# Patient Record
Sex: Female | Born: 1965
Health system: Southern US, Community
[De-identification: ages and names within clinical notes are randomized; demographics above are authoritative.]

## PROBLEM LIST (undated history)

## (undated) DIAGNOSIS — K2970 Gastritis, unspecified, without bleeding: Secondary | ICD-10-CM

## (undated) DIAGNOSIS — J302 Other seasonal allergic rhinitis: Secondary | ICD-10-CM

## (undated) DIAGNOSIS — K589 Irritable bowel syndrome without diarrhea: Secondary | ICD-10-CM

## (undated) DIAGNOSIS — F419 Anxiety disorder, unspecified: Secondary | ICD-10-CM

## (undated) DIAGNOSIS — Z9889 Other specified postprocedural states: Secondary | ICD-10-CM

## (undated) DIAGNOSIS — B9681 Helicobacter pylori [H. pylori] as the cause of diseases classified elsewhere: Secondary | ICD-10-CM

## (undated) DIAGNOSIS — F329 Major depressive disorder, single episode, unspecified: Secondary | ICD-10-CM

## (undated) DIAGNOSIS — K219 Gastro-esophageal reflux disease without esophagitis: Secondary | ICD-10-CM

## (undated) DIAGNOSIS — F32A Depression, unspecified: Secondary | ICD-10-CM

## (undated) DIAGNOSIS — G43909 Migraine, unspecified, not intractable, without status migrainosus: Secondary | ICD-10-CM

## (undated) DIAGNOSIS — I10 Essential (primary) hypertension: Secondary | ICD-10-CM

## (undated) DIAGNOSIS — R011 Cardiac murmur, unspecified: Secondary | ICD-10-CM

## (undated) HISTORY — PX: CHOLECYSTECTOMY: SHX55

## (undated) HISTORY — DX: Migraine, unspecified, not intractable, without status migrainosus: G43.909

## (undated) HISTORY — DX: Cardiac murmur, unspecified: R01.1

## (undated) HISTORY — DX: Other specified postprocedural states: Z98.890

## (undated) HISTORY — DX: Gastritis, unspecified, without bleeding: K29.70

## (undated) HISTORY — PX: ABDOMINAL HYSTERECTOMY: SHX81

## (undated) HISTORY — DX: Helicobacter pylori (H. pylori) as the cause of diseases classified elsewhere: B96.81

## (undated) HISTORY — DX: Other seasonal allergic rhinitis: J30.2

## (undated) HISTORY — DX: Irritable bowel syndrome, unspecified: K58.9

## (undated) HISTORY — DX: Gastro-esophageal reflux disease without esophagitis: K21.9

---

## 1993-12-04 HISTORY — PX: TUBAL LIGATION: SHX77

## 2000-03-30 ENCOUNTER — Other Ambulatory Visit: Admission: RE | Admit: 2000-03-30 | Discharge: 2000-03-30 | Payer: Self-pay | Admitting: Obstetrics and Gynecology

## 2000-04-02 ENCOUNTER — Other Ambulatory Visit: Admission: RE | Admit: 2000-04-02 | Discharge: 2000-04-02 | Payer: Self-pay | Admitting: Obstetrics and Gynecology

## 2000-04-02 ENCOUNTER — Encounter (INDEPENDENT_AMBULATORY_CARE_PROVIDER_SITE_OTHER): Payer: Self-pay | Admitting: Specialist

## 2000-04-09 ENCOUNTER — Ambulatory Visit (HOSPITAL_COMMUNITY): Admission: RE | Admit: 2000-04-09 | Discharge: 2000-04-09 | Payer: Self-pay | Admitting: Obstetrics and Gynecology

## 2000-04-09 ENCOUNTER — Encounter: Payer: Self-pay | Admitting: Obstetrics and Gynecology

## 2000-05-15 ENCOUNTER — Inpatient Hospital Stay (HOSPITAL_COMMUNITY): Admission: RE | Admit: 2000-05-15 | Discharge: 2000-05-17 | Payer: Self-pay | Admitting: Obstetrics and Gynecology

## 2000-05-15 ENCOUNTER — Encounter (INDEPENDENT_AMBULATORY_CARE_PROVIDER_SITE_OTHER): Payer: Self-pay | Admitting: Specialist

## 2000-12-04 HISTORY — PX: PARTIAL HYSTERECTOMY: SHX80

## 2002-12-23 ENCOUNTER — Ambulatory Visit (HOSPITAL_COMMUNITY): Admission: RE | Admit: 2002-12-23 | Discharge: 2002-12-23 | Payer: Self-pay | Admitting: Pulmonary Disease

## 2005-02-27 ENCOUNTER — Ambulatory Visit: Payer: Self-pay | Admitting: Family Medicine

## 2006-02-05 ENCOUNTER — Ambulatory Visit: Payer: Self-pay | Admitting: Family Medicine

## 2006-11-08 ENCOUNTER — Ambulatory Visit: Payer: Self-pay | Admitting: Family Medicine

## 2006-11-12 ENCOUNTER — Ambulatory Visit (HOSPITAL_COMMUNITY): Admission: RE | Admit: 2006-11-12 | Discharge: 2006-11-12 | Payer: Self-pay | Admitting: Family Medicine

## 2006-12-04 DIAGNOSIS — Z9889 Other specified postprocedural states: Secondary | ICD-10-CM

## 2006-12-04 HISTORY — DX: Other specified postprocedural states: Z98.890

## 2007-01-21 ENCOUNTER — Ambulatory Visit: Payer: Self-pay | Admitting: Family Medicine

## 2007-05-09 ENCOUNTER — Ambulatory Visit: Payer: Self-pay | Admitting: Family Medicine

## 2007-06-21 ENCOUNTER — Ambulatory Visit: Payer: Self-pay | Admitting: Family Medicine

## 2007-06-21 ENCOUNTER — Ambulatory Visit (HOSPITAL_COMMUNITY): Admission: RE | Admit: 2007-06-21 | Discharge: 2007-06-21 | Payer: Self-pay | Admitting: Family Medicine

## 2007-06-26 ENCOUNTER — Ambulatory Visit (HOSPITAL_COMMUNITY): Admission: RE | Admit: 2007-06-26 | Discharge: 2007-06-26 | Payer: Self-pay | Admitting: Family Medicine

## 2007-06-27 ENCOUNTER — Ambulatory Visit (HOSPITAL_COMMUNITY): Admission: RE | Admit: 2007-06-27 | Discharge: 2007-06-27 | Payer: Self-pay | Admitting: Oncology

## 2007-07-09 ENCOUNTER — Ambulatory Visit: Payer: Self-pay | Admitting: Family Medicine

## 2007-10-22 ENCOUNTER — Ambulatory Visit: Payer: Self-pay | Admitting: Family Medicine

## 2007-10-22 LAB — CONVERTED CEMR LAB
BUN: 7 mg/dL (ref 6–23)
Basophils Absolute: 0 10*3/uL (ref 0.0–0.1)
CO2: 29 meq/L (ref 19–32)
Cholesterol: 185 mg/dL (ref 0–200)
Glucose, Bld: 82 mg/dL (ref 70–99)
Lymphocytes Relative: 39 % (ref 12–46)
Lymphs Abs: 1.7 10*3/uL (ref 0.7–4.0)
MCHC: 33.7 g/dL (ref 30.0–36.0)
MCV: 91.4 fL (ref 78.0–100.0)
Monocytes Absolute: 0.3 10*3/uL (ref 0.1–1.0)
Monocytes Relative: 7 % (ref 3–12)
Neutro Abs: 2.2 10*3/uL (ref 1.7–7.7)
Neutrophils Relative %: 49 % (ref 43–77)
Total CHOL/HDL Ratio: 3.1
VLDL: 10 mg/dL (ref 0–40)

## 2007-10-29 ENCOUNTER — Encounter: Payer: Self-pay | Admitting: Family Medicine

## 2007-11-08 ENCOUNTER — Ambulatory Visit: Payer: Self-pay | Admitting: Gastroenterology

## 2007-11-13 ENCOUNTER — Ambulatory Visit: Payer: Self-pay | Admitting: Gastroenterology

## 2007-11-13 ENCOUNTER — Ambulatory Visit (HOSPITAL_COMMUNITY): Admission: RE | Admit: 2007-11-13 | Discharge: 2007-11-13 | Payer: Self-pay | Admitting: Gastroenterology

## 2007-11-13 ENCOUNTER — Encounter: Payer: Self-pay | Admitting: Gastroenterology

## 2007-11-13 HISTORY — PX: COLONOSCOPY: SHX174

## 2007-11-15 DIAGNOSIS — J45909 Unspecified asthma, uncomplicated: Secondary | ICD-10-CM | POA: Insufficient documentation

## 2007-12-05 DIAGNOSIS — B9681 Helicobacter pylori [H. pylori] as the cause of diseases classified elsewhere: Secondary | ICD-10-CM

## 2007-12-05 HISTORY — DX: Helicobacter pylori (H. pylori) as the cause of diseases classified elsewhere: B96.81

## 2008-01-16 ENCOUNTER — Ambulatory Visit: Payer: Self-pay | Admitting: Gastroenterology

## 2008-02-05 ENCOUNTER — Ambulatory Visit: Payer: Self-pay | Admitting: Family Medicine

## 2008-02-05 ENCOUNTER — Ambulatory Visit (HOSPITAL_COMMUNITY): Admission: RE | Admit: 2008-02-05 | Discharge: 2008-02-05 | Payer: Self-pay | Admitting: Family Medicine

## 2008-02-05 LAB — CONVERTED CEMR LAB
Basophils Absolute: 0 10*3/uL (ref 0.0–0.1)
CO2: 29 meq/L (ref 19–32)
Chloride: 98 meq/L (ref 96–112)
Creatinine, Ser: 0.8 mg/dL (ref 0.40–1.20)
Eosinophils Absolute: 0 10*3/uL (ref 0.0–0.7)
Eosinophils Relative: 0 % (ref 0–5)
Glucose, Bld: 126 mg/dL — ABNORMAL HIGH (ref 70–99)
HCT: 38 % (ref 36.0–46.0)
Hemoglobin: 13.1 g/dL (ref 12.0–15.0)
MCV: 90 fL (ref 78.0–100.0)
Potassium: 3.8 meq/L (ref 3.5–5.3)
RBC: 4.22 M/uL (ref 3.87–5.11)
RDW: 13.6 % (ref 11.5–15.5)
Sodium: 137 meq/L (ref 135–145)
WBC: 5.5 10*3/uL (ref 4.0–10.5)

## 2008-03-17 ENCOUNTER — Ambulatory Visit: Payer: Self-pay | Admitting: Gastroenterology

## 2008-03-20 ENCOUNTER — Ambulatory Visit: Payer: Self-pay | Admitting: Gastroenterology

## 2008-03-20 ENCOUNTER — Encounter: Payer: Self-pay | Admitting: Gastroenterology

## 2008-03-20 ENCOUNTER — Ambulatory Visit (HOSPITAL_COMMUNITY): Admission: RE | Admit: 2008-03-20 | Discharge: 2008-03-20 | Payer: Self-pay | Admitting: Gastroenterology

## 2008-03-20 HISTORY — PX: ESOPHAGOGASTRODUODENOSCOPY: SHX1529

## 2008-05-13 ENCOUNTER — Ambulatory Visit: Payer: Self-pay | Admitting: Gastroenterology

## 2008-06-11 ENCOUNTER — Ambulatory Visit: Payer: Self-pay | Admitting: Family Medicine

## 2008-07-09 DIAGNOSIS — J301 Allergic rhinitis due to pollen: Secondary | ICD-10-CM | POA: Insufficient documentation

## 2008-10-06 ENCOUNTER — Encounter: Payer: Self-pay | Admitting: Family Medicine

## 2008-10-13 ENCOUNTER — Ambulatory Visit: Payer: Self-pay | Admitting: Family Medicine

## 2008-10-13 DIAGNOSIS — E785 Hyperlipidemia, unspecified: Secondary | ICD-10-CM | POA: Insufficient documentation

## 2008-10-13 DIAGNOSIS — R1013 Epigastric pain: Secondary | ICD-10-CM | POA: Insufficient documentation

## 2008-10-16 ENCOUNTER — Encounter (HOSPITAL_COMMUNITY): Admission: RE | Admit: 2008-10-16 | Discharge: 2008-11-15 | Payer: Self-pay | Admitting: Family Medicine

## 2008-10-21 ENCOUNTER — Encounter: Payer: Self-pay | Admitting: Family Medicine

## 2008-11-19 ENCOUNTER — Ambulatory Visit (HOSPITAL_COMMUNITY): Admission: RE | Admit: 2008-11-19 | Discharge: 2008-11-19 | Payer: Self-pay | Admitting: Family Medicine

## 2009-03-23 ENCOUNTER — Ambulatory Visit: Payer: Self-pay | Admitting: Family Medicine

## 2009-03-24 ENCOUNTER — Encounter: Payer: Self-pay | Admitting: Family Medicine

## 2009-03-24 LAB — CONVERTED CEMR LAB
Calcium: 9 mg/dL (ref 8.4–10.5)
Chloride: 106 meq/L (ref 96–112)
Creatinine, Ser: 0.79 mg/dL (ref 0.40–1.20)
Sodium: 142 meq/L (ref 135–145)
Triglycerides: 77 mg/dL (ref <150)

## 2009-07-26 ENCOUNTER — Ambulatory Visit: Payer: Self-pay | Admitting: Family Medicine

## 2009-07-26 DIAGNOSIS — R5383 Other fatigue: Secondary | ICD-10-CM

## 2009-07-26 DIAGNOSIS — R634 Abnormal weight loss: Secondary | ICD-10-CM | POA: Insufficient documentation

## 2009-07-26 DIAGNOSIS — R5381 Other malaise: Secondary | ICD-10-CM | POA: Insufficient documentation

## 2009-07-27 LAB — CONVERTED CEMR LAB
Basophils Absolute: 0 10*3/uL (ref 0.0–0.1)
Basophils Relative: 1 % (ref 0–1)
Eosinophils Absolute: 0.3 10*3/uL (ref 0.0–0.7)
Lymphocytes Relative: 37 % (ref 12–46)
MCV: 90.7 fL (ref 78.0–100.0)
Monocytes Relative: 9 % (ref 3–12)
Neutrophils Relative %: 46 % (ref 43–77)
Platelets: 177 10*3/uL (ref 150–400)
RBC: 4.51 M/uL (ref 3.87–5.11)
RDW: 13.2 % (ref 11.5–15.5)
TSH: 1.302 microintl units/mL (ref 0.350–4.500)
WBC: 4.4 10*3/uL (ref 4.0–10.5)

## 2009-08-27 ENCOUNTER — Encounter: Payer: Self-pay | Admitting: Family Medicine

## 2009-10-13 ENCOUNTER — Encounter: Payer: Self-pay | Admitting: Family Medicine

## 2009-10-25 ENCOUNTER — Ambulatory Visit (HOSPITAL_COMMUNITY): Admission: RE | Admit: 2009-10-25 | Discharge: 2009-10-25 | Payer: Self-pay | Admitting: General Surgery

## 2009-11-09 ENCOUNTER — Encounter: Payer: Self-pay | Admitting: Family Medicine

## 2009-11-30 ENCOUNTER — Telehealth: Payer: Self-pay | Admitting: Family Medicine

## 2009-12-20 ENCOUNTER — Ambulatory Visit: Payer: Self-pay | Admitting: Family Medicine

## 2009-12-23 ENCOUNTER — Ambulatory Visit (HOSPITAL_COMMUNITY): Admission: RE | Admit: 2009-12-23 | Discharge: 2009-12-23 | Payer: Self-pay | Admitting: Family Medicine

## 2009-12-26 DIAGNOSIS — R7302 Impaired glucose tolerance (oral): Secondary | ICD-10-CM | POA: Insufficient documentation

## 2009-12-29 ENCOUNTER — Ambulatory Visit (HOSPITAL_COMMUNITY): Admission: RE | Admit: 2009-12-29 | Discharge: 2009-12-29 | Payer: Self-pay | Admitting: Family Medicine

## 2009-12-29 ENCOUNTER — Encounter: Payer: Self-pay | Admitting: Family Medicine

## 2009-12-30 ENCOUNTER — Encounter: Payer: Self-pay | Admitting: Family Medicine

## 2010-01-10 ENCOUNTER — Telehealth: Payer: Self-pay | Admitting: Family Medicine

## 2010-01-11 ENCOUNTER — Emergency Department (HOSPITAL_COMMUNITY): Admission: EM | Admit: 2010-01-11 | Discharge: 2010-01-11 | Payer: Self-pay | Admitting: Emergency Medicine

## 2010-01-19 ENCOUNTER — Ambulatory Visit: Payer: Self-pay | Admitting: Family Medicine

## 2010-01-19 DIAGNOSIS — J019 Acute sinusitis, unspecified: Secondary | ICD-10-CM

## 2010-01-19 DIAGNOSIS — G43909 Migraine, unspecified, not intractable, without status migrainosus: Secondary | ICD-10-CM | POA: Insufficient documentation

## 2010-01-19 DIAGNOSIS — B9689 Other specified bacterial agents as the cause of diseases classified elsewhere: Secondary | ICD-10-CM | POA: Insufficient documentation

## 2010-01-20 ENCOUNTER — Encounter: Payer: Self-pay | Admitting: Family Medicine

## 2010-03-01 ENCOUNTER — Ambulatory Visit: Payer: Self-pay | Admitting: Family Medicine

## 2010-03-01 ENCOUNTER — Encounter: Payer: Self-pay | Admitting: Physician Assistant

## 2010-04-19 ENCOUNTER — Ambulatory Visit: Payer: Self-pay | Admitting: Family Medicine

## 2010-04-19 ENCOUNTER — Encounter (INDEPENDENT_AMBULATORY_CARE_PROVIDER_SITE_OTHER): Payer: Self-pay

## 2010-05-02 DIAGNOSIS — J209 Acute bronchitis, unspecified: Secondary | ICD-10-CM | POA: Insufficient documentation

## 2010-06-17 ENCOUNTER — Emergency Department (HOSPITAL_COMMUNITY): Admission: EM | Admit: 2010-06-17 | Discharge: 2010-06-17 | Payer: Self-pay | Admitting: Emergency Medicine

## 2010-06-24 ENCOUNTER — Encounter: Payer: Self-pay | Admitting: Physician Assistant

## 2010-06-24 ENCOUNTER — Ambulatory Visit: Payer: Self-pay | Admitting: Family Medicine

## 2010-06-27 ENCOUNTER — Encounter: Payer: Self-pay | Admitting: Physician Assistant

## 2010-06-27 ENCOUNTER — Ambulatory Visit: Payer: Self-pay | Admitting: Family Medicine

## 2010-09-14 ENCOUNTER — Ambulatory Visit: Payer: Self-pay | Admitting: Family Medicine

## 2010-09-14 DIAGNOSIS — M949 Disorder of cartilage, unspecified: Secondary | ICD-10-CM

## 2010-09-14 DIAGNOSIS — M899 Disorder of bone, unspecified: Secondary | ICD-10-CM | POA: Insufficient documentation

## 2010-10-03 ENCOUNTER — Encounter: Payer: Self-pay | Admitting: Family Medicine

## 2010-10-10 ENCOUNTER — Encounter: Payer: Self-pay | Admitting: Family Medicine

## 2010-10-24 ENCOUNTER — Emergency Department (HOSPITAL_COMMUNITY)
Admission: EM | Admit: 2010-10-24 | Discharge: 2010-10-25 | Payer: Self-pay | Source: Home / Self Care | Admitting: Emergency Medicine

## 2010-11-03 ENCOUNTER — Telehealth: Payer: Self-pay | Admitting: Family Medicine

## 2010-12-19 ENCOUNTER — Ambulatory Visit
Admission: RE | Admit: 2010-12-19 | Discharge: 2010-12-19 | Payer: Self-pay | Source: Home / Self Care | Attending: Urgent Care | Admitting: Urgent Care

## 2010-12-19 DIAGNOSIS — K589 Irritable bowel syndrome without diarrhea: Secondary | ICD-10-CM | POA: Insufficient documentation

## 2010-12-25 ENCOUNTER — Encounter: Payer: Self-pay | Admitting: Family Medicine

## 2011-01-02 ENCOUNTER — Ambulatory Visit (HOSPITAL_COMMUNITY)
Admission: RE | Admit: 2011-01-02 | Discharge: 2011-01-02 | Payer: Self-pay | Source: Home / Self Care | Attending: Family Medicine | Admitting: Family Medicine

## 2011-01-03 NOTE — Letter (Signed)
Summary: med review sheet  med review sheet   Imported By: Rudene Anda 09/14/2010 08:50:09  _____________________________________________________________________  External Attachment:    Type:   Image     Comment:   External Document

## 2011-01-03 NOTE — Progress Notes (Signed)
Summary: ALLERGY & ASTHMA  ALLERGY & ASTHMA   Imported By: Lind Guest 01/25/2010 09:34:07  _____________________________________________________________________  External Attachment:    Type:   Image     Comment:   External Document

## 2011-01-03 NOTE — Assessment & Plan Note (Signed)
Summary: follow up asthma- room 1   Vital Signs:  Patient profile:   45 year old female Menstrual status:  regular Height:      65.5 inches Weight:      125.75 pounds BMI:     20.68 O2 Sat:      99 % on Room air Pulse rate:   66 / minute Resp:     16 per minute BP sitting:   144 / 90  (left arm)  Vitals Entered By: Adella Hare LPN (June 27, 2010 8:43 AM) CC: follow up asthma Is Patient Diabetic? No Pain Assessment Patient in pain? no      Comments did not bring meds to ov   Primary Provider:  Syliva Overman MD  CC:  follow up asthma.  History of Present Illness: Pt is here for f/u on her asthma exacerbation. She states she is doing better.  Is not awakening at night now due to breathing.  She is still using her Albuterol every 4 hrs though she no longer feels like she needs to.  She is using her Symbicort two times a day as prescribed.  She feels that she is able to rtw tonight as planned.     Allergies (verified): No Known Drug Allergies  Review of Systems ENT:  Denies nasal congestion, postnasal drainage, and sinus pressure. CV:  Denies chest pain or discomfort. Resp:  Denies cough, shortness of breath, and wheezing.  Physical Exam  General:  Well-developed,well-nourished,in no acute distress; alert,appropriate and cooperative throughout examination Head:  Normocephalic and atraumatic without obvious abnormalities. No apparent alopecia or balding. Ears:  External ear exam shows no significant lesions or deformities.  Otoscopic examination reveals clear canals, tympanic membranes are intact bilaterally without bulging, retraction, inflammation or discharge. Hearing is grossly normal bilaterally. Nose:  External nasal examination shows no deformity or inflammation. Nasal mucosa are pink and moist without lesions or exudates. Mouth:  Oral mucosa and oropharynx without lesions or exudates.  Teeth in good repair. Neck:  No deformities, masses, or tenderness  noted. Lungs:  Normal respiratory effort, chest expands symmetrically. Lungs are clear to auscultation, no crackles or wheezes. Heart:  Normal rate and regular rhythm. S1 and S2 normal without gallop, murmur, click, rub or other extra sounds. Cervical Nodes:  No lymphadenopathy noted Psych:  Cognition and judgment appear intact. Alert and cooperative with normal attention span and concentration. No apparent delusions, illusions, hallucinations   Impression & Recommendations:  Problem # 1:  ASTHMA, WITH ACUTE EXACERBATION (ICD-493.92) Assessment Improved Resume using Proventil as needed.  Her updated medication list for this problem includes:    Proventil Hfa 108 (90 Base) Mcg/act Aers (Albuterol sulfate) .Marland Kitchen..Marland Kitchen Two puffs every 6-8 hours as needed    Symbicort 160-4.5 Mcg/act Aero (Budesonide-formoterol fumarate) .Marland Kitchen... 2 puffs twice daily    Singulair 10 Mg Tabs (Montelukast sodium) .Marland Kitchen... Take 1 tablet by mouth once a day    Prednisone 20 Mg Tabs (Prednisone) .Marland Kitchen... Take 2 daily x 4 days  Complete Medication List: 1)  Proventil Hfa 108 (90 Base) Mcg/act Aers (Albuterol sulfate) .... Two puffs every 6-8 hours as needed 2)  Veramyst 27.5 Mcg/spray Susp (Fluticasone furoate) .... Two sprays each nostril bid 3)  Symbicort 160-4.5 Mcg/act Aero (Budesonide-formoterol fumarate) .... 2 puffs twice daily 4)  Singulair 10 Mg Tabs (Montelukast sodium) .... Take 1 tablet by mouth once a day 5)  Zyprexa 2.5 Mg Tabs (Olanzapine) .... Take 1 tab by mouth at bedtime 6)  Ibuprofen 600  Mg Tabs (Ibuprofen) .... One twicwe daily as needed for severe headache 7)  Tessalon Perles 100 Mg Caps (Benzonatate) .... Take 1 capsule by mouth three times a day 8)  Prednisone 20 Mg Tabs (Prednisone) .... Take 2 daily x 4 days  Patient Instructions: 1)  Keep your next appt with Dr Lodema Hong.  We will see you sooner if your asthma syptoms begin to worsen again. 2)  Return to using your Albuterol inhaler as needed. 3)   Continue using Symbicort two times a day.

## 2011-01-03 NOTE — Assessment & Plan Note (Signed)
Summary: office visit   Vital Signs:  Patient profile:   45 year old female Menstrual status:  regular Height:      65.5 inches Weight:      116 pounds BMI:     19.08 O2 Sat:      98 % Pulse rate:   68 / minute Pulse rhythm:   regular Resp:     16 per minute BP sitting:   140 / 90  (left arm) Cuff size:   regular  Vitals Entered By: Everitt Amber LPN (Apr 19, 2010 10:15 AM) CC: Follow up chornic problems, keeps losing weight even though she tries to gain   Primary Care Provider:  Syliva Overman MD  CC:  Follow up chornic problems and keeps losing weight even though she tries to gain.  History of Present Illness: P tcomes in concerned about persistent weight loss with symptoms of depression. She is neither suicidal or homicidal and denies hallucinations. She has no change in bowel movements. She has increased wheeze , and cough productive of clearsputum over the past several days.She has had t no fever or chills   Current Medications (verified): 1)  Proventil Hfa 108 (90 Base) Mcg/act Aers (Albuterol Sulfate) .... Two Puffs Every 6-8 Hours As Needed 2)  Veramyst 27.5 Mcg/spray Susp (Fluticasone Furoate) .... Two Sprays Each Nostril Bid 3)  Symbicort 160-4.5 Mcg/act Aero (Budesonide-Formoterol Fumarate) .... 2 Puffs Twice Daily 4)  Singulair 10 Mg Tabs (Montelukast Sodium) .... Take 1 Tablet By Mouth Once A Day  Allergies (verified): No Known Drug Allergies  Review of Systems      See HPI General:  Complains of sleep disorder; poor sleep  x 2 weeks on avg less than 4 hrs/night. Eyes:  Denies blurring and discharge. ENT:  Denies hoarseness, nasal congestion, postnasal drainage, and sinus pressure. CV:  Denies chest pain or discomfort, palpitations, and swelling of feet. Resp:  Complains of cough, shortness of breath, sputum productive, and wheezing. GI:  Denies abdominal pain, constipation, diarrhea, nausea, and vomiting. GU:  Denies dysuria and urinary frequency. MS:   Denies joint pain and stiffness. Neuro:  Complains of headaches; right sided throbbing headache with nausea which wa disabling on this past Sunday, May 15 , had to lv wk and has not been back yet, has used advil with some relieft , needs wk excuse to return tomorrow, has been wking 7 days straight for several mths. Psych:  poor sleep, feeling depressed eating all day has bm constantly since cholecystectomy, , wants med to help her regain weight. Endo:  Denies cold intolerance, excessive hunger, and excessive thirst. Heme:  Denies abnormal bruising and bleeding. Allergy:  Denies hives or rash and itching eyes.  Physical Exam  General:  Well-developed,well-nourished,in no acute distress; alert,appropriate and cooperative throughout examination HEENT: No facial asymmetry,  EOMI, No sinus tenderness, TM's Clear, oropharynx  pink and moist.   Chest:decreased air entry, bilateral wheezes and crackles CVS: S1, S2, No murmurs, No S3.   Abd: Soft, Nontender.  MS: Adequate ROM spine, hips, shoulders and knees.  Ext: No edema.   CNS: CN 2-12 intact, power tone and sensation normal throughout.   Skin: Intact, no visible lesions or rashes.  Psych: Good eye contact, normal affect.  Memory intact, depressed appearing.    Impression & Recommendations:  Problem # 1:  ASTHMA (ICD-493.90) Assessment Deteriorated  Her updated medication list for this problem includes:    Proventil Hfa 108 (90 Base) Mcg/act Aers (Albuterol sulfate) .Marland Kitchen..Marland Kitchen Two  puffs every 6-8 hours as needed    Symbicort 160-4.5 Mcg/act Aero (Budesonide-formoterol fumarate) .Marland Kitchen... 2 puffs twice daily    Singulair 10 Mg Tabs (Montelukast sodium) .Marland Kitchen... Take 1 tablet by mouth once a day  Problem # 2:  ACUTE BRONCHITIS (ICD-466.0) Assessment: Deteriorated  Her updated medication list for this problem includes:    Proventil Hfa 108 (90 Base) Mcg/act Aers (Albuterol sulfate) .Marland Kitchen..Marland Kitchen Two puffs every 6-8 hours as needed    Symbicort 160-4.5  Mcg/act Aero (Budesonide-formoterol fumarate) .Marland Kitchen... 2 puffs twice daily    Singulair 10 Mg Tabs (Montelukast sodium) .Marland Kitchen... Take 1 tablet by mouth once a day    Tessalon Perles 100 Mg Caps (Benzonatate) .Marland Kitchen... Take 1 capsule by mouth three times a day  Problem # 3:  WEIGHT LOSS (ICD-783.21) Assessment: Deteriorated has responded in the past to zyprexa and does have some depression symptoms  Complete Medication List: 1)  Proventil Hfa 108 (90 Base) Mcg/act Aers (Albuterol sulfate) .... Two puffs every 6-8 hours as needed 2)  Veramyst 27.5 Mcg/spray Susp (Fluticasone furoate) .... Two sprays each nostril bid 3)  Symbicort 160-4.5 Mcg/act Aero (Budesonide-formoterol fumarate) .... 2 puffs twice daily 4)  Singulair 10 Mg Tabs (Montelukast sodium) .... Take 1 tablet by mouth once a day 5)  Zyprexa 2.5 Mg Tabs (Olanzapine) .... Take 1 tab by mouth at bedtime 6)  Ibuprofen 600 Mg Tabs (Ibuprofen) .... One twicwe daily as needed for severe headache 7)  Tessalon Perles 100 Mg Caps (Benzonatate) .... Take 1 capsule by mouth three times a day  Patient Instructions: 1)  Please schedule a follow-up appointment in 3 months. 2)  You are being treated for acute broncitis, uncontrolled allergies, and asthma flare. 3)  Pls call ifyousdevelop cills , fever or yellow sputum in the next week. 4)  Meds are sent in and you will be providied with a work excuse to return tomorrow. 5)  Get plenty of rest, drink lots of clear liquids, and use Tylenol or Ibuprofen for fever and comfort. Return in 7-10 days if you're not better:sooner if you're feeling worse. Prescriptions: TESSALON PERLES 100 MG CAPS (BENZONATATE) Take 1 capsule by mouth three times a day  #21 x 0   Entered and Authorized by:   Syliva Overman MD   Signed by:   Syliva Overman MD on 04/19/2010   Method used:   Electronically to        [facility] [phone]* (retail)       [address]       [place], [place]  [address]        Ph: [phone] or [phone]       Fax: [phone]   RxID:   [id number] IBUPROFEN 600 MG TABS (IBUPROFEN) one twicwe daily as needed for severe headache  #40 x 0   Entered and Authorized by:   Syliva Overman MD   Signed by:   Syliva Overman MD on 04/19/2010   Method used:   Electronically to        [facility] [phone]* (retail)       [address]       [place], [place]  [address]       Ph: [phone] or [phone]       Fax: [phone]   RxID:   [id number] PREDNISONE (PAK) 5 MG TABS (PREDNISONE) Use as directed  #21 x 0   Entered  and Authorized by:   Syliva Overman MD   Signed by:   Syliva Overman MD on 04/19/2010   Method used:   Electronically to        [facility] [phone]* (retail)       [address]       [place], [place]  [address]       Ph: [phone] or [phone]       Fax: [phone]   RxID:   [id number] ZYPREXA 2.5 MG TABS (OLANZAPINE) Take 1 tab by mouth at bedtime  #30 x 2   Entered and Authorized by:   Syliva Overman MD   Signed by:   Syliva Overman MD on 04/19/2010   Method used:   Electronically to        [facility] [phone]* (retail)       [address]       [place], [place]  [address]       Ph: [phone] or [phone]       Fax: [phone]   RxID:   [id number]

## 2011-01-03 NOTE — Letter (Signed)
Summary: Out of Work  [facility]  [address]   [place], [place] [address]   Phone: [phone]  Fax: [phone]    June 24, 2010   Employee:  TANZIE ROTHSCHILD    To Whom It May Concern:   For Medical reasons, please excuse the above named employee from work for the following dates:  Start:   06-23-10  End:   06-27-10 may return to work  If you need additional information, please feel free to contact our office.         Sincerely,    Esperanza Sheets PA

## 2011-01-03 NOTE — Assessment & Plan Note (Signed)
Summary: asthma flare up- room 1   Vital Signs:  Patient profile:   45 year old female Menstrual status:  regular Height:      65.5 inches Weight:      122.75 pounds BMI:     20.19 O2 Sat:      97 % on Room air Pulse rate:   72 / minute Resp:     16 per minute BP sitting:   146 / 90  (left arm)  Vitals Entered By: Adella Hare LPN (June 24, 2010 9:06 AM) CC: asthma flare up Is Patient Diabetic? No Pain Assessment Patient in pain? no        Primary Provider:  Syliva Overman MD  CC:  asthma flare up.  History of Present Illness: Pt presents today with c/o asthma flare up.  She states she was at work last Fri 7/15 and had an asthma attack. She used her inhaler 3 times without improvement so went to the ER.  They prescribed Prednisone 20mg  once daily x 5 days and Zpak. Pt states she still is having an increase in wheezing and difficulty breathing.  She is using her Proventil inhaler 2-3 times a day as needed.  No fever or chills.  No allergy syptoms. No sinus congestion, pressure, or chest congestion. Hx of asthma flare with certain smoke exposures at work, but hasnt been exposed recently.  Current Medications (verified): 1)  Proventil Hfa 108 (90 Base) Mcg/act Aers (Albuterol Sulfate) .... Two Puffs Every 6-8 Hours As Needed 2)  Veramyst 27.5 Mcg/spray Susp (Fluticasone Furoate) .... Two Sprays Each Nostril Bid 3)  Symbicort 160-4.5 Mcg/act Aero (Budesonide-Formoterol Fumarate) .... 2 Puffs Twice Daily 4)  Singulair 10 Mg Tabs (Montelukast Sodium) .... Take 1 Tablet By Mouth Once A Day 5)  Zyprexa 2.5 Mg Tabs (Olanzapine) .... Take 1 Tab By Mouth At Bedtime 6)  Ibuprofen 600 Mg Tabs (Ibuprofen) .... One Twicwe Daily As Needed For Severe Headache 7)  Tessalon Perles 100 Mg Caps (Benzonatate) .... Take 1 Capsule By Mouth Three Times A Day  Allergies (verified): No Known Drug Allergies  Past History:  Past medical history reviewed for relevance to current acute and chronic  problems.  Past Medical History: Reviewed history from 01/19/2010 and no changes required. Current Problems:  ASTHMA (ICD-493.90)  dx 2008 ALLERGIC RHINITIS, SEASONAL (ICD-477.0) Migraine headaches, initially at age 56, recurrence for the first time in 10 yrs in 2011  Review of Systems General:  Denies chills and fever. ENT:  Denies earache, nasal congestion, and sore throat. CV:  Denies chest pain or discomfort. Resp:  Complains of shortness of breath and wheezing; denies cough and sputum productive. Allergy:  Complains of sneezing; denies itching eyes and seasonal allergies; SNEEZES EVERY MORNING WHEN AWAKENS, NO CHANGE.  Physical Exam  General:  Well-developed,well-nourished,in no acute distress; alert,appropriate and cooperative throughout examination Head:  Normocephalic and atraumatic without obvious abnormalities. No apparent alopecia or balding. Ears:  External ear exam shows no significant lesions or deformities.  Otoscopic examination reveals clear canals, tympanic membranes are intact bilaterally without bulging, retraction, inflammation or discharge. Hearing is grossly normal bilaterally. Nose:  Turbs bilat severly swollen.no external deformity, no nasal discharge, and no sinus percussion tenderness.   Mouth:  Oral mucosa and oropharynx without lesions or exudates.  Teeth in good repair. Neck:  No deformities, masses, or tenderness noted. Lungs:  normal respiratory effort and no intercostal retractions.  Tight bilat without wheeze, rhonci or rales.  After NMT CTA with good  a/e. Heart:  Normal rate and regular rhythm. S1 and S2 normal without gallop, murmur, click, rub or other extra sounds. Cervical Nodes:  No lymphadenopathy noted Psych:  Cognition and judgment appear intact. Alert and cooperative with normal attention span and concentration. No apparent delusions, illusions, hallucinations   Impression & Recommendations:  Problem # 1:  ASTHMA, WITH ACUTE EXACERBATION  (ICD-493.92) Assessment Deteriorated  Her updated medication list for this problem includes:    Proventil Hfa 108 (90 Base) Mcg/act Aers (Albuterol sulfate) .Marland Kitchen..Marland Kitchen Two puffs every 6-8 hours as needed    Symbicort 160-4.5 Mcg/act Aero (Budesonide-formoterol fumarate) .Marland Kitchen... 2 puffs twice daily    Singulair 10 Mg Tabs (Montelukast sodium) .Marland Kitchen... Take 1 tablet by mouth once a day    Prednisone 20 Mg Tabs (Prednisone) .Marland Kitchen... Take 2 daily x 4 days  Orders: Albuterol Sulfate Sol 1mg  unit dose (E4540) Nebulizer Tx (98119)  Problem # 2:  ALLERGIC RHINITIS, SEASONAL (ICD-477.0) Assessment: Unchanged Encouraged pt to use Veramyst regularly.  Complete Medication List: 1)  Proventil Hfa 108 (90 Base) Mcg/act Aers (Albuterol sulfate) .... Two puffs every 6-8 hours as needed 2)  Veramyst 27.5 Mcg/spray Susp (Fluticasone furoate) .... Two sprays each nostril bid 3)  Symbicort 160-4.5 Mcg/act Aero (Budesonide-formoterol fumarate) .... 2 puffs twice daily 4)  Singulair 10 Mg Tabs (Montelukast sodium) .... Take 1 tablet by mouth once a day 5)  Zyprexa 2.5 Mg Tabs (Olanzapine) .... Take 1 tab by mouth at bedtime 6)  Ibuprofen 600 Mg Tabs (Ibuprofen) .... One twicwe daily as needed for severe headache 7)  Tessalon Perles 100 Mg Caps (Benzonatate) .... Take 1 capsule by mouth three times a day 8)  Prednisone 20 Mg Tabs (Prednisone) .... Take 2 daily x 4 days  Patient Instructions: 1)  Follow up appt Monday. 2)  I have prescribed a stronger dose of Prednisone x 4 days. 3)  Continue Symbicort 2 puffs two times a day. 4)  Use your Proventil Inhaler 2 puffs every 4 hrs for the next 2-3 days until you syptoms improve.  Then resume as needed use. Prescriptions: PREDNISONE 20 MG TABS (PREDNISONE) take 2 daily x 4 days  #8 x 0   Entered and Authorized by:   Esperanza Sheets PA   Signed by:   Esperanza Sheets PA on 06/24/2010   Method used:   Electronically to        [facility] [phone]* (retail)       [address]       [place], [place]  [address]       Ph: [phone] or [phone]       Fax: [phone]   RxID:   [id number]    Medication Administration  Medication # 1:    Medication: Albuterol Sulfate Sol 1mg  unit dose    Diagnosis: ASTHMA, WITH ACUTE EXACERBATION (ICD-493.92)    Dose: 2.5mg     Route: inhaled    Exp Date: 8/12    Lot #: [id number]    Mfr: nephron pharm    Patient tolerated medication without complications    Given by: Adella Hare LPN (June 24, 2010 11:10 AM)  Orders Added: 1)  Albuterol Sulfate Sol 1mg  unit dose [J7613] 2)  Nebulizer Tx [94640] 3)  Est. Patient Level IV [32951]

## 2011-01-03 NOTE — Progress Notes (Signed)
Summary: bp is up  Phone Note Call from Patient   Summary of Call: pt states that her bp is up 169/70 and had headache for 2days [phone] Initial call taken by: Rudene Anda,  January 10, 2010 3:18 PM  Follow-up for Phone Call        Pt does not have hx of HTN when I reviewed her chart.  Please make sure she is not having any visual changes, N/V, numbess, or tingling.  If she is then she needs to go to the ER.  Has she tried anything for the HA?  How severe is the HA (1-10)? Follow-up by: Esperanza Sheets PA,  January 10, 2010 4:54 PM  Additional Follow-up for Phone Call Additional follow up Details #1::        pls give appt in am for md visit Additional Follow-up by: Syliva Overman MD,  January 11, 2010 12:24 PM    Additional Follow-up for Phone Call Additional follow up Details #2::    LEFT MESSAGE TO COME IN TOMORROW AT 9:00 Follow-up by: Lind Guest,  January 11, 2010 1:57 PM

## 2011-01-03 NOTE — Letter (Signed)
Summary: FMLA PAPER  FMLA PAPER   Imported By: Lind Guest 10/03/2010 10:06:53  _____________________________________________________________________  External Attachment:    Type:   Image     Comment:   External Document

## 2011-01-03 NOTE — Letter (Signed)
Summary: Out of Work  [facility]  [address]   [place], [place] [address]   Phone: [phone]  Fax: [phone]    June 27, 2010   Employee:  Hannah Hays    To Whom It May Concern:   For Medical reasons, please excuse the above named employee from work for the following dates:  Start:   06-23-10  End:   06-27-10 may return to work without restrictions  If you need additional information, please feel free to contact our office.         Sincerely,    Esperanza Sheets PA

## 2011-01-03 NOTE — Letter (Signed)
Summary: TAKE HOME INSTRUCTIONS  TAKE HOME INSTRUCTIONS   Imported By: Lind Guest 01/20/2010 13:07:50  _____________________________________________________________________  External Attachment:    Type:   Image     Comment:   External Document

## 2011-01-03 NOTE — Letter (Signed)
Summary: imaging appt at breast center  imaging appt at breast center   Imported By: Rudene Anda 12/29/2009 15:24:35  _____________________________________________________________________  External Attachment:    Type:   Image     Comment:   External Document

## 2011-01-03 NOTE — Letter (Signed)
Summary: Work Excuse  [facility]  [address]   [place], [place] [address]   Phone: [phone]  Fax: [phone]    Today's Date: March 01, 2010  Name of Patient: Hannah Hays  The above named patient had a medical visit today at:  9 am   Please take this into consideration when reviewing the time away from work/school.    Special Instructions:  [  ] None  [  ] To be off the remainder of today, returning to the normal work / school schedule tomorrow.  [  ] To be off until the next scheduled appointment on ______________________.  [ X ] Other _____Excuse from work on 02/28/10 due to Asthma attack._May return to work on 03/01/10 without restriction  Sincerely yours,   Esperanza Sheets PA

## 2011-01-03 NOTE — Progress Notes (Signed)
  Phone Note Other Incoming   Caller: dr simpson Summary of Call: pls let pt know I reviewed her labs.  nOTE if she was not fasting, then that explains the cholesterol  217 total, but the ldl is146, so still needs to follow low fat info  Please advise pt the cholesterol is elevated. They need to increase fresh or frozen fruit and vegetable intake, reduce red meats max twice weekly, increase white meat, baked, grilled or broiled eg fish, [place] breast, chicken breast.  Egg white boiled is an excellent source of protein and beans cooked without butter or oil. Work towards exercising at least 5 days/week for 30 minutes each session, to improve the good cholesterol. Cut back on butter, cheese, oils and the yellow of eggs, preferably drink 1% or skimmed milk. The healthy oils are olive and canola, use in moderation; healthiest "butter" substitute is smart balance.  Initial call taken by: Syliva Overman MD,  November 03, 2010 1:06 PM  Follow-up for Phone Call        called patient, left message Follow-up by: Adella Hare LPN,  November 03, 2010 4:23 PM  Additional Follow-up for Phone Call Additional follow up Details #1::        patient aware Additional Follow-up by: Adella Hare LPN,  November 03, 2010 4:43 PM

## 2011-01-03 NOTE — Letter (Signed)
Summary: Out of Work  [facility]  [address]   [place], [place] [address]   Phone: [phone]  Fax: [phone]    Apr 19, 2010   Employee:  Hannah Hays    To Whom It May Concern:   For Medical reasons, please excuse the above named employee from work for the following dates:  Start:   04/17/2010  End:   04/20/2010 To return without restrictions  If you need additional information, please feel free to contact our office.         Sincerely,    Milus Mallick. Lodema Hong, M.D.

## 2011-01-03 NOTE — Assessment & Plan Note (Signed)
Summary: asthma flare up- room 1   Vital Signs:  Patient profile:   45 year old female Menstrual status:  regular Height:      65.5 inches Weight:      121.75 pounds BMI:     20.02 O2 Sat:      98 % on Room air Pulse rate:   68 / minute Resp:     16 per minute BP sitting:   146 / 90  (left arm)  Vitals Entered By: Adella Hare LPN (March 01, 2010 9:19 AM) CC: asthma flare up Is Patient Diabetic? No Pain Assessment Patient in pain? no        Primary Provider:  Syliva Overman MD  CC:  asthma flare up.  History of Present Illness: Pt is here today with c/o asthma flare up yesterday. States the "hot & spicy" at work set her off.  Has done this is the past too.  Also "smoke". Has been using Symbicort two times a day.  Has albuterol for rescue. Has appt with asthma dr in 2 wks.  States when she pulled in the parking lot yest she could smell the "hot & spicy".  She took 2 add'l puffs of her Symbicort.  Went into work.  Then started having probs breathing so used her Proventil inhaler.  Tried to continue working but her syptoms worsened so she had to leave.  Has FMLA for this.  Is feeling fine today.  Current Medications (verified): 1)  Proventil Hfa 108 (90 Base) Mcg/act Aers (Albuterol Sulfate) .... Two Puffs Every 6-8 Hours As Needed 2)  Veramyst 27.5 Mcg/spray Susp (Fluticasone Furoate) .... Two Sprays Each Nostril Bid 3)  Symbicort 160-4.5 Mcg/act Aero (Budesonide-Formoterol Fumarate) .... 2 Puffs Twice Daily 4)  Singulair 10 Mg Tabs (Montelukast Sodium) .... Take 1 Tablet By Mouth Once A Day 5)  Megace Oral 40 Mg/ml Susp (Megestrol Acetate) .... One Teaspoon  Every Day 6)  Isometheptene-Apap-Dichloral 65-325-100 Mg Caps (Apap-Isometheptene-Dichloral) .... One Every 3-4 Hours As Needed  Allergies (verified): No Known Drug Allergies  Review of Systems General:  Denies chills and fever. ENT:  Complains of nasal congestion; denies earache, postnasal drainage, sinus  pressure, and sore throat. CV:  Denies chest pain or discomfort. Resp:  Denies cough, shortness of breath, sputum productive, and wheezing. Allergy:  Complains of seasonal allergies.  Physical Exam  General:  Well-developed,well-nourished,in no acute distress; alert,appropriate and cooperative throughout examination Head:  Normocephalic and atraumatic without obvious abnormalities. No apparent alopecia or balding. Ears:  External ear exam shows no significant lesions or deformities.  Otoscopic examination reveals clear canals, tympanic membranes are intact bilaterally without bulging, retraction, inflammation or discharge. Hearing is grossly normal bilaterally. Nose:  no external deformity.  Bilat nasal turbs severly swollen & pale. Mouth:  Oral mucosa and oropharynx without lesions or exudates.  Teeth in good repair. Neck:  No deformities, masses, or tenderness noted. Lungs:  Normal respiratory effort, chest expands symmetrically. Lungs are clear to auscultation, no crackles or wheezes. Heart:  Normal rate and regular rhythm. S1 and S2 normal without gallop, murmur, click, rub or other extra sounds. Cervical Nodes:  No lymphadenopathy noted Psych:  Cognition and judgment appear intact. Alert and cooperative with normal attention span and concentration. No apparent delusions, illusions, hallucinations   Impression & Recommendations:  Problem # 1:  ASTHMA (ICD-493.90) Assessment Deteriorated Discussed with pt Symbicort is two times a day only.  That this is for prevention & not for rescue.  When she  is at work is anticipates a breathing issue or starts to have syptoms needs to use the Proventil inhaler.  Her updated medication list for this problem includes:    Proventil Hfa 108 (90 Base) Mcg/act Aers (Albuterol sulfate) .Marland Kitchen..Marland Kitchen Two puffs every 6-8 hours as needed    Symbicort 160-4.5 Mcg/act Aero (Budesonide-formoterol fumarate) .Marland Kitchen... 2 puffs twice daily    Singulair 10 Mg Tabs (Montelukast  sodium) .Marland Kitchen... Take 1 tablet by mouth once a day  Orders: Depo- Medrol 80mg  (J1040) Admin of Therapeutic Inj  intramuscular or subcutaneous (04540)  Problem # 2:  ALLERGIC RHINITIS, SEASONAL (ICD-477.0) Assessment: Deteriorated Pt to restart her nasal spray.  To use daily. Discussed may need to take antihistamine too.  Orders: Depo- Medrol 80mg  (J1040)  Complete Medication List: 1)  Proventil Hfa 108 (90 Base) Mcg/act Aers (Albuterol sulfate) .... Two puffs every 6-8 hours as needed 2)  Veramyst 27.5 Mcg/spray Susp (Fluticasone furoate) .... Two sprays each nostril bid 3)  Symbicort 160-4.5 Mcg/act Aero (Budesonide-formoterol fumarate) .... 2 puffs twice daily 4)  Singulair 10 Mg Tabs (Montelukast sodium) .... Take 1 tablet by mouth once a day 5)  Megace Oral 40 Mg/ml Susp (Megestrol acetate) .... One teaspoon  every day 6)  Isometheptene-apap-dichloral 65-325-100 Mg Caps (Apap-isometheptene-dichloral) .... One every 3-4 hours as needed  Patient Instructions: 1)  Please schedule a follow-up appointment as needed. 2)  You have received a shot of Depo Medrol today to help with asthma & allergies. 3)  Restart your veramyst nasal spray daily. 4)  Use the Symbicort two times a day.  This inhaler is not for rescue. 5)  Use your Proventil as needed.  This inhaler IS for rescue. Prescriptions: PROVENTIL HFA 108 (90 BASE) MCG/ACT AERS (ALBUTEROL SULFATE) two puffs every 6-8 hours as needed  #1 x 2   Entered and Authorized by:   Esperanza Sheets PA   Signed by:   Esperanza Sheets PA on 03/01/2010   Method used:   Electronically to        [facility] [phone]* (retail)       [address]       [place], [place]  [address]       Ph: [phone] or [phone]       Fax: [phone]   RxID:   [id number]    Medication Administration  Injection # 1:    Medication: Depo- Medrol 80mg     Diagnosis: ASTHMA (ICD-493.90)    Route: IM    Site: RUOQ gluteus    Exp Date:  11/11    Lot #: [id number]    Mfr: Pharmacia    Patient tolerated injection without complications    Given by: Adella Hare LPN (March 01, 2010 10:09 AM)  Orders Added: 1)  Depo- Medrol 80mg  [J1040] 2)  Admin of Therapeutic Inj  intramuscular or subcutaneous [96372] 3)  Est. Patient Level IV [87564]

## 2011-01-03 NOTE — Assessment & Plan Note (Signed)
Summary: OV   Vital Signs:  Patient profile:   45 year old female Menstrual status:  regular Height:      65.5 inches Weight:      123 pounds BMI:     20.23 O2 Sat:      98 % Pulse rate:   100 / minute Pulse rhythm:   regular Resp:     16 per minute BP sitting:   110 / 80  (left arm) Cuff size:   regular  Vitals Entered By: Everitt Amber LPN (January 19, 2010 1:11 PM) CC: Follow up chronic problems Is Patient Diabetic? No Pain Assessment Patient in pain? no        Primary Care Provider:  Syliva Overman MD  CC:  Follow up chronic problems.  History of Present Illness: Pt started having a right sided throbbing headache with nausea, onnFerb 2, she left work ion the 7th with a headache and was also told her BP was high. Went tot the Ed on 02/08, she was dx with migraine which she states sghe last had 9 yrs ago.. She got 2 pills and 2 shots in the Ed with 90% relief, and has a script for midrin , she took 2 that night since then no rel;ief In the past 3 days she has developed increased sinus pressure with yellow nasal drainage  Current Medications (verified): 1)  Proventil Hfa 108 (90 Base) Mcg/act Aers (Albuterol Sulfate) .... Two Puffs Every 6-8 Hours As Needed 2)  Veramyst 27.5 Mcg/spray Susp (Fluticasone Furoate) .... Two Sprays Each Nostril Bid 3)  Symbicort 160-4.5 Mcg/act Aero (Budesonide-Formoterol Fumarate) .... 2 Puffs Twice Daily 4)  Singulair 10 Mg Tabs (Montelukast Sodium) .... Take 1 Tablet By Mouth Once A Day 5)  Megace Oral 40 Mg/ml Susp (Megestrol Acetate) .... One Teaspoon  Every Day 6)  Isometheptene-Apap-Dichloral 65-325-100 Mg Caps (Apap-Isometheptene-Dichloral) .... One Every 3-4 Hours As Needed  Allergies (verified): No Known Drug Allergies  Past History:  Past Medical History: Current Problems:  ASTHMA (ICD-493.90)  dx 2008 ALLERGIC RHINITIS, SEASONAL (ICD-477.0) Migraine headaches, initially at age 66, recurrence for the first time in 10 yrs  in 2011  Review of Systems      See HPI  Physical Exam  General:  Well-developed,well-nourished,in no acute distress; alert,appropriate and cooperative throughout examination HEENT: No facial asymmetry,  EOMI, frontal and maxillary sinus tenderness, TM's Clear, oropharynx  pink and moist.   Chest: Clear to auscultation bilaterally.  CVS: S1, S2, No murmurs, No S3.   Abd: Soft, Nontender.  MS: Adequate ROM spine, hips, shoulders and knees.  Ext: No edema.   CNS: CN 2-12 intact, power tone and sensation normal throughout.   Skin: Intact, no visible lesions or rashes.  Psych: Good eye contact, normal affect.  Memory intact, not anxious or depressed appearing.    Impression & Recommendations:  Problem # 1:  MIGRAINE HEADACHE (ICD-346.90) Assessment Comment Only  Her updated medication list for this problem includes:    Isometheptene-apap-dichloral 65-325-100 Mg Caps (Apap-isometheptene-dichloral) ..... One every 3-4 hours as needed  Problem # 2:  ACUTE SINUSITIS, UNSPECIFIED (ICD-461.9) Assessment: Comment Only  Her updated medication list for this problem includes:    Veramyst 27.5 Mcg/spray Susp (Fluticasone furoate) .Marland Kitchen..Marland Kitchen Two sprays each nostril bid    Zithromax Z-pak 250 Mg Tabs (Azithromycin) ..... Use as directed  Problem # 3:  HYPERLIPIDEMIA (ICD-272.4) Assessment: Comment Only    HDL:53 (03/23/2009), 59 (10/22/2007)  LDL:122 (03/23/2009), 116 (10/22/2007)  Chol:190 (03/23/2009), 185 (10/22/2007)  Trig:77 (03/23/2009), 51 (10/22/2007)dietary management only at this time  Complete Medication List: 1)  Proventil Hfa 108 (90 Base) Mcg/act Aers (Albuterol sulfate) .... Two puffs every 6-8 hours as needed 2)  Veramyst 27.5 Mcg/spray Susp (Fluticasone furoate) .... Two sprays each nostril bid 3)  Symbicort 160-4.5 Mcg/act Aero (Budesonide-formoterol fumarate) .... 2 puffs twice daily 4)  Singulair 10 Mg Tabs (Montelukast sodium) .... Take 1 tablet by mouth once a day 5)   Megace Oral 40 Mg/ml Susp (Megestrol acetate) .... One teaspoon  every day 6)  Isometheptene-apap-dichloral 65-325-100 Mg Caps (Apap-isometheptene-dichloral) .... One every 3-4 hours as needed 7)  Zithromax Z-pak 250 Mg Tabs (Azithromycin) .... Use as directed  Patient Instructions: 1)  F/U as before. 2)  You are being treated for infected sinuses 3)  Get plenty of rest, drink lots of clear liquids, and use Tylenol or Ibuprofen for fever and comfort. Call in 3 to 5 days if you're not better:sooner if you're feeling worse. Prescriptions: ZITHROMAX Z-PAK 250 MG TABS (AZITHROMYCIN) Use as directed  #6 x 0   Entered and Authorized by:   Syliva Overman MD   Signed by:   Syliva Overman MD on 01/19/2010   Method used:   Electronically to        [facility] [phone]* (retail)       [address]       [place], [place]  [address]       Ph: [phone] or [phone]       Fax: [phone]   RxID:   [id number]

## 2011-01-03 NOTE — Assessment & Plan Note (Signed)
Summary: OV   Vital Signs:  Patient profile:   45 year old female Menstrual status:  regular Height:      65.5 inches Weight:      115.75 pounds BMI:     19.04 O2 Sat:      98 % Pulse rate:   76 / minute Pulse rhythm:   regular Resp:     16 per minute BP sitting:   108 / 78  Vitals Entered By: Everitt Amber (December 20, 2009 10:35 AM) CC: Follow up chronic problems   Primary Care Provider:  Syliva Overman MD  CC:  Follow up chronic problems.  History of Present Illness: Reports  that she has been  doing well. Denies recent fever or chills. Denies sinus pressure, nasal congestion , ear pain or sore throat. Denies chest congestion, or cough productive of sputum. Denies chest pain, palpitations, PND, orthopnea or leg swelling. Denies abdominal pain, nausea, vomitting, diarrhea or constipation. Denies change in bowel movements or bloody stool. Denies dysuria , frequency, incontinence or hesitancy. Denies  joint pain, swelling, or reduced mobility. Denies headaches, vertigo, seizures. Denies depression, anxiety or insomnia. Denies  rash, lesions, or itch. She is concerned about her weight , and she wants to rake medication to assist with weight gain, she reports a good apetitie, ashe has benefited in the past from zyprexa, however with no psych symptoms, I am opting to prescribe megace for a short time.      Current Medications (verified): 1)  Proventil Hfa 108 (90 Base) Mcg/act Aers (Albuterol Sulfate) .... Two Puffs Every 6-8 Hours As Needed 2)  Veramyst 27.5 Mcg/spray Susp (Fluticasone Furoate) .... Two Sprays Each Nostril Bid 3)  Symbicort 160-4.5 Mcg/act Aero (Budesonide-Formoterol Fumarate) .... 2 Puffs Twice Daily 4)  Singulair 10 Mg Tabs (Montelukast Sodium) .... Take 1 Tablet By Mouth Once A Day  Allergies (verified): No Known Drug Allergies  Review of Systems      See HPI Eyes:  Denies blurring and discharge. Neuro:  Denies headaches, seizures, and sensation  of room spinning. Psych:  Denies anxiety and depression. Endo:  Denies cold intolerance, excessive hunger, excessive thirst, excessive urination, heat intolerance, polyuria, and weight change. Heme:  Denies abnormal bruising and bleeding. Allergy:  Complains of seasonal allergies; pt has year round allergies which she takes meds for regularly with slightly improved control.  Physical Exam  General:  Well-developed,well-nourished,in no acute distress; alert,appropriate and cooperative throughout examination HEENT: No facial asymmetry,  EOMI, No sinus tenderness, TM's Clear, oropharynx  pink and moist.   Chest: Clear to auscultation bilaterally.  CVS: S1, S2, No murmurs, No S3.   Abd: Soft, Nontender.  MS: Adequate ROM spine, hips, shoulders and knees.  Ext: No edema.   CNS: CN 2-12 intact, power tone and sensation normal throughout.   Skin: Intact, no visible lesions or rashes.  Psych: Good eye contact, normal affect.  Memory intact, not anxious or depressed appearing.    Impression & Recommendations:  Problem # 1:  WEIGHT LOSS (ICD-783.21) Assessment Comment Only bMI is 19.04. Pt to satrt megace low dose.  Problem # 2:  HYPERLIPIDEMIA (ICD-272.4) Assessment: Comment Only    HDL:53 (03/23/2009), 59 (10/22/2007)  LDL:122 (03/23/2009), 116 (10/22/2007)  Chol:190 (03/23/2009), 185 (10/22/2007)  Trig:77 (03/23/2009), 51 (10/22/2007)rept labs past due  Problem # 3:  ALLERGIC RHINITIS, SEASONAL (ICD-477.0) Assessment: Unchanged  Problem # 4:  ASTHMA (ICD-493.90) Assessment: Improved  Her updated medication list for this problem includes:    Proventil Hfa  108 (90 Base) Mcg/act Aers (Albuterol sulfate) .Marland Kitchen..Marland Kitchen Two puffs every 6-8 hours as needed    Symbicort 160-4.5 Mcg/act Aero (Budesonide-formoterol fumarate) .Marland Kitchen... 2 puffs twice daily    Singulair 10 Mg Tabs (Montelukast sodium) .Marland Kitchen... Take 1 tablet by mouth once a day  Problem # 5:  Preventive Health Care (ICD-V70.0) Assessment:  Comment Only mamo mordered a sis past due. Pt has upcomoing gynae appt  Problem # 6:  IMPAIRED FASTING GLUCOSE (ICD-790.21) Assessment: Comment Only pt needs HBA1C asap  Complete Medication List: 1)  Proventil Hfa 108 (90 Base) Mcg/act Aers (Albuterol sulfate) .... Two puffs every 6-8 hours as needed 2)  Veramyst 27.5 Mcg/spray Susp (Fluticasone furoate) .... Two sprays each nostril bid 3)  Symbicort 160-4.5 Mcg/act Aero (Budesonide-formoterol fumarate) .... 2 puffs twice daily 4)  Singulair 10 Mg Tabs (Montelukast sodium) .... Take 1 tablet by mouth once a day 5)  Megace Oral 40 Mg/ml Susp (Megestrol acetate) .... One teaspoon  every day  Patient Instructions: 1)  Please schedule a follow-up appointment in 4 months. 2)  new med as discussed for weight gain. 3)  pls continue chronic meds for allergies and asthma, I am glad you are doin well. 4)  pls resched your mamo. Prescriptions: MEGACE ORAL 40 MG/ML SUSP (MEGESTROL ACETATE) one teaspoon  every day  #150cc x 1   Entered and Authorized by:   Syliva Overman MD   Signed by:   Syliva Overman MD on 12/20/2009   Method used:   Electronically to        [facility] [phone]* (retail)       [address]       [place], [place]  [address]       Ph: [phone] or [phone]       Fax: [phone]   RxID:   [id number]   Appended Document: OV pls contact pt, advise she needs HBA1C asao0, since her fasting sugars are above nl, and she has been losing weight to ensure she is not diabewtic.  pLS order fasting blood glucose and HBA1C to be done at the lab asap, ask her to fast for 12 hrs, water is fine during that time,   Appended Document: OV test ordered, patient aware

## 2011-01-03 NOTE — Letter (Signed)
Summary: FMLA PAPERS  FMLA PAPERS   Imported By: Lind Guest 01/19/2010 14:27:21  _____________________________________________________________________  External Attachment:    Type:   Image     Comment:   External Document

## 2011-01-03 NOTE — Assessment & Plan Note (Signed)
Summary: office visit   Vital Signs:  Patient profile:   45 year old female Menstrual status:  regular Height:      65.5 inches Weight:      128 pounds BMI:     21.05 O2 Sat:      98 % Pulse rate:   75 / minute Pulse rhythm:   regular Resp:     16 per minute BP sitting:   122 / 86  (left arm) Cuff size:   regular  Vitals Entered By: Everitt Amber LPN (September 14, 2010 8:09 AM) CC: Follow up chronic problems, wants to discuss Zyprexa   Primary Care Provider:  Syliva Overman MD  CC:  Follow up chronic problems and wants to discuss Zyprexa.  History of Present Illness: Reports  that she has been doing fairly well. Tweo weeks ago she had sinus pressure, cough and yellow sputum which have since resolved She has stopped zyprexa since itmakes her sleepy, but is worried that she may lose weight. She has needed to use her proventil 3 times daily for the past 3 weeks Denies recent fever or chills. Denies sinus pressure, nasal congestion , ear pain or sore throat. Denies chest congestion, or cough productive of sputum. Denies chest pain, palpitations, PND, orthopnea or leg swelling. Denies abdominal pain, nausea, vomitting, diarrhea or constipation. Denies change in bowel movements or bloody stool. Denies dysuria , frequency, incontinence or hesitancy. Denies  joint pain, swelling, or reduced mobility. Denies headaches, vertigo, seizures. Denies depression, anxiety or insomnia. Denies  rash, lesions, or itch.     Current Medications (verified): 1)  Proventil Hfa 108 (90 Base) Mcg/act Aers (Albuterol Sulfate) .... Two Puffs Every 6-8 Hours As Needed 2)  Veramyst 27.5 Mcg/spray Susp (Fluticasone Furoate) .... Two Sprays Each Nostril Bid 3)  Symbicort 160-4.5 Mcg/act Aero (Budesonide-Formoterol Fumarate) .... 2 Puffs Twice Daily 4)  Singulair 10 Mg Tabs (Montelukast Sodium) .... Take 1 Tablet By Mouth Once A Day 5)  Zyprexa 2.5 Mg Tabs (Olanzapine) .... Take 1 Tab By Mouth At  Bedtime 6)  Ibuprofen 600 Mg Tabs (Ibuprofen) .... One Twicwe Daily As Needed For Severe Headache  Allergies (verified): No Known Drug Allergies  Review of Systems      See HPI General:  Complains of loss of appetite. Eyes:  Denies blurring and discharge. Endo:  Denies cold intolerance, excessive thirst, excessive urination, and heat intolerance. Heme:  Denies abnormal bruising and bleeding. Allergy:  Denies hives or rash and itching eyes.  Physical Exam  General:  Well-developed,well-nourished,in no acute distress; alert,appropriate and cooperative throughout examination HEENT: No facial asymmetry,  EOMI, No sinus tenderness, TM's Clear, oropharynx  pink and moist.   Chest: decreased air entry, few wheezes CVS: S1, S2, No murmurs, No S3.   Abd: Soft, Nontender.  MS: Adequate ROM spine, hips, shoulders and knees.  Ext: No edema.   CNS: CN 2-12 intact, power tone and sensation normal throughout.   Skin: Intact, no visible lesions or rashes.  Psych: Good eye contact, normal affect.  Memory intact, not anxious or depressed appearing.    Impression & Recommendations:  Problem # 1:  MIGRAINE HEADACHE (ICD-346.90) Assessment Unchanged  Her updated medication list for this problem includes:    Ibuprofen 600 Mg Tabs (Ibuprofen) ..... One twicwe daily as needed for severe headache, had recent headache flare with   Problem # 2:  WEIGHT LOSS (ICD-783.21) Assessment: Improved  Problem # 3:  ASTHMA (ICD-493.90) Assessment: Deteriorated  Her updated medication list  for this problem includes:    Proventil Hfa 108 (90 Base) Mcg/act Aers (Albuterol sulfate) .Marland Kitchen..Marland Kitchen Two puffs every 6-8 hours as needed    Symbicort 160-4.5 Mcg/act Aero (Budesonide-formoterol fumarate) .Marland Kitchen... 2 puffs twice daily    Singulair 10 Mg Tabs (Montelukast sodium) .Marland Kitchen... Take 1 tablet by mouth once a day    Prednisone (pak) 10 Mg Tabs (Prednisone) ..... Use as directed depomedrol admionistered for asthma  Complete  Medication List: 1)  Proventil Hfa 108 (90 Base) Mcg/act Aers (Albuterol sulfate) .... Two puffs every 6-8 hours as needed 2)  Veramyst 27.5 Mcg/spray Susp (Fluticasone furoate) .... Two sprays each nostril bid 3)  Symbicort 160-4.5 Mcg/act Aero (Budesonide-formoterol fumarate) .... 2 puffs twice daily 4)  Singulair 10 Mg Tabs (Montelukast sodium) .... Take 1 tablet by mouth once a day 5)  Ibuprofen 600 Mg Tabs (Ibuprofen) .... One twicwe daily as needed for severe headache 6)  Prednisone (pak) 10 Mg Tabs (Prednisone) .... Use as directed  Other Orders: T-Basic Metabolic Panel [phone] T-Lipid Profile [phone]) T-CBC No Diff [id number]) T-TSH [phone]) T-Vitamin D (25-Hydroxy) [phone]) Depo- Medrol 80mg  (J1040) Admin of Therapeutic Inj  intramuscular or subcutaneous (34742)  Patient Instructions: 1)  Follow up appointment in 5.63months 2)  you need to  use the veramyst twice daily as directed. 3)  stop zyprexa pls. 4)  Depomedrol injection today for asthma flare , to be followed by a dose pack of prednisone. 5)  BMP prior to visit, ICD-9: 6)  Lipid Panel prior to visit, ICD-9: 7)  TSH prior to visit, ICD-9:   fasting mid november 8)  CBC w/ Diff prior to visit, ICD-9: 9)  vit d 10)  It is important that you exercise regularly at least 30 minutes 5 times a week. If you develop chest pain, have severe difficulty breathing, or feel very tired , stop exercising immediately and seek medical attention. Prescriptions: PREDNISONE (PAK) 10 MG TABS (PREDNISONE) Use as directed  #21 x 0   Entered and Authorized by:   Syliva Overman MD   Signed by:   Syliva Overman MD on 09/14/2010   Method used:   Electronically to        [facility] [phone]* (retail)       [address]       [place], [place]  [address]       Ph: [phone] or [phone]       Fax: [phone]   RxID:   [id number]     Medication Administration  Injection # 1:     Medication: Depo- Medrol 80mg     Diagnosis: ACUTE BRONCHITIS (ICD-466.0)    Route: IM    Site: RUOQ gluteus    Exp Date: 05/2011    Lot #: [id number]    Mfr: Pharmacia    Comments: 80 mg given    Patient tolerated injection without complications    Given by: Mauricia Area, CMA  Orders Added: 1)  Est. Patient Level IV [31517] 2)  T-Basic Metabolic Panel [[id number]] 3)  T-Lipid Profile [[id number]] 4)  T-CBC No Diff [[id number]] 5)  T-TSH [[id number]] 6)  T-Vitamin D (25-Hydroxy) [26948-54627] 7)  Depo- Medrol 80mg  [J1040] 8)  Admin of Therapeutic Inj  intramuscular or subcutaneous [96372]  Appended Document: office visit injection given for dx: asthma not bronchitis

## 2011-01-05 NOTE — Assessment & Plan Note (Signed)
Summary: ABD PAIN/DIARRHEA AFTER EVERY MEAL/LAW   Visit Type:  Follow-up Visit Primary Care Provider:  Lodema Hong  Chief Complaint:  F/U abd pain/diarrhea.  History of Present Illness: 45 y/o black female w/ post-prandial diarrhea within 1-2 hrs after eating.  BM up to tid.  Denies mucus or bleeding.  Denies melena.  Denies fever.  Hx chills.  s/p cholecystectomy 10/2009.  c/o RUQ discomfort intermittant.  Denies nausea or vomiting.  Wt up 6# in past 2 years.  Has tried metamucil but caused more diarrhea.    Current Medications (verified): 1)  Proventil Hfa 108 (90 Base) Mcg/act Aers (Albuterol Sulfate) .... Two Puffs Every 6-8 Hours As Needed 2)  Veramyst 27.5 Mcg/spray Susp (Fluticasone Furoate) .... Two Sprays Each Nostril Bid 3)  Symbicort 160-4.5 Mcg/act Aero (Budesonide-Formoterol Fumarate) .... 2 Puffs Twice Daily 4)  Singulair 10 Mg Tabs (Montelukast Sodium) .... Take 1 Tablet By Mouth Once A Day 5)  Ibuprofen 600 Mg Tabs (Ibuprofen) .... One Twicwe Daily As Needed For Severe Headache 6)  Levsin/sl 0.125 Mg Subl (Hyoscyamine Sulfate) .Marland Kitchen.. 1 By Mouth Ac/hs (Up To Qid) For Diarrhea/abd Pain 7)  Align  Caps (Probiotic Product) .Marland Kitchen.. 1 By Mouth Daily  Allergies (verified): No Known Drug Allergies  Past History:  Past Medical History: Current Problems:  ASTHMA (ICD-493.90)  dx 2008 ALLERGIC RHINITIS, SEASONAL (ICD-477.0) Migraine headaches, initially at age 83, recurrence for the first time in 10 yrs in 2011 IBS Colonoscopy w/ simple adenoma 2008 treatment for h pylori gastritis on EGD 2009  Family History: No known family history of colorectal carcinoma, IBD, liver or chronic GI problems. Mother  hTN  Father  alcoholic Sisters x 2, HTN x 2   and DM x1  Brother x2   hTN x 1 and DMx1  Social History: employed, [employer] Married Never Smoked Alcohol use-no Drug use-no  Review of Systems General:  Denies fever, chills, sweats, anorexia, fatigue, weakness, malaise, weight  loss, and sleep disorder; concerned w/ wt loss, but scales here & at PCP, do not document this. CV:  Denies chest pains, angina, palpitations, syncope, dyspnea on exertion, orthopnea, PND, peripheral edema, and claudication. Resp:  Denies dyspnea at rest, dyspnea with exercise, cough, sputum, wheezing, coughing up blood, and pleurisy. GI:  See HPI; Denies vomiting, vomiting blood, jaundice, and fecal incontinence. GU:  Denies urinary burning, blood in urine, nocturnal urination, urinary frequency, and urinary incontinence. MS:  Denies joint pain / LOM, joint swelling, joint stiffness, joint deformity, low back pain, muscle weakness, muscle cramps, muscle atrophy, leg pain at night, leg pain with exertion, and shoulder pain / LOM hand / wrist pain (CTS). Derm:  Denies rash, itching, dry skin, hives, moles, warts, and unhealing ulcers. Psych:  Denies depression, anxiety, memory loss, suicidal ideation, hallucinations, paranoia, phobia, and confusion. Heme:  Denies bruising, bleeding, and enlarged lymph nodes.  Vital Signs:  Patient profile:   45 year old female Menstrual status:  regular Height:      65.5 inches Weight:      125 pounds BMI:     20.56 Temp:     98.3 degrees F Pulse rate:   60 / minute BP sitting:   130 / 78  (left arm) Cuff size:   regular  Vitals Entered By: Cloria Spring LPN (December 19, 2010 9:37 AM)  Physical Exam  General:  Well developed, well nourished, no acute distress. Head:  Normocephalic and atraumatic. Eyes:  Sclera clear, no icterus. Ears:  Normal auditory acuity. Nose:  No deformity, discharge,  or lesions. Mouth:  No deformity or lesions, dentition normal. Neck:  Supple; no masses or thyromegaly. Lungs:  Clear throughout to auscultation. Heart:  Regular rate and rhythm; no murmurs, rubs,  or bruits. Abdomen:  Soft, nontender and nondistended. No masses, hepatosplenomegaly or hernias noted. Normal bowel sounds.without guarding and without rebound.     Msk:  Symmetrical with no gross deformities. Normal posture. Pulses:  Normal pulses noted. Extremities:  No clubbing, cyanosis, edema or deformities noted. Neurologic:  Alert and  oriented x4;  grossly normal neurologically. Skin:  Intact without significant lesions or rashes. Cervical Nodes:  No significant cervical adenopathy. Psych:  Alert and cooperative. Normal mood and affect.  Impression & Recommendations:  Problem # 1:  IBS (ICD-564.1) 45 y/o black female w/ hx IBS.  Previous work-up including EGD/colonoscopy & celiac panel negative.  Hx h pylori gastritis.  I suspect diarrhea secondary to IBS +/- post-cholecystectomy diarrhea.  Trial ALIGN and anti-spasmotic since fiber causes more diarrhea for her.  She seems very concerned regarding percieved weight loss, however this has not been documented.  Will continue to monitor.  Orders: Est. Patient Level IV (14782)  Patient Instructions: 1)  IBS literature given & explained 2)  Trial levsin,  if no relief will add Questran  3)  Office visit in 6-8 wks Prescriptions: ALIGN  CAPS (PROBIOTIC PRODUCT) 1 by mouth daily  #31 x 11   Entered and Authorized by:   Joselyn Arrow FNP-BC   Signed by:   Joselyn Arrow FNP-BC on 12/19/2010   Method used:   Electronically to        [facility] [phone]* (retail)       [address]       [place], [place]  [address]       Ph: [phone] or [phone]       Fax: [phone]   RxID:   [id number] LEVSIN/SL 0.125 MG SUBL (HYOSCYAMINE SULFATE) 1 by mouth ac/hs (up to qid) for diarrhea/abd pain  #90 x 1   Entered and Authorized by:   Joselyn Arrow FNP-BC   Signed by:   Joselyn Arrow FNP-BC on 12/19/2010   Method used:   Electronically to        [facility] [phone]* (retail)       [address]       [place], [place]  [address]       Ph: [phone] or [phone]       Fax: [phone]   RxID:   [id number]   Appended Document: ABD  PAIN/DIARRHEA AFTER EVERY MEAL/LAW 6-8 WK F/U OPV IS IN THE COMPUTER

## 2011-01-27 ENCOUNTER — Encounter: Payer: Self-pay | Admitting: Family Medicine

## 2011-01-30 ENCOUNTER — Ambulatory Visit (INDEPENDENT_AMBULATORY_CARE_PROVIDER_SITE_OTHER): Payer: PRIVATE HEALTH INSURANCE | Admitting: Urgent Care

## 2011-01-30 ENCOUNTER — Encounter: Payer: Self-pay | Admitting: Urgent Care

## 2011-01-30 DIAGNOSIS — R197 Diarrhea, unspecified: Secondary | ICD-10-CM | POA: Insufficient documentation

## 2011-01-30 DIAGNOSIS — R1011 Right upper quadrant pain: Secondary | ICD-10-CM | POA: Insufficient documentation

## 2011-02-06 ENCOUNTER — Encounter: Payer: Self-pay | Admitting: Family Medicine

## 2011-02-08 ENCOUNTER — Ambulatory Visit (INDEPENDENT_AMBULATORY_CARE_PROVIDER_SITE_OTHER): Payer: PRIVATE HEALTH INSURANCE | Admitting: Family Medicine

## 2011-02-08 ENCOUNTER — Encounter: Payer: Self-pay | Admitting: Family Medicine

## 2011-02-08 DIAGNOSIS — J0110 Acute frontal sinusitis, unspecified: Secondary | ICD-10-CM | POA: Insufficient documentation

## 2011-02-08 DIAGNOSIS — N951 Menopausal and female climacteric states: Secondary | ICD-10-CM

## 2011-02-08 DIAGNOSIS — J45909 Unspecified asthma, uncomplicated: Secondary | ICD-10-CM

## 2011-02-14 NOTE — Letter (Signed)
Summary: neb machine  neb machine   Imported By: Lind Guest 02/08/2011 15:52:03  _____________________________________________________________________  External Attachment:    Type:   Image     Comment:   External Document

## 2011-02-14 NOTE — Assessment & Plan Note (Signed)
Summary: FU OV 6-8 WEEKS/IBS/SS   Vital Signs:  Patient profile:   45 year old female Menstrual status:  regular Height:      65 inches Weight:      123 pounds BMI:     20.54 Temp:     98 degrees F oral Pulse rate:   68 / minute BP sitting:   140 / 82  (left arm)  Vitals Entered By: Carolan Clines LPN (January 30, 2011 10:52 AM)  Visit Type:  Follow-up Visit Primary Care Provider:  Lodema Hong   History of Present Illness: Here for FU diarrhea/abd pain.  ?IBS vs. post-cholecystectomy diarrhea,  She continues to c/o RUQ pain & diarrhea after eating.  "Every time I eat"  No diarrhea over weekend, one BM per day.  Denies rectal bleeding or melena.  Align too expensive.  Tried levsin two times a day almost every day.  Helps some.  Worse w/ pasta.  Denies abx, foreign travel, new pets, or new meds.  c/o itchy rash all over body x 1 month, but can't remember if this started before or after Levsin.  Trying to get appt w/ Dr Lodema Hong VW:UJWJ.  Denies any anxiety, stress or depression.  hx h pylori gastritis s/p prevpac.  Current Medications (verified): 1)  Proventil Hfa 108 (90 Base) Mcg/act Aers (Albuterol Sulfate) .... Two Puffs Every 6-8 Hours As Needed 2)  Veramyst 27.5 Mcg/spray Susp (Fluticasone Furoate) .... Two Sprays Each Nostril Bid 3)  Symbicort 160-4.5 Mcg/act Aero (Budesonide-Formoterol Fumarate) .... 2 Puffs Twice Daily 4)  Singulair 10 Mg Tabs (Montelukast Sodium) .... Take 1 Tablet By Mouth Once A Day 5)  Ibuprofen 600 Mg Tabs (Ibuprofen) .... One Twicwe Daily As Needed For Severe Headache 6)  Levsin/sl 0.125 Mg Subl (Hyoscyamine Sulfate) .Marland Kitchen.. 1 By Mouth Ac/hs (Up To Qid) For Diarrhea/abd Pain 7)  Align  Caps (Probiotic Product) .Marland Kitchen.. 1 By Mouth Daily 8)  Questran 4 Gm/dose Powd (Cholestyramine) .... 4 Grams By Mouth Daily As Directed Not Within 2 Hrs of Other Meds As Needed For Diarrhea  Allergies (verified): No Known Drug Allergies  Past History:  Past Medical History: Last  updated: 12/19/2010 Current Problems:  ASTHMA (ICD-493.90)  dx 2008 ALLERGIC RHINITIS, SEASONAL (ICD-477.0) Migraine headaches, initially at age 64, recurrence for the first time in 10 yrs in 2011 IBS Colonoscopy w/ simple adenoma 2008 treatment for h pylori gastritis on EGD 2009  Past Surgical History: Last updated: 11/15/2007 Partial Hysterectomy (2002) Tubal ligation (1995)  Review of Systems      See HPI General:  Complains of fatigue and weight loss; denies fever, chills, sweats, anorexia, weakness, malaise, and sleep disorder. CV:  Denies chest pains, angina, palpitations, syncope, dyspnea on exertion, orthopnea, PND, peripheral edema, and claudication. Resp:  Denies dyspnea at rest, dyspnea with exercise, cough, sputum, wheezing, coughing up blood, and pleurisy. GI:  See HPI; Denies difficulty swallowing and pain on swallowing. GU:  Denies urinary burning, blood in urine, nocturnal urination, urinary frequency, urinary incontinence, and abnormal vaginal bleeding. MS:  Denies joint pain / LOM, joint swelling, joint stiffness, joint deformity, low back pain, muscle weakness, muscle cramps, muscle atrophy, leg pain at night, leg pain with exertion, and shoulder pain / LOM hand / wrist pain (CTS). Derm:  See HPI; Complains of rash. Psych:  Denies depression, anxiety, memory loss, suicidal ideation, hallucinations, paranoia, phobia, and confusion. Heme:  Denies bruising, bleeding, and enlarged lymph nodes.  Physical Exam  General:  Well developed, well nourished, no  acute distress. Head:  Normocephalic and atraumatic. Eyes:  Sclera clear, no icterus. Mouth:  No deformity or lesions, dentition normal. Neck:  Supple; no masses or thyromegaly. Lungs:  Clear throughout to auscultation. Heart:  Regular rate and rhythm; no murmurs, rubs,  or bruits. Abdomen:  Soft, nontender and nondistended. No masses, hepatosplenomegaly or hernias noted. Normal bowel sounds.without guarding and  without rebound.   Msk:  Symmetrical with no gross deformities. Normal posture. Pulses:  Normal pulses noted. Extremities:  No clubbing, cyanosis, edema or deformities noted. Neurologic:  Alert and  oriented x4;  grossly normal neurologically. Skin:  erthematous rash upper trunk, shoulder &arms Cervical Nodes:  No significant cervical adenopathy. Psych:  Alert and cooperative. Normal mood and affect.   Impression & Recommendations:  Problem # 1:  DIARRHEA (ICD-35.91) 45 y/o black female w/ chronic diarrhea, minimal response to anti-spamotics, probiotics & fiber.  Differentials include IBS, post-cholecystectomy diarrhea, lactose intolerance, less likely recent infection.  hx adenomatous colon polyp. If no better, pending stools & trial of questran,  may need colonoscopy.  Orders: Est. Patient Level III (04540) T-Stool Culture (98119) T-Fecal WBC (14782-95621) T-Stool for O&P (30865-78469) T-C diff by PCR (62952) T-Helicobactor Pylori Antigen Stool (84132)  Problem # 2:  RUQ PAIN (ICD-789.01) Chronic pain & anorexia w/ weight loss similar to when she had h pylori.  May be due to IBS, but should r/o resistant h pylori.  Patient Instructions: 1)  STOP LEVSIN 2)  See Dr Lodema Hong regarding your rash 3)  Turn in stool studies as soon as possible Prescriptions: QUESTRAN 4 GM/DOSE POWD (CHOLESTYRAMINE) 4 grams by mouth daily as directed not within 2 hrs of other meds as needed for diarrhea  #120 grams x 0   Entered and Authorized by:   Joselyn Arrow FNP-BC   Signed by:   Joselyn Arrow FNP-BC on 01/30/2011   Method used:   Electronically to        [facility] [phone]* (retail)       [address]       [place], [place]  [address]       Ph: [phone] or [phone]       Fax: [phone]   RxID:   [id number]    Orders Added: 1)  Est. Patient Level III [55732] 2)  T-Stool Culture [87045] 3)  T-Fecal WBC [20254-27062] 4)  T-Stool for O&P  [87177-70555] 5)  T-C diff by PCR [81755] 6)  T-Helicobactor Pylori Antigen Stool [37628]

## 2011-02-21 NOTE — Assessment & Plan Note (Signed)
Summary: office visit   Vital Signs:  Patient profile:   45 year old female Menstrual status:  regular Height:      65 inches Weight:      121 pounds BMI:     20.21 O2 Sat:      96 % Pulse rate:   89 / minute Pulse rhythm:   regular Resp:     16 per minute BP sitting:   140 / 90  (left arm) Cuff size:   regular  Vitals Entered By: Everitt Amber LPN (February 07, 1609 9:16 AM) CC: c/o bad headache that lasted 12 days at end of Feb. Started at the center of forehead and worked its way to the sides of her head and down her neck . Ibuprofen did not help. Eyes were swollen during that period also. For 2 months she has been having night sweats   Primary Care Provider:  Lodema Hong  CC:  c/o bad headache that lasted 12 days at end of Feb. Started at the center of forehead and worked its way to the sides of her head and down her neck . Ibuprofen did not help. Eyes were swollen during that period also. For 2 months she has been having night sweats.  History of Present Illness: pt reportsshe has not been well in the past 2 weeks, she has had acute sinus infection with headacjhe and wheezing. She is also experiencing peimenopausal symptoms with night sweats and mood swings.  Current Medications (verified): 1)  Proventil Hfa 108 (90 Base) Mcg/act Aers (Albuterol Sulfate) .... Two Puffs Every 6-8 Hours As Needed 2)  Veramyst 27.5 Mcg/spray Susp (Fluticasone Furoate) .... Two Sprays Each Nostril Bid 3)  Symbicort 160-4.5 Mcg/act Aero (Budesonide-Formoterol Fumarate) .... 2 Puffs Twice Daily 4)  Singulair 10 Mg Tabs (Montelukast Sodium) .... Take 1 Tablet By Mouth Once A Day 5)  Questran 4 Gm/dose Powd (Cholestyramine) .... 4 Grams By Mouth Daily As Directed Not Within 2 Hrs of Other Meds As Needed For Diarrhea  Allergies (verified): 1)  ! * Hyoscamine Sulfate 2)  ! * Align  Review of Systems      See HPI General:  Complains of fatigue, sleep disorder, and sweats; denies chills and fever; night  sweats for sveevral weeks, getting . Eyes:  Denies discharge and red eye. ENT:  Complains of nasal congestion, postnasal drainage, and sinus pressure; 11 day history frontal pressure extending to back of head, yellow drainage. CV:  Denies chest pain or discomfort, palpitations, and swelling of hands. Resp:  Complains of cough, shortness of breath, and wheezing; denies sputum productive; 2 week history. GI:  Complains of abdominal pain; abdominal pain when she eats, has iBS. GU:  Denies dysuria and urinary frequency. Neuro:  Complains of headaches; denies poor balance, seizures, sensation of room spinning, tingling, tremors, and visual disturbances; 1 week hiswtory of headache. Psych:  Complains of easily angered; denies easily tearful, suicidal thoughts/plans, thoughts of violence, and unusual visions or sounds; irritability and mood instability along with night sweats. Endo:  Complains of heat intolerance; denies cold intolerance, excessive hunger, and excessive thirst. Heme:  Denies abnormal bruising and bleeding. Allergy:  Complains of seasonal allergies.  Physical Exam  General:  Well-developed,well-nourished,in no acute distress; alert,appropriate and cooperative throughout examinationill appearing HEENT: No facial asymmetry,  EOMI,frontal  sinus tenderness, TM's Clear, oropharynx  pink and moist.   Chest: decreased air entry with wheezing CVS: S1, S2, No murmurs, No S3.   Abd: Soft, Nontender.  MS:  Adequate ROM spine, hips, shoulders and knees.  Ext: No edema.   CNS: CN 2-12 intact, power tone and sensation normal throughout.   Skin: Intact, no visible lesions or rashes.  Psych: Good eye contact, normal affect.  Memory intact, not anxious or depressed appearing.    Impression & Recommendations:  Problem # 1:  ACUTE FRONTAL SINUSITIS (ICD-461.1) Assessment Comment Only  Her updated medication list for this problem includes:    Veramyst 27.5 Mcg/spray Susp (Fluticasone furoate)  .Marland Kitchen..Marland Kitchen Two sprays each nostril bid    Septra Ds 800-160 Mg Tabs (Sulfamethoxazole-trimethoprim) .Marland Kitchen... Take 1 tablet by mouth two times a day  Orders: Rocephin  250mg  (E4540) Admin of Therapeutic Inj  intramuscular or subcutaneous (98119)  Problem # 2:  SYMPTOMATIC MENOPAUSAL/FEMALE CLIMACTERIC STATES (ICD-627.2) Assessment: Deteriorated  Problem # 3:  ASTHMA (ICD-493.90) Assessment: Deteriorated  Her updated medication list for this problem includes:    Proventil Hfa 108 (90 Base) Mcg/act Aers (Albuterol sulfate) .Marland Kitchen..Marland Kitchen Two puffs every 6-8 hours as needed    Symbicort 160-4.5 Mcg/act Aero (Budesonide-formoterol fumarate) .Marland Kitchen... 2 puffs twice daily    Singulair 10 Mg Tabs (Montelukast sodium) .Marland Kitchen... Take 1 tablet by mouth once a day    Prednisone (pak) 5 Mg Tabs (Prednisone) ..... Use as directed    Duoneb 0.5-2.5 (3) Mg/103ml Soln (Ipratropium-albuterol) ..... Use one vial every 6 to 8 hours for uncontrolled wheezing in nebulising machine  Orders: Depo- Medrol 80mg  (J1040)  Problem # 4:  WEIGHT LOSS (ICD-783.21)  Complete Medication List: 1)  Proventil Hfa 108 (90 Base) Mcg/act Aers (Albuterol sulfate) .... Two puffs every 6-8 hours as needed 2)  Veramyst 27.5 Mcg/spray Susp (Fluticasone furoate) .... Two sprays each nostril bid 3)  Symbicort 160-4.5 Mcg/act Aero (Budesonide-formoterol fumarate) .... 2 puffs twice daily 4)  Singulair 10 Mg Tabs (Montelukast sodium) .... Take 1 tablet by mouth once a day 5)  Questran 4 Gm/dose Powd (Cholestyramine) .... 4 grams by mouth daily as directed not within 2 hrs of other meds as needed for diarrhea 6)  Septra Ds 800-160 Mg Tabs (Sulfamethoxazole-trimethoprim) .... Take 1 tablet by mouth two times a day 7)  Prednisone (pak) 5 Mg Tabs (Prednisone) .... Use as directed 8)  Paroxetine Hcl 10 Mg Tabs (Paroxetine hcl) .... Take 1 tablet by mouth once a day 9)  Duoneb 0.5-2.5 (3) Mg/74ml Soln (Ipratropium-albuterol) .... Use one vial every 6 to 8 hours  for uncontrolled wheezing in nebulising machine  Patient Instructions: 1)  Please schedule a follow-up appointment in 2 months. 2)  You are being treated for sinusitis, asthma , and perimenopausal symptoms. 3)  you will get rocephin 500mg  im in the office, depomedrol 80mg  im today, and a breathing treatmernt. 4)  you will get info on perimenopause, and new meds are sent in. you need a neb machine at home  also Prescriptions: DUONEB 0.5-2.5 (3) MG/3ML SOLN (IPRATROPIUM-ALBUTEROL) use one vial every 6 to 8 hours for uncontrolled wheezing in nebulising machine  #90 x 1   Entered and Authorized by:   Syliva Overman MD   Signed by:   Syliva Overman MD on 02/08/2011   Method used:   Electronically to        [facility] [phone]* (retail)       [address]       [place], [place]  [address]       Ph: [phone] or [phone]  Fax: [phone]   RxID:   [id number] PAROXETINE HCL 10 MG TABS (PAROXETINE HCL) Take 1 tablet by mouth once a day  #30 x 2   Entered and Authorized by:   Syliva Overman MD   Signed by:   Syliva Overman MD on 02/08/2011   Method used:   Electronically to        [facility] [phone]* (retail)       [address]       [place], [place]  [address]       Ph: [phone] or [phone]       Fax: [phone]   RxID:   [id number] PREDNISONE (PAK) 5 MG TABS (PREDNISONE) Use as directed  #21 x 0   Entered and Authorized by:   Syliva Overman MD   Signed by:   Syliva Overman MD on 02/08/2011   Method used:   Electronically to        [facility] [phone]* (retail)       [address]       [place], [place]  [address]       Ph: [phone] or [phone]       Fax: [phone]   RxID:   [id number] SEPTRA DS 800-160 MG TABS (SULFAMETHOXAZOLE-TRIMETHOPRIM) Take 1 tablet by mouth two times a day  #20 x 0   Entered and Authorized by:   Syliva Overman MD   Signed by:   Syliva Overman  MD on 02/08/2011   Method used:   Electronically to        [facility] [phone]* (retail)       [address]       [place], [place]  [address]       Ph: [phone] or [phone]       Fax: [phone]   RxID:   [id number]    Medication Administration  Injection # 1:    Medication: Depo- Medrol 80mg     Diagnosis: ASTHMA (ICD-493.90)    Route: IM    Site: LUOQ gluteus    Exp Date: 11/12    Lot #: [id number]    Mfr: [email or web address]    Patient tolerated injection without complications    Given by: Adella Hare LPN (February 08, 1539 10:12 AM)  Injection # 2:    Medication: Rocephin  250mg     Diagnosis: ACUTE FRONTAL SINUSITIS (ICD-461.1)    Route: IM    Site: RUOQ gluteus    Exp Date: 08/14    Lot #: [id number]    Mfr: [email or web address]    Comments: rocephin 500mg  given    Patient tolerated injection without complications    Given by: Adella Hare LPN (February 08, 9508 10:13 AM)  Orders Added: 1)  Est. Patient Level IV [32671] 2)  Depo- Medrol 80mg  [J1040] 3)  Rocephin  250mg  [J0696] 4)  Admin of Therapeutic Inj  intramuscular or subcutaneous [96372]     Medication Administration  Injection # 1:    Medication: Depo- Medrol 80mg     Diagnosis: ASTHMA (ICD-493.90)    Route: IM    Site: LUOQ gluteus    Exp Date: 11/12    Lot #: [id number]    Mfr: [email or web address]    Patient tolerated injection without complications    Given by: Adella Hare LPN (February 07, 2457 10:12 AM)  Injection # 2:  Medication: Rocephin  250mg     Diagnosis: ACUTE FRONTAL SINUSITIS (ICD-461.1)    Route: IM    Site: RUOQ gluteus    Exp Date: 08/14    Lot #: [id number]    Mfr: [email or web address]    Comments: rocephin 500mg  given    Patient tolerated injection without complications    Given by: Adella Hare LPN (February 07, 8118 10:13 AM)  Orders Added: 1)  Est. Patient Level IV [14782] 2)  Depo- Medrol 80mg  [J1040] 3)  Rocephin  250mg  [J0696] 4)  Admin of Therapeutic Inj  intramuscular or subcutaneous [95621]

## 2011-03-01 ENCOUNTER — Ambulatory Visit: Payer: Self-pay | Admitting: Family Medicine

## 2011-03-08 LAB — CBC
HCT: 36.4 % (ref 36.0–46.0)
MCV: 93.3 fL (ref 78.0–100.0)
Platelets: 131 10*3/uL — ABNORMAL LOW (ref 150–400)
RDW: 13.1 % (ref 11.5–15.5)
WBC: 5.1 10*3/uL (ref 4.0–10.5)

## 2011-03-08 LAB — BASIC METABOLIC PANEL
BUN: 9 mg/dL (ref 6–23)
Chloride: 105 mEq/L (ref 96–112)
GFR calc non Af Amer: >60 mL/min (ref >60)
Glucose, Bld: 84 mg/dL (ref 70–99)
Potassium: 3.6 mEq/L (ref 3.5–5.1)

## 2011-03-08 LAB — HEPATIC FUNCTION PANEL
Alkaline Phosphatase: 44 U/L (ref 39–117)
Bilirubin, Direct: 0.1 mg/dL (ref 0.0–0.3)
Total Bilirubin: 0.5 mg/dL (ref 0.3–1.2)

## 2011-04-07 ENCOUNTER — Encounter: Payer: Self-pay | Admitting: Family Medicine

## 2011-04-11 ENCOUNTER — Encounter: Payer: Self-pay | Admitting: Family Medicine

## 2011-04-11 ENCOUNTER — Ambulatory Visit (INDEPENDENT_AMBULATORY_CARE_PROVIDER_SITE_OTHER): Payer: PRIVATE HEALTH INSURANCE | Admitting: Family Medicine

## 2011-04-11 VITALS — BP 150/90 | HR 61 | Resp 16 | Ht 66.5 in | Wt 122.1 lb

## 2011-04-11 DIAGNOSIS — J301 Allergic rhinitis due to pollen: Secondary | ICD-10-CM

## 2011-04-11 DIAGNOSIS — K219 Gastro-esophageal reflux disease without esophagitis: Secondary | ICD-10-CM | POA: Insufficient documentation

## 2011-04-11 DIAGNOSIS — I10 Essential (primary) hypertension: Secondary | ICD-10-CM

## 2011-04-11 DIAGNOSIS — Z23 Encounter for immunization: Secondary | ICD-10-CM

## 2011-04-11 DIAGNOSIS — E785 Hyperlipidemia, unspecified: Secondary | ICD-10-CM

## 2011-04-11 DIAGNOSIS — J45909 Unspecified asthma, uncomplicated: Secondary | ICD-10-CM

## 2011-04-11 MED ORDER — AMLODIPINE BESYLATE 2.5 MG PO TABS: 2.5000 mg | ORAL_TABLET | Freq: Every day | ORAL | Status: DC

## 2011-04-11 MED ORDER — DEXLANSOPRAZOLE 30 MG PO CPDR: 30.0000 mg | DELAYED_RELEASE_CAPSULE | Freq: Every day | ORAL | Status: AC

## 2011-04-11 NOTE — Assessment & Plan Note (Signed)
Controlled, no change in medication  

## 2011-04-11 NOTE — Assessment & Plan Note (Signed)
New diagnosis, fasting labs in 2 months

## 2011-04-11 NOTE — Progress Notes (Signed)
  Subjective:    Patient ID: Hannah Hays, female    DOB: 1966/06/26, 45 y.o.   MRN: [id number]  HPI Solid dysphagia intermittent x 2 yrs, does have reflux symptoms and drinks caffeine beverages. She reports her asthma is doing well. She has chronic nasal congestion, at one time surgery was recommended however with little assurance that it would work, so she halted on this Exercise on avg 3 days per week.   Review of Systems See HPI Denies recent fever or chills. Denies sinus pressure, ear pain or sore throat. Denies chest congestion, productive cough or wheezing. Denies chest pains, palpitations, paroxysmal nocturnal dyspnea, orthopnea and leg swelling Denies abdominal pain,vomiting, or constipation.  Denies rectal bleeding or change in bowel movement. Denies dysuria, frequency, hesitancy or incontinence. Denies joint pain, swelling and limitation in mobility. Denies headaches, seizure, numbness, or tingling. Denies depression, anxiety or insomnia. Denies skin break down or rash.        Objective:   Physical Exam Patient alert and oriented and in no Cardiopulmonary distress.  HEENT: No facial asymmetry, EOMI, no sinus tenderness, TM's clear, Oropharynx pink and moist.  Neck supple no adenopathy.  Chest: Clear to auscultation bilaterally.  CVS: S1, S2 no murmurs, no S3.  ABD: Soft mild epigastric tenderness. Bowel sounds normal.  Ext: No edema  MS: Adequate ROM spine, shoulders, hips and knees.  Skin: Intact, no ulcerations or rash noted.  Psych: Good eye contact, normal affect. Memory intact not anxious or depressed appearing.  CNS: CN 2-12 intact, power, tone and sensation normal throughout.        Assessment & Plan:

## 2011-04-11 NOTE — Patient Instructions (Signed)
F/u in 2 months.  You have a new diagnosis of hypertension  Med is also started for reflux, pls stop drinking/eating caffeine products  It is important that you exercise regularly at least 30 minutes 5 times a week. If you develop chest pain, have severe difficulty breathing, or feel very tired, stop exercising immediately and seek medical attention    We will provide the DASH diet for hypertension . A healthy diet is rich in fruit, vegetables and whole grains. Poultry fish, nuts and beans are a healthy choice for protein rather then red meat. A low sodium diet and drinking 64 ounces of water daily is generally recommended. Oils and sweet should be limited. Carbohydrates especially for those who are diabetic or overweight, should be limited to 34-45 gram per meal. It is important to eat on a regular schedule, at least 3 times daily. Snacks should be primarily fruits, vegetables or nuts. TDAP and pneumovac  Today(congrats)  Fasting labs in 2 months lipid , chem 7, and h pylori

## 2011-04-11 NOTE — Assessment & Plan Note (Signed)
Deteriorated with symptoms suggestive of stricture

## 2011-04-11 NOTE — Assessment & Plan Note (Signed)
Unchanged, continue daily med

## 2011-04-11 NOTE — Assessment & Plan Note (Signed)
dietarty management only at this time, rept lab in 2 month

## 2011-04-18 NOTE — Consult Note (Signed)
Hannah Hays, Hannah Hays                 ACCOUNT NO.:  [id number]   MEDICAL RECORD NO.:  [id number]          PATIENT TYPE:  AMB   LOCATION:  DAY                           FACILITY:  [facility]   PHYSICIAN:  Kassie Mends, M.D.      DATE OF BIRTH:  16-Dec-1965   DATE OF CONSULTATION:  03/17/2008  DATE OF DISCHARGE:                                 CONSULTATION   REFERRING Hannah Hays:  Franchot Heidelberg, MD   PROBLEMS:  1. Anorexia, diarrhea, and weight loss.  2. Sinus problems.  3. Asthma.  4. Hysterectomy secondary to fibroids   SUBJECTIVE:  Hannah Hays is a 45 year old female who presents to return  patient visit.  She states that she has been small all of her life.  She stated 4 to 5 years ago, she was placed on an antidepressant, but  does not remember the name and gained up to 142 pounds.  Her diarrhea  comes and goes.  She has had one episode since her colonoscopy in  December 2008.  She reports that she had an ultrasound, which showed  some nonspecific liver lesions and had an MRI.  The MRI report is not  available to me today.  She is currently having 1 to 2 formed bowel  movements.  She used to stay hungry, but now she has to force herself to  eat.  If she does not eat, she gets a headache.  Her appetite has been  poor.  She has no problem swallowing, heartburn, indigestion, nausea,  vomiting, black stool, abdominal pain, rash, or sores in her mouth.  She  has never been treated for or evaluated for an eating disorder.  She  denies feeling depressed or anxious.  She stays cold, but works in the  cold 10 to 12 hours.   SOCIAL HISTORY:  She works second shift.  She denies any alcohol or  tobacco use.  She is single and has 2 children and 1 grandchild.   FAMILY HISTORY:  She has no family history of diarrheal illnesses.   OBJECTIVE:  VITALS SIGNS:  Weight 112 pounds, height 5 feet 5 inches,  BMI 18.6 (underweight), (maximum lifetime BMI 23.6), temperature 97.8,  blood pressure 102/78, and  pulse 64.  GENERAL:  She is in no apparent distress, alert, and oriented x4.HEENT:  Atraumatic and normocephalic.  Pupils equal and reactive to light.  Mouth,no oral lesions.  Dentition normal.  Posterior pharynx without  erythema or exudate.  NECK:  Full range of motion and no lymphadenopathy.LUNGS:  Clear to  auscultation bilaterally.CHEST:  No axillary  lymphadenopathy.CARDIOVASCULAR:  Regular rhythm and no murmur.ABDOMEN:  Bowel sounds are present.  Soft, nontender, and  nondistended.EXTREMITIES:  No cyanosis or edema.NEUROLOGICAL:  She has  no focal neurologic deficits.   ASSESSMENT:  Hannah Hays is a 45 year old female who presents with  anorexia, weight loss, and intermittent diarrhea.  She had negative  serologies for celiac sprue.  A differential diagnosis may include an  upper gastrointestinal source, such as hypertrophic gastritis, and a low  likelihood of lymphoma, eosinophilic gastritis, or duodenitis.  She  has  a low likelihood of having celiac sprue, but it is not 0%.   Thank you for allowing me to see Hannah Hays in consultation. My  recommendations are as follows.   RECOMMENDATIONS:  1. Will check a TSH and hepatic function panel today.  2. She is asked to add Remeron 1 orally nightly for 7 days and then 2      orally nightly for 7 days, #60, refill x5.  3. She will have an EGD next Thursday or Friday.  She has a follow up      appointment to see me within the next 1 to 2 months.      Kassie Mends, M.D.  Electronically Signed     SM/MEDQ  D:  03/17/2008  T:  03/18/2008  Job:  [id number]   cc:   Franchot Heidelberg, M.D.

## 2011-04-18 NOTE — Assessment & Plan Note (Signed)
NAME:  Hannah Hays, Hannah Hays                  CHART#:  [id number]   DATE:                                   DOB:  May 31, 1966   PROBLEM LIST:  1. Helicobacter pylori gastritis treated with Prevpac in April 2009      and most likely etiology for her anorexia and weight loss.  2. Chronic sinus infection.  3. History of 4-mm simple adenoma.  4. Colonoscopy in 2018.   SUBJECTIVE:  The patient is a 45 year old female who initially presented  to clinic in December 2008 with chronic diarrhea.  She had a colonoscopy  which had negative stool studies, and a colonoscopy was performed with  random biopsies that showed no evidence of microscopic colitis.  She had  a simple tubular adenoma.  She was seen in followup in February 2009 and  was still down 10 pounds from her original weight presentation in  December 2009.  She was scheduled for an upper endoscopy, and biopsies  were positive for H. pylori gastritis.  She took the Prevpac without any  complications and presents as a return patient visit on today.  She has  had no heartburn, indigestion, nausea, vomiting, abdominal pain, or  diarrhea.  Her weight is improved.  She has no family history of colon  cancer or colon polyps.  She continues to work at [employer].   MEDICATIONS:  1. Symbicort.  2. Proventil as needed.  3. Veramyst.  4. Tylenol as needed.   OBJECTIVE:  VITAL SIGNS:  Weight is 124 pounds, height 5 feet 5 inches,  BMI 20.6 (healthy), temperature is 98, blood pressure 126/82, and pulse  72.  GENERAL:  She is in no apparent distress.  Alert and orient x4. LUNGS:  Clear to auscultation bilaterally.CARDIOVASCULAR:  Regular rhythm.  ABDOMEN:  Bowel sounds are present.  Soft, nontender, and nondistended.   ASSESSMENT:  The patient is a 45 year old female who had anorexia, and  weight loss secondary to Helicobacter pylori gastritis.  Her symptoms  are now resolved, and she is gaining weight, and she is now normal  weight by her body mass  index.  She also had a simple tubular adenoma  and no family history of colon cancer.  Thank you for allowing me to see  the patient in consultation.  My recommendations follow.   RECOMMENDATIONS:  1. Screening colonoscopy in 2018.  2. She may follow up with me as needed.       Kassie Mends, M.D.  Electronically Signed     SM/MEDQ  D:  05/13/2008  T:  05/14/2008  Job:  [id number]   cc:   Milus Mallick. Lodema Hong, M.D.

## 2011-04-18 NOTE — Op Note (Signed)
[name], Hays                 ACCOUNT NO.:  [id number]   MEDICAL RECORD NO.:  [id number]          PATIENT TYPE:  AMB   LOCATION:  DAY                           FACILITY:  [facility]   PHYSICIAN:  Kassie Mends, M.D.      DATE OF BIRTH:  03-23-1966   DATE OF PROCEDURE:  03/20/2008  DATE OF DISCHARGE:                               OPERATIVE REPORT   REFERRING PHYSICIAN:  Franchot Heidelberg, M.D.   PROCEDURE:  Esophagogastroduodenoscopy with cold forceps biopsy of the  gastric and duodenal mucosa.   INDICATIONS FOR EXAMINATION:  Hannah Hays is a 45 year old female who  presents with anorexia and weight loss.   FINDINGS:  1. Normal esophagus without evidence of Barrett's,  mass, erosion,      ulceration, or stricture.  2. A 1- to 2-cm hiatal hernia, mild erythema in the antrum. Otherwise      normal stomach.  Biopsy obtained via cold forceps to evaluate for      eosinophilic gastritis or H. pylori gastritis.  3. Normal duodenal bulb in the second portion of the duodenum.  Biopsy      is obtained via cold forceps to evaluate for celiac sprue.   DIAGNOSIS:  No obvious reason for anorexia or weight loss identified.   RECOMMENDATIONS:  1. She may resume her previous diet.  2. No aspirin, NSAIDs, or anticoagulation for 3 days.  3. We will call her with the results of her biopsy.  She already has a      follow up appointment to see me in 1-2 months.   MEDICATIONS:  1. Demerol 50 mg IV.  2. Versed 4 mg IV.   PROCEDURE:  Physical examination was performed and informed consent was  obtained from the patient after explaining the benefits, risks, and  alternatives to the procedure.  The patient was connected to monitor and  placed in the left lateral position.  Continuous oxygen was provided by  nasal cannula.  IV medicines administered through an indwelling cannula.  After administration of sedation, the patient's esophagus was then  intubated, and scope was advanced under direct  visualization to the  second portion of duodenum.  Scope was removed slowly by carefully  examining the color, texture, anatomy and integrity of the mucosa on the  way out.  The patient was recovered in endoscopy and discharged home in  satisfactory condition.   PATH:  No celiac sprue; H. pylori gastritis could explain her anorexia.  Will treat and have her follow up.      Kassie Mends, M.D.  Electronically Signed     SM/MEDQ  D:  03/20/2008  T:  03/20/2008  Job:  [id number]   cc:   Franchot Heidelberg, M.D.

## 2011-04-18 NOTE — Procedures (Signed)
NAMEGEAN, LAURSEN                 ACCOUNT NO.:  [id number]   MEDICAL RECORD NO.:  [id number]          PATIENT TYPE:  OUT   LOCATION:  RESP                          FACILITY:  [facility]   PHYSICIAN:  Edward L. Juanetta Gosling, M.D.DATE OF BIRTH:  11-Aug-1966   DATE OF PROCEDURE:  06/27/2007  DATE OF DISCHARGE:  06/27/2007                            PULMONARY FUNCTION TEST   PROCEDURE:  Pulmonary function tests.   1. Spirometry shows no definite ventilatory defect but does show some      evidence of airflow obstruction.  2. 0Lung volumes are normal.  3. Diffusion capacity carbon monoxide.   This is consistent with a clinical diagnosis of asthma.      Edward L. Juanetta Gosling, M.D.  Electronically Signed     ELH/MEDQ  D:  07/05/2007  T:  07/05/2007  Job:  [id number]   cc:   Ladona Horns. Mariel Sleet, MD  Fax: [phone]

## 2011-04-18 NOTE — Assessment & Plan Note (Signed)
NAME:  Hannah, Hays                  CHART#:  [id number]   DATE:  01/16/2008                       DOB:  1966-08-15   CHIEF COMPLAINT:  Follow up colonoscopy for a history of chronic  diarrhea.   SUBJECTIVE:  The patient is a 45 year old female who underwent a  colonoscopy by Dr. Cira Servant on 11/13/2007.  She had both a brother and  sister who passed away last year.  She was under a significant amount of  stress.  She was having chronic non-bloody diarrhea.  She underwent  colonoscopy.  She was found to have a 4-mm sessile adenomatous polyp  removed from the transverse colon.  Random biopsies were also obtained  from the transverse colon which were benign.  She tells me she has had  no further diarrhea.  She has used some fiber supplements which seem to  have worked well.  She is having 3-4 bowel movements a week.  She denies  any rectal bleeding or melena.  She denies any abdominal pain, nausea or  vomiting.  She denies any anorexia.  Her weight is down another 10  pounds since we saw her 2 months ago.  She is under a significant amount  of stress.  Her mother passed away a week and a half ago due to cardiac  arrest, so she has now lost a brother 7 months ago, and a sister 4  months ago, and her mother a week and a half ago.  She is only eating  about 2 meals per day per her recall.  She also had a negative celiac  antibody panel.  She denies any fever or chills.   CURRENT MEDICATIONS:  See the list from January 16, 2008.   ALLERGIES:  No known drug allergies.   PHYSICAL EXAMINATION:  VITAL SIGNS:  Weight 119 pounds, height 65.5  inches, temp 98.2, blood pressure 104/78, pulse 60.  GENERAL:  Hannah Hays is a thin African American female who is alert,  oriented, pleasant, cooperative in no acute distress.  HEENT:  Sclerae clear, nonicteric.  Conjunctivae pink.  Oropharynx pink  and moist without any lesions.  CHEST:  Heart regular rate and rhythm  with a normal S1 S2.  ABDOMEN:   Positive bowel sounds x4.  No bruits  auscultated.  Soft, nontender, nondistended without palpable masses or  hepatosplenomegaly.  No rebound tenderness or guarding.  EXTREMITIES:  Without clubbing or edema bilaterally.   ASSESSMENT:  Diarrhea, resolved.  Hannah Hays has been under significant  amount of stress.  It is felt that her symptoms could be related to  irritable bowel syndrome, however, her significant weight loss is  concerning.  If she continues to loose weight would suggest further  evaluation with esophagogastroduodenoscopy.  She has a history of an  adenomatous polyp as well on recent colonoscopy.   PLAN:  1. Increase calories in her diet.  She is given literature on this.      She is to make an attempt to eat 3 meals and snacks throughout the      day.  I would like for her to come back for a weight check in 1      month.  2. She can use fiber supplement of choice daily.  3. Her siblings and her children should have colonoscopy at  age 44 and      every 5 years.  4. She will have a colonoscopy in December 2013, or sooner if she has      any problems.  5. If she continues to lose weight, would proceed with EGD  6. OPV in one month with [facility].       Lorenza Burton, N.P.  Electronically Signed     Kassie Mends, M.D.  Electronically Signed    KJ/MEDQ  D:  01/17/2008  T:  01/19/2008  Job:  [id number]   cc:   Franchot Heidelberg, M.D.

## 2011-04-18 NOTE — Consult Note (Signed)
Hannah Hays, Hannah Hays                 ACCOUNT NO.:  [id number]   MEDICAL RECORD NO.:  [id number]          PATIENT TYPE:  AMB   LOCATION:  DAY                           FACILITY:  [facility]   PHYSICIAN:  Kassie Mends, M.D.      DATE OF BIRTH:  1966-03-28   DATE OF CONSULTATION:  11/08/2007  DATE OF DISCHARGE:                                 CONSULTATION   REQUESTING PHYSICIAN:  Milus Mallick. Lodema Hong, M.D.   CHIEF COMPLAINT:  Chronic diarrhea.   HISTORY OF PRESENT ILLNESS:  Hannah Hays is a 45 year old female who  developed significant diarrhea about 5-6 weeks ago.  She notes that she  has loose stools every time she eats four or more times a day.  She has  a longstanding history of inability to tolerate lactose-containing  products and dairy for 2-3 years so she has been avoiding these foods.  More recently she has had problems with tomato-based pasta as well.  She  notes every time she eats just about anything, she is having to run to  the bathroom with diarrhea.  Her weight has remained stable.  She denies  any fever or chills.  She does have some abdominal cramping, which is  relieved post defecation.  She denies any history of constipation.  Denies any heartburn or indigestion.  Denies any NSAID use.  Prior to 6  weeks ago she had normal daily bowel movements.  She denies any recent  travel, antibiotics, new pets or new medications.  She did have full set  of stool studies through Dr. Anthony Sar office.  Copies were illegible  and I did not receive a final stool culture report, but her C. difficile  was negative as was her lactoferrin, and we will request the remaining  results.  She was treated with Cipro 500 mg b.i.d. for 5 days and has  completed.  This this did not make a difference in her symptoms.   PAST MEDICAL AND SURGICAL HISTORY:  Chronic sinus infections.  She has  been seen by Dr. Annalee Genta.  She has had a hysterectomy.   CURRENT MEDICATIONS:  1. Symbicort 160/4.5 mcg two puffs  daily.  2. Proventil p.r.n.  3. Veramyst two puffs in each nostril b.i.d.  4. Tylenol p.r.n.   ALLERGIES:  No known drug allergies.   FAMILY HISTORY:  There is no known family history of colorectal  carcinoma, liver or chronic GI problems.  There is significant history  of hypertension and diabetes mellitus and CHF.   SOCIAL HISTORY:  Hannah Hays is married.  She has one healthy son and  daughter.  She is employed with [employer] full-time.  She denies any  tobacco, alcohol or drug use.   REVIEW OF SYSTEMS:  See HPI, otherwise negative.   PHYSICAL EXAM:  VITAL SIGNS:  Weight 129.5 pounds, height 66 inches,  temperature 97.8.  blood pressure 130/90, pulse 64.  GENERAL:  Hannah Hays is a well-developed, well-nourished African American  female in no acute distress.  HEENT:.  Sclerae are clear, nonicteric.  Conjunctivae are pink.  Pharynx  pink and  moist without any lesions.  NECK:  Supple without mass or thyromegaly.  CHEST:  Heart regular rate and rhythm, normal S1, S2, without murmurs,  clicks, rubs or gallops.  Lungs clear to auscultation bilaterally.  ABDOMEN:  Positive bowel sounds x4.  No bruits auscultated.  Soft,  nontender, nondistended, without palpable mass or hepatosplenomegaly.  No rebound tenderness or guarding.  EXTREMITIES:  Without clubbing or edema bilaterally.   IMPRESSION:  Hannah Hays is a 45 year old female with the abrupt onset of  change in bowel habits.  She previously had a bowel movement once daily.  Now she is noting diarrhea every time she eats anything.  She has a  longstanding history of self-reported lactose intolerance; which she has  avoided for many years.  However, now her symptoms are more consistent  on a daily basis with any type of food.  She has been treated with Cipro  with little relief, making a bacterial etiology less likely.  Stool  studies have been benign.  Given her abrupt change in stools,  differential would include postinfectious irritable  bowel syndrome,  microscopic colitis, or colorectal carcinoma, and I feel she needs  further evaluation.  She may have an element of lactose intolerance;  however, she has been on a lactose-free diet for quite some time now.   PLAN:  1. Bentyl 10 mg a.c. and h.s. p.r.n. diarrhea, #60 with one refill.  2. Will request stool culture and C. difficile results from Dr.      Anthony Sar office as well as recent laboratory studies.  If these do      not include a CBC, sed rate and TSH, we will need to obtain these.  3. Colonoscopy with Dr. Cira Servant in the near future.  I have discussed      this procedure including the risks and benefits, which include but      are not limited to bleeding, infection, perforation, drug reaction.      She agrees and a signed consent will be obtained.   We would like to thank Dr. Lodema Hong for allowing [place] to participate in the  care of Hannah Hays.   ADDENDUM:  Differential diagnosis includes celiac sprue. Will obtain  random colon biopsies. Needs quantitive Immunoglobulins and TTG I gA  prior to TCS.      Lorenza Burton, N.P.      Kassie Mends, M.D.  Electronically Signed    KJ/MEDQ  D:  11/08/2007  T:  11/08/2007  Job:  [id number]   cc:   Milus Mallick. Lodema Hong, M.D.  Fax: [phone]

## 2011-04-18 NOTE — Op Note (Signed)
Hannah Hays, Hannah Hays                 ACCOUNT NO.:  [id number]   MEDICAL RECORD NO.:  [id number]          PATIENT TYPE:  AMB   LOCATION:  DAY                           FACILITY:  [facility]   PHYSICIAN:  Kassie Mends, M.D.      DATE OF BIRTH:  02/01/1966   DATE OF PROCEDURE:  11/13/2007  DATE OF DISCHARGE:                               OPERATIVE REPORT   PROCEDURE:  Colonoscopy with cold forceps biopsy and cold forceps  polypectomy.   INDICATION FOR EXAM:  Hannah Hays is a 45 year old female, who had regular  bowel movements until approximately one month ago.  Her change in bowel  habits was preceded by the death of her brother in 08/06/23 and the death  of her sister in 06-Oct-2023.  She has a history of lactose-intolerance and  has tried avoiding lactose products, but it did not change her bowel  movements.  She presents for evaluation for chronic diarrhea.  She has  had routine stool cultures and C. diff, which were negative.   FINDINGS:  1. A 4 mm sessile transverse colon polyp, removed via cold forceps.  2. Random biopsies obtained throughout the colon to evaluate for      microscopic colitis.  3. Otherwise no masses, inflammatory changes or diverticular AVMs      seen.  4. Normal retroflexed view of the rectum.   DIAGNOSES:  No immediate etiology for Hannah Hays's diarrhea identified.  She does have abdominal cramping, followed by bowel movement, with  relief of her pain, which would be consistent with irritable bowel  syndrome.   RECOMMENDATIONS:  1. We will draw quantitative immunoglobulins and tissue      transglutaminase today to complete her evaluation.  2. She should continue Bentyl.  3. She should also be on a high-fiber diet.  She was given a hand-out      on high-fiber diet and polyps.  4. She should add Benefiber or Fiber-Sure daily.  5. No aspirin, NSAIDs or anticoagulation for seven days.  6. We will call Hannah Hays with the results of her biopsies.  7. Followup appointment  with Lorenza Burton in eight weeks.   MEDICATIONS:  1. Demerol 50 mg IV.  2. Versed 5 mg IV.   PROCEDURE TECHNIQUE:  Physical exam was performed and informed consent  was obtained from the patient, after explaining the benefits, risks and  alternatives to the procedure.  The patient was connected to the monitor  and placed in left lateral position.  Continuous oxygen was provided by  nasal cannula and IV medicine administered through an indwelling  cannula.  After administration of sedation and rectal exam, the  patient's rectum was intubated and the scope was advanced under direct  visualization to the cecum.  Scope was removed slowly by carefully  examining the color, texture, anatomy and integrity of the mucosa on the  way out.  Patient was recovered in endoscopy and discharged home in  satisfactory condition.   ADDENDUM:  TTG IgA negative with nl IgA levels. Tubular Adenoma. TCS in  5 years. High fiber diet. First  degree relative need TCS at age 62 then  q5 years.      Kassie Mends, M.D.  Electronically Signed     SM/MEDQ  D:  11/13/2007  T:  11/13/2007  Job:  [id number]   cc:   Milus Mallick. Lodema Hong, M.D.  Fax: [phone]

## 2011-04-21 NOTE — Op Note (Signed)
[facility]  Patient:    Hannah Hays, Hannah Hays                        MRN: [id number] Proc. Date: 05/15/00 Adm. Date:  [id number] Disc. Date: [id number] Attending:  Malon Kindle                           Operative Report  PREOPERATIVE DIAGNOSIS:  Fibroids.  Persistent abnormal uterine bleeding. Postcoital bleeding unresponsive to medical therapy, and left vulvar cyst.  POSTOPERATIVE DIAGNOSIS:  Fibroids.  Persistent abnormal uterine bleeding. Postcoital bleeding unresponsive to medical therapy, and left vulvar cyst.  PROCEDURE:  Vaginal hysterectomy, removal of vulvar cyst.  SURGEON:  Malachi Pro. Ambrose Mantle, M.D.  ASSISTANT:  Alvino Chapel, M.D.  ANESTHESIA:  General anesthesia.  DESCRIPTION OF PROCEDURE:  The patient was brought to the operating room and placed under satisfactory general anesthesia and placed in the lithotomy position. Examination revealed the uterus to be retroverted, upper limits of normal size.  The adnexa were free of masses.  The vulva, vagina, and perineum were prepped with Betadine solution.  A Foley catheter was inserted to straight drain and the area was draped as a sterile field.  The cervix was drawn into the operative field and the cervicovaginal junction was injected with a dilute solution of Neosynephrine. A circumferential incision was made around the cervix at the cervicovaginal junction. The anterior vaginal mucosa was pushed ahead.  The posterior cul-de-sac was identified, entered by sharp dissection.  Both uterosacral ligaments were clamped, cut, suture ligated, and held.  Cardinal ligaments were clamped, cut, and suture ligated.  The parametrial tissues were clamped, cut, and suture ligated.  The anterior peritoneum was identified and entered.  The bladder was retracted away. Additional bites were taken above the uterine vessels bilaterally, clamped, cut, and suture ligated.  The uterus was then  inverted through the incision in the cul-de-sac.  Both upper pedicles were clamped across.  The uterus was removed. A double ligature was placed across each upper pedicle.  There was some bleeding. I could never identify a heavy source of bleeding, but several smaller bleeding sites were identified and suture ligated.  After hemostasis was adequate, I placed a pursestring suture through the pelvic peritoneum beginning anteriorly incorporating the upper and lower pedicles and cul-de-sac in the suture.  This was then tied down.  The uterosacral ligaments were sutured together extraperitoneally.  All sutures were cut.  The vaginal mucosa was closed with interrupted figure-of-eight sutures of 0 Vicryl.  It should be noted that both ovaries appeared normal and ven though she had had some endometriosis medial to the left uterosacral ligament at the time of her tubal ligation, I did not see any endometriosis.  A vaginal pack was placed at the end of the procedure.  The left vulvar cyst at about 4 or 5 oclock was identified.  I grasped it with an Allis clamp trying to get below the base.  I then incised the tissue so that I was going to remove it in toto, but  did enter the cyst and it spilled brownish material.  I then tried to identify he exact extent of the cyst and it actually was quite a bit more than met the eye nd extended into the paravaginal tissues laterally.  I removed the entire cystic structure, obtained hemostasis, and closed with skin with interrupted 3-0 Vicryl suture.  At this  point, hemostasis appeared completely adequate.  There was no bleeding and there was no sign of a hematoma formation.  The procedure was terminated.  Estimated blood loss was thought to be about 200 cc.  Sponge, needle, and instrument counts were correct.  The patient was returned to the recovery room in satisfactory condition. DD:  05/15/00 TD:  05/18/00 Job: [id number]

## 2011-04-21 NOTE — H&P (Signed)
[facility]  Patient:    Hannah Hays, Hannah Hays                        MRN: [id number] Adm. Date:  [id number] Disc. Date: [id number] Attending:  Malon Kindle                         History and Physical  HISTORY OF PRESENT ILLNESS:  This is a 45 year old black female, para 2-0-0-2, who was admitted to the hospital for vaginal hysterectomy, possible abdominal hysterectomy because of severe dysmenorrhea, abnormal uterine bleeding, and postcoital bleeding, unresponsive to hormonal therapy.  Last menstrual period was Apr 27, 2000 for nine days, became light, became heavy, and ended lightly. The patients menstrual periods have been irregular and have lasted 6-13 days. They are heavy and accompanied by severe dysmenorrhea.  The patient has been treated with Provera and continues to have abnormal bleeding and postcoital bleeding.  She underwent an endometrial biopsy that showed secretory endometrium and benign endometrial polyp without malignancy.  An ultrasound on Apr 09, 2000 showed evidence of fibroid with possibly one representing a submucous of fibroid; however, that was uncertain.  The patient was informed that it is possible that a D&C hysterostomy could be beneficial; however, hysterectomy would cure the bleeding and pain.  PAST MEDICAL HISTORY:  ALLERGIES:  Reveals no known allergies.  OPERATIONS:  Tubal ligation.  ILLNESSES:  No significant adult illnesses.  No heart ailments.  HABITS:  No alcohol or tobacco.  FAMILY HISTORY:  Mother 42 and father 18, both with high blood pressure and diabetes, and the mother has heart disease.  Two sisters, both with diabetes and high blood pressure and two brothers, one of whom has diabetes and both have high blood pressure.  PHYSICAL EXAMINATION:  GENERAL:  Well-developed, slender black female, weighing 104-1/4 pounds.  VITAL SIGNS:  Blood pressure 120/80 with pulse of 70.  HEENT:  No cranial  abnormalities.  Extraocular movements intact.  Nose and pharynx clear.  NECK:  Supple without thyromegaly.  BREASTS:  Small without masses.  HEART:  Normal size.  There is a 2/6 systolic ejection murmur at the base and the apex.  LUNGS:  Clear.  ABDOMEN:  Soft, nontender.  No masses are palpable.  PELVIC:  Pap smear on March 30, 2000 was benign, reactive changes.  The vulva is clean at the 5 oclock position with a small 2 cm introital cyst.  The cervix is clean.  The uterus is posterior, upper limit of normal size.  The cul-de-sacs feel free.  The adnexae are clear.  There are small fibroids present.  ADMITTING IMPRESSION: 1. Severe dysmenorrhea. 2. Persistent abnormal uterine bleeding in spite of hormone therapy. 3. Postcoital bleeding. 4. Leiomyomata uteri. 5. Small introital cyst.  PLAN:  The patient is admitted for vaginal hysterectomy, possible abdominal hysterectomy, removal of introital cyst.  It should be noted that at the time of her laparoscopy she was noted to have a small amount of cul-de-sac endometriosis, but I do not feel adherence of the rectum to the posterior lower uterine segment.  She also has an anemia with normal indices and a white count of only 2800 with 50% neutrophils.  The patient has been informed of the risks of surgery, including, but not limited to, pulmonary embolus, thrombophlebitis, hemorrhage with need for reoperation and/or transfusion, fistula formation, nerve injury, intestinal obstruction, and evisceration. She understands and agrees to proceed. DD:  05/14/00 TD:  05/19/00 Job: [id number]

## 2011-04-21 NOTE — Discharge Summary (Signed)
[facility]  Patient:    Hannah Hays, Hannah Hays                        MRN: [id number] Adm. Date:  [id number] Disc. Date: [id number] Attending:  Malon Kindle                           Discharge Summary  HISTORY OF PRESENT ILLNESS:  This is a 45 year old black female with fibroids, persistent abnormal bleeding, postcoital bleeding unresponsive to medical therapy, and a left vulvar cyst admitted for a vaginal hysterectomy and removal of the vulvar cyst.  She underwent the procedure by Dr. Ambrose Mantle with Dr. Senaida Ores assisting under general anesthesia on May 15, 2000.  Blood loss was about 200 cc.  Wound pack was left in the vagina.  The pack was removed with some staining on the first postop day.  The patient did well.  She had good bowel and urinary function and was tolerating a regular diet and was ready for discharge on the second postop day.  LABORATORY DATA:  Pathology report showed her uterus and cervix with squamous metaplasia.  No dysplasia.  Secretory endometrium.  No evidence of hyperplasia or malignancy.  Leiomyoma submucosal intramural and subserosal with no malignancy.  Focal serosal endometriosis.  No evidence of malignancy.  A cyst from the vaginal introitus.  Benign Bartholin gland cyst.  No evidence of malignancy.  Hemoglobin on admission was 10.9, hematocrit 33.8, white count 2800, platelet count 183,000.  Followup hematocrits were 31.3 and 29.2.  There were 50 segs, 37 lymphs, 7 monos, 2 eosinophils, 1 basophil.  Comprehensive metabolic profile was normal except for alkaline phosphatase which was low at 35 and BUN was low 5.  Serum pregnancy test was negative.  Urinalysis was negative.  FINAL DIAGNOSES:  Persistent abnormal uterine bleeding, postcoital bleeding, leiomyoma uteri, subserosal, submucosal and intramural.  Left vulvar cyst.  OPERATION:  Vaginal hysterectomy.  Removal of left vulvar cyst.  FINAL CONDITION:   Improved.  INSTRUCTIONS:  Regular diet.  No vaginal entrance.  No heavy lifting or strenuous activity.  Report any temperature elevation greater than 100.4 degrees.  Report any unusual problems.  Return to the office in 10-14 days for followup examination. DD:  05/17/00 TD:  05/22/00 Job: [id number]

## 2011-05-15 ENCOUNTER — Other Ambulatory Visit: Payer: Self-pay

## 2011-05-15 MED ORDER — BUDESONIDE-FORMOTEROL FUMARATE 160-4.5 MCG/ACT IN AERO: 2.0000 | INHALATION_SPRAY | Freq: Two times a day (BID) | RESPIRATORY_TRACT | Status: DC

## 2011-05-23 ENCOUNTER — Telehealth: Payer: Self-pay | Admitting: Family Medicine

## 2011-05-23 NOTE — Telephone Encounter (Signed)
Have form, will complete

## 2011-06-26 ENCOUNTER — Telehealth: Payer: Self-pay | Admitting: Family Medicine

## 2011-06-26 NOTE — Telephone Encounter (Signed)
Called patient, left message.

## 2011-06-27 NOTE — Telephone Encounter (Signed)
Called patient, left message.

## 2011-06-28 ENCOUNTER — Encounter: Payer: Self-pay | Admitting: Family Medicine

## 2011-06-28 ENCOUNTER — Ambulatory Visit (INDEPENDENT_AMBULATORY_CARE_PROVIDER_SITE_OTHER): Payer: PRIVATE HEALTH INSURANCE | Admitting: Family Medicine

## 2011-06-28 VITALS — BP 130/80 | HR 58 | Resp 16 | Ht 65.75 in | Wt 117.4 lb

## 2011-06-28 DIAGNOSIS — J45909 Unspecified asthma, uncomplicated: Secondary | ICD-10-CM

## 2011-06-28 DIAGNOSIS — R059 Cough, unspecified: Secondary | ICD-10-CM

## 2011-06-28 DIAGNOSIS — J301 Allergic rhinitis due to pollen: Secondary | ICD-10-CM

## 2011-06-28 DIAGNOSIS — R05 Cough: Secondary | ICD-10-CM

## 2011-06-28 MED ORDER — METHYLPREDNISOLONE ACETATE 80 MG/ML IJ SUSP
80.0000 mg | Freq: Once | INTRAMUSCULAR | Status: AC
Start: 2011-06-28 — End: 2011-06-28
  Administered 2011-06-28: 80 mg via INTRAMUSCULAR

## 2011-06-28 MED ORDER — PREDNISONE (PAK) 5 MG PO TABS: 5.0000 mg | ORAL_TABLET | ORAL | Status: DC

## 2011-06-28 MED ORDER — ALBUTEROL SULFATE HFA 108 (90 BASE) MCG/ACT IN AERS: 2.0000 | INHALATION_SPRAY | Freq: Four times a day (QID) | RESPIRATORY_TRACT | Status: DC | PRN

## 2011-06-28 MED ORDER — IPRATROPIUM BROMIDE 0.02 % IN SOLN
0.5000 mg | Freq: Once | RESPIRATORY_TRACT | Status: AC
  Administered 2011-06-28: 0.5 mg via RESPIRATORY_TRACT

## 2011-06-28 MED ORDER — ALBUTEROL SULFATE (2.5 MG/3ML) 0.083% IN NEBU
2.5000 mg | INHALATION_SOLUTION | Freq: Once | RESPIRATORY_TRACT | Status: AC
  Administered 2011-06-28: 2.5 mg via RESPIRATORY_TRACT

## 2011-06-28 NOTE — Patient Instructions (Addendum)
F/U as 4 months  You are having an asthma flare.   You will get depomedrol 80mg  IM in the office and a dose pack of prednisone is also being sent in

## 2011-06-28 NOTE — Progress Notes (Signed)
  Subjective:    Patient ID: Hannah Hays, female    DOB: 23-Oct-1966, 45 y.o.   MRN: [id number]  HPI  1 week h/o cough and shortness of breath. No fever or chills or sputum production. Has no proventil, has upcoming trip and is anxious to have this adresed before travel. l  Review of Systems Denies recent fever or chills. Denies sinus pressure,reports increased  nasal congestion,denies  ear pain or sore throat. Denies chest congestion or  productive cough  Denies chest pains, palpitations and leg swelling Denies abdominal pain, nausea, vomiting,diarrhea or constipation.   Denies dysuria, frequency, hesitancy or incontinence. Denies joint pain, swelling and limitation in mobility. Denies headaches, seizures, numbness, or tingling. Denies depression, anxiety or insomnia. Denies skin break down or rash.        Objective:   Physical Exam Patient alert and oriented and in no cardiopulmonary distress.  HEENT: No facial asymmetry, EOMI, no sinus tenderness,  oropharynx pink and moist.  Neck supple no adenopathy. Erythema and edema of nasal mucosa  Chest:decreased air entry with bilateral wheezing  CVS: S1, S2 no murmurs, no S3.  ABD: Soft non tender. Bowel sounds normal.  Ext: No edema  MS: Adequate ROM spine, shoulders, hips and knees.  Skin: Intact, no ulcerations or rash noted.  Psych: Good eye contact, normal affect. Memory intact not anxious or depressed appearing.  CNS: CN 2-12 intact, power, tone and sensation normal throughout.        Assessment & Plan:

## 2011-07-04 ENCOUNTER — Ambulatory Visit: Payer: PRIVATE HEALTH INSURANCE | Admitting: Family Medicine

## 2011-07-09 NOTE — Assessment & Plan Note (Signed)
Currently uncontrolled with asthma flare

## 2011-07-09 NOTE — Assessment & Plan Note (Signed)
Acute flare, will treat aggresively

## 2011-08-07 ENCOUNTER — Emergency Department (HOSPITAL_COMMUNITY): Payer: PRIVATE HEALTH INSURANCE

## 2011-08-07 ENCOUNTER — Emergency Department (HOSPITAL_COMMUNITY)
Admission: EM | Admit: 2011-08-07 | Discharge: 2011-08-07 | Disposition: A | Discharge status: Home / Self Care | Payer: PRIVATE HEALTH INSURANCE | Attending: Emergency Medicine | Admitting: Emergency Medicine

## 2011-08-07 ENCOUNTER — Encounter (HOSPITAL_COMMUNITY): Payer: Self-pay

## 2011-08-07 DIAGNOSIS — F411 Generalized anxiety disorder: Secondary | ICD-10-CM

## 2011-08-07 DIAGNOSIS — K589 Irritable bowel syndrome without diarrhea: Secondary | ICD-10-CM | POA: Insufficient documentation

## 2011-08-07 DIAGNOSIS — R061 Stridor: Secondary | ICD-10-CM

## 2011-08-07 DIAGNOSIS — J45909 Unspecified asthma, uncomplicated: Secondary | ICD-10-CM | POA: Insufficient documentation

## 2011-08-07 LAB — CBC
Hemoglobin: 14.5 g/dL (ref 12.0–15.0)
MCH: 31.5 pg (ref 26.0–34.0)
MCV: 90.2 fL (ref 78.0–100.0)
Platelets: 165 10*3/uL (ref 150–400)
RBC: 4.61 MIL/uL (ref 3.87–5.11)
WBC: 6.7 10*3/uL (ref 4.0–10.5)

## 2011-08-07 LAB — BASIC METABOLIC PANEL
BUN: 10 mg/dL (ref 6–23)
CO2: 24 mEq/L (ref 19–32)
GFR calc Af Amer: >60 mL/min (ref >60)
Glucose, Bld: 110 mg/dL — ABNORMAL HIGH (ref 70–99)
Potassium: 3.9 mEq/L (ref 3.5–5.1)
Sodium: 135 mEq/L (ref 135–145)

## 2011-08-07 LAB — DIFFERENTIAL
Eosinophils Absolute: 0.6 10*3/uL (ref 0.0–0.7)
Lymphocytes Relative: 39 % (ref 12–46)
Lymphs Abs: 2.6 10*3/uL (ref 0.7–4.0)
Monocytes Relative: 8 % (ref 3–12)
Neutrophils Relative %: 44 % (ref 43–77)

## 2011-08-07 MED ORDER — LEVALBUTEROL HCL 0.63 MG/3ML IN NEBU: 0.6300 mg | INHALATION_SOLUTION | Freq: Once | RESPIRATORY_TRACT | Status: DC

## 2011-08-07 MED ORDER — ALBUTEROL SULFATE (5 MG/ML) 0.5% IN NEBU
5.0000 mg | INHALATION_SOLUTION | Freq: Once | RESPIRATORY_TRACT | Status: AC
  Administered 2011-08-07: 5 mg via RESPIRATORY_TRACT
  Filled 2011-08-07: qty 1

## 2011-08-07 MED ORDER — LORAZEPAM 2 MG/ML IJ SOLN
1.0000 mg | Freq: Once | INTRAMUSCULAR | Status: AC
  Administered 2011-08-07: 1 mg via INTRAVENOUS
  Filled 2011-08-07: qty 1

## 2011-08-07 MED ORDER — LORAZEPAM 1 MG PO TABS: 1.0000 mg | ORAL_TABLET | Freq: Two times a day (BID) | ORAL | Status: AC | PRN

## 2011-08-07 MED ORDER — PREDNISONE 10 MG PO TABS: 20.0000 mg | ORAL_TABLET | Freq: Two times a day (BID) | ORAL | Status: AC

## 2011-08-07 MED ORDER — PREDNISONE 20 MG PO TABS
20.0000 mg | ORAL_TABLET | Freq: Once | ORAL | Status: AC
  Administered 2011-08-07: 20 mg via ORAL
  Filled 2011-08-07: qty 1

## 2011-08-07 NOTE — ED Provider Notes (Signed)
History   Scribed for Hannah Lennert, MD, the patient was seen in room APA05/APA05. This chart was scribed by Clarita Crane. This patient's care was started at 7:55AM.   CSN: [id number] Arrival date & time: 08/07/2011  7:45 AM  Chief Complaint  Patient presents with  . Shortness of Breath   HPI Hannah Hays is a 45 y.o. female who presents to the Emergency Department complaining of intermittent moderate to severe SOB onset 2 days ago and persistent since with associated mild cough. States SOB is not aggravated by anything.  Denies sore throat, rhinorrhea, chest pain, numbness, tingling. Patient reports h/o asthma and states current symptoms are not relieved with use of home breathing treatments. Patient with additional h/o migraines, IBS, cholecystectomy, partial hysterectomy. Patient is a never smoker.    PCP- Lodema Hong  HPI ELEMENTS: Onset: 2 days ago Duration: persistent since onset  Timing: intermittent   Severity: moderate to severe  Modifying factors: Aggravated by nothing, relieved by nothing  Context:  as above  Associated symptoms: Mild cough. Denies sore throat, rhinorrhea, chest pain, numbness, tingling.    PAST MEDICAL HISTORY:  Past Medical History  Diagnosis Date  . Asthma   . Allergic rhinitis, seasonal   . Migraine headache 17    recurrence 1st time in 10 year in 2011  . IBS (irritable bowel syndrome)   . History of colonoscopy 2008    with simple adenoma  . Helicobacter pylori gastritis 2009    PAST SURGICAL HISTORY:  Past Surgical History  Procedure Date  . Partial hysterectomy 2002  . Tubal ligation 1995  . Cholecystectomy     MEDICATIONS:  Previous Medications   ALBUTEROL (PROVENTIL HFA) 108 (90 BASE) MCG/ACT INHALER    Inhale 2 puffs into the lungs every 6 (six) hours as needed.   AMLODIPINE (NORVASC) 2.5 MG TABLET    Take 1 tablet (2.5 mg total) by mouth daily.   BUDESONIDE-FORMOTEROL (SYMBICORT) 160-4.5 MCG/ACT INHALER    Inhale 2 puffs into the  lungs 2 (two) times daily.   FLUTICASONE (VERAMYST) 27.5 MCG/SPRAY NASAL SPRAY    2 sprays by Nasal route daily.     IBUPROFEN (ADVIL,MOTRIN) 600 MG TABLET    Take 600 mg by mouth 2 (two) times daily. Take one tablet by mouth twice daily as needed for severe headache    PREDNISONE, PAK, (STERAPRED) 5 MG TABS    Take 1 tablet (5 mg total) by mouth as directed.     ALLERGIES:  Allergies as of 08/07/2011 - Review Complete 06/28/2011  Allergen Reaction Noted  . Align  02/08/2011     FAMILY HISTORY:  Family History  Problem Relation Age of Onset  . Hypertension Mother   . Heart disease Mother   . Diabetes Mother   . Alcohol abuse Father   . Hypertension Father   . Hypertension Sister   . Kidney disease Sister   . Diabetes Sister   . Hypertension Sister   . Diabetes Sister   . Diabetes Brother   . Heart disease Brother   . Hypertension Brother      SOCIAL HISTORY: History   Social History  . Marital Status: Married    Spouse Name: N/A    Number of Children: N/A  . Years of Education: N/A   Occupational History  . employed equity     Social History Main Topics  . Smoking status: Never Smoker   . Smokeless tobacco: None  . Alcohol Use: No  .  Drug Use: No  . Sexually Active: None   Other Topics Concern  . None   Social History Narrative  . None     Review of Systems  Constitutional: Negative for fatigue.  HENT: Negative for congestion, sore throat, sinus pressure and ear discharge.   Eyes: Negative for discharge.  Respiratory: Positive for cough, shortness of breath, wheezing and stridor.   Cardiovascular: Negative for chest pain.  Gastrointestinal: Negative for abdominal pain and diarrhea.  Genitourinary: Negative for frequency and hematuria.  Musculoskeletal: Negative for back pain.  Skin: Negative for rash.  Neurological: Negative for seizures and headaches.  Hematological: Negative.   Psychiatric/Behavioral: Negative for hallucinations.    Physical  Exam  BP 159/90  Pulse 75  Temp(Src) 97.9 F (36.6 C) (Oral)  Resp 28  Ht 5' 5.5" (1.664 m)  Wt 120 lb (54.432 kg)  BMI 19.67 kg/m2  SpO2 97%  Physical Exam  Nursing note and vitals reviewed. Constitutional: She is oriented to person, place, and time. She appears well-developed and well-nourished.  HENT:  Head: Normocephalic and atraumatic.  Mouth/Throat: Oropharynx is clear and moist.  Eyes: Conjunctivae and EOM are normal. No scleral icterus.  Neck: Neck supple. No thyromegaly present.  Cardiovascular: Normal rate and regular rhythm.  Exam reveals no gallop and no friction rub.   No murmur heard. Pulmonary/Chest: Stridor (on expiration only) present. She has no wheezes. She has no rales.  Abdominal: Soft. She exhibits no distension. There is no tenderness.  Musculoskeletal: Normal range of motion. She exhibits no edema.  Lymphadenopathy:    She has no cervical adenopathy.  Neurological: She is alert and oriented to person, place, and time.  Skin: Skin is warm and dry.  Psychiatric: She has a normal mood and affect. Her behavior is normal.    ED Course  Procedures  OTHER DATA REVIEWED: Nursing notes, vital signs, and past medical records reviewed. Lab results reviewed and considered Imaging results reviewed and considered  DIAGNOSTIC STUDIES: Oxygen Saturation is 99% on room air, normal by my interpretation.    LABS / RADIOLOGY: Results for orders placed during the hospital encounter of 08/07/11  CBC      Component Value Range   WBC 6.7  4.0 - 10.5 (K/uL)   RBC 4.61  3.87 - 5.11 (MIL/uL)   Hemoglobin 14.5  12.0 - 15.0 (g/dL)   HCT 81.1  91.4 - 78.2 (%)   MCV 90.2  78.0 - 100.0 (fL)   MCH 31.5  26.0 - 34.0 (pg)   MCHC 34.9  30.0 - 36.0 (g/dL)   RDW 95.6  21.3 - 08.6 (%)   Platelets 165  150 - 400 (K/uL)  DIFFERENTIAL      Component Value Range   Neutrophils Relative 44  43 - 77 (%)   Neutro Abs 2.9  1.7 - 7.7 (K/uL)   Lymphocytes Relative 39  12 - 46 (%)    Lymphs Abs 2.6  0.7 - 4.0 (K/uL)   Monocytes Relative 8  3 - 12 (%)   Monocytes Absolute 0.5  0.1 - 1.0 (K/uL)   Eosinophils Relative 9 (*) 0 - 5 (%)   Eosinophils Absolute 0.6  0.0 - 0.7 (K/uL)   Basophils Relative 1  0 - 1 (%)   Basophils Absolute 0.0  0.0 - 0.1 (K/uL)  BASIC METABOLIC PANEL      Component Value Range   Sodium 135  135 - 145 (mEq/L)   Potassium 3.9  3.5 - 5.1 (mEq/L)  Chloride 100  96 - 112 (mEq/L)   CO2 24  19 - 32 (mEq/L)   Glucose, Bld 110 (*) 70 - 99 (mg/dL)   BUN 10  6 - 23 (mg/dL)   Creatinine, Ser 4.09  0.50 - 1.10 (mg/dL)   Calcium 9.4  8.4 - 81.1 (mg/dL)   GFR calc non Af Amer >60  >60 (mL/min)   GFR calc Af Amer >60  >60 (mL/min)   Dg Chest 2 View  08/07/2011  *RADIOLOGY REPORT*  Clinical Data: Shortness of breath.  History of asthma.  CHEST - 2 VIEW  Comparison: 10/24/2010  Findings: Two views of the chest were obtained.  There are bilateral nodules in the lower chest that are most compatible with nipple shadows.  Otherwise, the lungs are clear. Heart and mediastinum are within normal limits.  The trachea is midline. Bony structures are intact.  IMPRESSION: No acute chest findings.  Probable bilateral nipple shadows as described.  This finding can be confirmed with nipple markers.  Original Report Authenticated By: Richarda Overlie, M.D.    PROCEDURES:  ED COURSE / COORDINATION OF CARE: Orders Placed This Encounter  Procedures  . DG Chest 2 View  . CBC  . Differential  . Basic metabolic panel  . Maintain IV access  9:48PM- Patient resting comfortably at this time. [place] and informed of lab and imaging results. Patient's breath sounds improved since initial exam but mild inspiratory wheeze noted. Patient reports she is currently on Symbicort and Advair. Advised of intent to prescribe prednisone and recommend follow up with PCP. Patient agrees with plan set forth at this time.  mdm stridor and anxiety  PLAN:  The patient is to return the emergency  department if there is any worsening of symptoms. I have reviewed the discharge instructions with the patient/family  CONDITION ON DISCHARGE: good   MEDICATIONS GIVEN IN THE E.D.  Medications  predniSONE (DELTASONE) tablet 20 mg (not administered)  predniSONE (DELTASONE) 10 MG tablet (not administered)  LORazepam (ATIVAN) 1 MG tablet (not administered)  albuterol (PROVENTIL) (5 MG/ML) 0.5% nebulizer solution 5 mg (5 mg Nebulization Given 08/07/11 0813)  LORazepam (ATIVAN) injection 1 mg (1 mg Intravenous Given 08/07/11 0821)     The chart was scribed for me under my direct supervision.  I personally performed the history, physical, and medical decision making and all procedures in the evaluation of this patient.Marland Kitchen }   Hannah Lennert, MD 08/07/11 1006

## 2011-08-07 NOTE — ED Notes (Signed)
Pt reports a hx of asthma.  Pt has been sob since Saturday but has been taking breathing treatments at home.  Pt reports "its just not helping".  Pt has labored breathing in triage.

## 2011-08-16 ENCOUNTER — Encounter: Payer: Self-pay | Admitting: Family Medicine

## 2011-08-18 ENCOUNTER — Ambulatory Visit (INDEPENDENT_AMBULATORY_CARE_PROVIDER_SITE_OTHER): Payer: PRIVATE HEALTH INSURANCE | Admitting: Family Medicine

## 2011-08-18 ENCOUNTER — Encounter: Payer: Self-pay | Admitting: Family Medicine

## 2011-08-18 VITALS — BP 150/90 | HR 72 | Temp 98.9°F | Resp 16 | Ht 65.75 in | Wt 117.1 lb

## 2011-08-18 DIAGNOSIS — R5383 Other fatigue: Secondary | ICD-10-CM

## 2011-08-18 DIAGNOSIS — R5381 Other malaise: Secondary | ICD-10-CM

## 2011-08-18 DIAGNOSIS — R7301 Impaired fasting glucose: Secondary | ICD-10-CM

## 2011-08-18 DIAGNOSIS — R634 Abnormal weight loss: Secondary | ICD-10-CM

## 2011-08-18 DIAGNOSIS — J45909 Unspecified asthma, uncomplicated: Secondary | ICD-10-CM

## 2011-08-18 DIAGNOSIS — J301 Allergic rhinitis due to pollen: Secondary | ICD-10-CM

## 2011-08-18 DIAGNOSIS — I10 Essential (primary) hypertension: Secondary | ICD-10-CM

## 2011-08-18 LAB — HEMOGLOBIN A1C: Hgb A1c MFr Bld: 5.7 % — ABNORMAL HIGH (ref <5.7)

## 2011-08-18 MED ORDER — METHYLPREDNISOLONE ACETATE 80 MG/ML IJ SUSP
80.0000 mg | Freq: Once | INTRAMUSCULAR | Status: AC
  Administered 2011-08-18: 80 mg via INTRAMUSCULAR

## 2011-08-18 NOTE — Progress Notes (Signed)
  Subjective:    Patient ID: Hannah Hays, female    DOB: 1966-06-20, 45 y.o.   MRN: [id number]  HPI 2 week h/o uncontrolled wheezing, states she is having to use her neb machine 3 times daily and the Proventil MDI "back to back" while at work. There has been no change in her work environment. She has no fever or chills, no sinus pressure , but has noted increased nasal congestion Review of Systems See HPI Denies recent fever or chills. Denies sinus pressure, nasal congestion, ear pain or sore throat. Denies chest congestion or  productive cough  Denies chest pains, palpitations and leg swelling Denies abdominal pain, nausea, vomiting,diarrhea or constipation.   Denies dysuria, frequency, hesitancy or incontinence. Denies joint pain, swelling and limitation in mobility. Denies headaches, seizures, numbness, or tingling. Denies depression, anxiety or insomnia. Denies skin break down or rash.        Objective:   Physical Exam Patient alert and oriented and in no cardiopulmonary distress.  HEENT: No facial asymmetry, EOMI, no sinus tenderness,  oropharynx pink and moist.  Neck supple no adenopathy.  Chest: decreased air entry, with wheezing  CVS: S1, S2 no murmurs, no S3.  ABD: Soft non tender. Bowel sounds normal.  Ext: No edema  MS: Adequate ROM spine, shoulders, hips and knees.  Skin: Intact, no ulcerations or rash noted.  Psych: Good eye contact, normal affect. Memory intact not anxious or depressed appearing.  CNS: CN 2-12 intact, power, tone and sensation normal throughout.        Assessment & Plan:

## 2011-08-18 NOTE — Patient Instructions (Addendum)
F/u in 2.5 months.  You are being referred to Dr Juanetta Gosling to evalute and help with asthma management.  You wiill get an injection of depomedrol today in the office.  Labs today.  Make a concious effort to increase your caloric intake every day  I hope you feel better soon

## 2011-08-19 DIAGNOSIS — I10 Essential (primary) hypertension: Secondary | ICD-10-CM | POA: Insufficient documentation

## 2011-08-19 NOTE — Assessment & Plan Note (Signed)
Uncontrolled. Medication compliance addressed. Commitment to regular exercise, and healthy  eating habits with portion control discussed. DASH diet, and low fat diet discussed, and literature offered. No changes in medication at this time.  

## 2011-08-19 NOTE — Assessment & Plan Note (Signed)
Uncontrolled, and the likely trigger for increased asthma symptoms. Saw her allergist 2 days ago and has been started on dulera in place of symbicort

## 2011-08-19 NOTE — Assessment & Plan Note (Signed)
Uncontrolled, worsening symptoms in the past 3 weeks, will refer yo pulmonary. States in The ED she was told she was having an anxiety attack, denies any increased  Stress or feelings of anxiety or panic

## 2011-08-23 ENCOUNTER — Telehealth: Payer: Self-pay | Admitting: *Deleted

## 2011-08-23 NOTE — Telephone Encounter (Signed)
Called patient, left message.

## 2011-08-23 NOTE — Telephone Encounter (Signed)
Message copied by Diamantina Monks on Wed Aug 23, 2011  1:18 PM ------      Message from: Syliva Overman MD E      Created: Sat Aug 19, 2011  9:19 AM       pls advise thyroid function is normal, she is not diabetic. Her blood sugars are just a little above normal, but I doo believe this is because of the amount of prednisone she has needed recently for her asthma

## 2011-08-23 NOTE — Telephone Encounter (Signed)
Message copied by Diamantina Monks on Wed Aug 23, 2011  9:52 AM ------      Message from: Syliva Overman MD E      Created: Sat Aug 19, 2011  9:19 AM       pls advise thyroid function is normal, she is not diabetic. Her blood sugars are just a little above normal, but I doo believe this is because of the amount of prednisone she has needed recently for her asthma

## 2011-08-23 NOTE — Telephone Encounter (Signed)
Patient aware.

## 2011-10-03 ENCOUNTER — Ambulatory Visit (HOSPITAL_COMMUNITY)
Admission: RE | Admit: 2011-10-03 | Discharge: 2011-10-03 | Disposition: A | Discharge status: Home / Self Care | Payer: 59 | Source: Ambulatory Visit | Attending: Pulmonary Disease | Admitting: Pulmonary Disease

## 2011-10-03 ENCOUNTER — Encounter (HOSPITAL_COMMUNITY): Admission: RE | Admit: 2011-10-03 | Payer: 59 | Source: Ambulatory Visit

## 2011-10-03 DIAGNOSIS — R0602 Shortness of breath: Secondary | ICD-10-CM | POA: Insufficient documentation

## 2011-10-03 LAB — PULMONARY FUNCTION TEST

## 2011-10-03 MED ORDER — ALBUTEROL SULFATE (5 MG/ML) 0.5% IN NEBU
2.5000 mg | INHALATION_SOLUTION | Freq: Once | RESPIRATORY_TRACT | Status: AC
  Administered 2011-10-03: 2.5 mg via RESPIRATORY_TRACT

## 2011-10-06 NOTE — Procedures (Signed)
Hannah Hays, Hannah Hays                ACCOUNT NO.:  [id number]  MEDICAL RECORD NO.:  [id number]  LOCATION:  RESP                          FACILITY:  [facility]  PHYSICIAN:  Tashaun Obey L. Juanetta Gosling, M.D.DATE OF BIRTH:  April 02, 1966  DATE OF PROCEDURE: DATE OF DISCHARGE:                           PULMONARY FUNCTION TEST   REASON FOR PULMONARY FUNCTION TESTING:  Asthma.  1. Spirometry shows a moderate ventilatory defect with airflow     obstruction. 2. Lung volumes are essentially normal. 3. DLCO is mildly reduced.     Caillou Minus L. Juanetta Gosling, M.D.     [facility]/MED[facility]  D:  10/04/2011  T:  10/04/2011  Job:  [id number]

## 2011-10-18 ENCOUNTER — Encounter: Payer: Self-pay | Admitting: Family Medicine

## 2011-10-19 ENCOUNTER — Encounter: Payer: Self-pay | Admitting: Family Medicine

## 2011-10-19 ENCOUNTER — Ambulatory Visit (INDEPENDENT_AMBULATORY_CARE_PROVIDER_SITE_OTHER): Payer: PRIVATE HEALTH INSURANCE | Admitting: Family Medicine

## 2011-10-19 VITALS — BP 140/90 | HR 58 | Temp 97.8°F | Resp 16 | Ht 65.75 in | Wt 118.4 lb

## 2011-10-19 DIAGNOSIS — J322 Chronic ethmoidal sinusitis: Secondary | ICD-10-CM

## 2011-10-19 DIAGNOSIS — I10 Essential (primary) hypertension: Secondary | ICD-10-CM

## 2011-10-19 DIAGNOSIS — J301 Allergic rhinitis due to pollen: Secondary | ICD-10-CM

## 2011-10-19 DIAGNOSIS — J45909 Unspecified asthma, uncomplicated: Secondary | ICD-10-CM

## 2011-10-19 MED ORDER — AMLODIPINE BESYLATE 5 MG PO TABS: 5.0000 mg | ORAL_TABLET | Freq: Every day | ORAL | Status: DC

## 2011-10-19 MED ORDER — METHYLPREDNISOLONE ACETATE PF 80 MG/ML IJ SUSP
80.0000 mg | Freq: Once | INTRAMUSCULAR | Status: AC
  Administered 2011-10-19: 80 mg via INTRAMUSCULAR

## 2011-10-19 MED ORDER — PENICILLIN V POTASSIUM 500 MG PO TABS: 500.0000 mg | ORAL_TABLET | Freq: Three times a day (TID) | ORAL | Status: AC

## 2011-10-19 MED ORDER — PREDNISONE (PAK) 5 MG PO TABS: 5.0000 mg | ORAL_TABLET | ORAL | Status: DC

## 2011-10-19 NOTE — Patient Instructions (Addendum)
F/U in mid January.Cancel sooner appt  Blood pressure is still high, the dose of amlodipine is increased, ok to take tWO 2.5 mg tablets till done   Prednisone and penicillin are prescribed and you will get depo medrol in the office also.  You are being referred to ENT in January, Dr Suszanne Conners

## 2011-10-19 NOTE — Progress Notes (Signed)
  Subjective:    Patient ID: RUT BETTERTON, female    DOB: [birth year], 45 y.o.   MRN: [id number]  HPI 1 month h/o excessive head congestion, right nasal pressure, has been told in the past she needs surgery, had malodorous nasal drainage, unable to breathe comfortably, worse at night.Excessive night cough till stomach was sore, followed both by pulmonary and allergist, now at the point she realizes she needs surgery, but plans to hold off till next year. No adverse side effects noted with amlodipine which is new, and she is here for BP re eval   Review of Systems See HPI Denies chest pains, palpitations and leg swelling Denies abdominal pain, nausea, vomiting,diarrhea or constipation.   Denies dysuria, frequency, hesitancy or incontinence. Denies joint pain, swelling and limitation in mobility. Denies headaches, seizures, numbness, or tingling. Denies depression, anxiety or insomnia. Denies skin break down or rash.        Objective:   Physical Exam Patient alert and oriented and in no cardiopulmonary distress.  HEENT: No facial asymmetry, EOMI, right ethmoid sinus tenderness,  oropharynx pink and moist.  Neck supple no adenopathy.  Chest: Clear to auscultation bilaterally.  CVS: S1, S2 no murmurs, no S3.  ABD: Soft non tender. Bowel sounds normal.  Ext: No edema  MS: Adequate ROM spine, shoulders, hips and knees.  Skin: Intact, no ulcerations or rash noted.  Psych: Good eye contact, normal affect. Memory intact not anxious or depressed appearing.  CNS: CN 2-12 intact, power, tone and sensation normal throughout.        Assessment & Plan:

## 2011-10-19 NOTE — Assessment & Plan Note (Signed)
Uncontrolled , inc dose of amlodipine

## 2011-10-22 NOTE — Assessment & Plan Note (Signed)
Persistent pressure, infection aggravating asthma also. Has been told in the past that she needs surgery will see ENT in January, in the interim symptomatic treatment

## 2011-10-22 NOTE — Assessment & Plan Note (Signed)
Unchanged, recently eval by pulmonary

## 2011-10-22 NOTE — Assessment & Plan Note (Signed)
Uncontrolled, dose inc in medication

## 2011-10-22 NOTE — Assessment & Plan Note (Signed)
Follows with allergist 

## 2011-11-01 ENCOUNTER — Ambulatory Visit: Payer: PRIVATE HEALTH INSURANCE | Admitting: Family Medicine

## 2011-12-04 ENCOUNTER — Encounter: Payer: Self-pay | Admitting: Family Medicine

## 2011-12-14 ENCOUNTER — Ambulatory Visit (INDEPENDENT_AMBULATORY_CARE_PROVIDER_SITE_OTHER): Payer: PRIVATE HEALTH INSURANCE | Admitting: Otolaryngology

## 2011-12-18 ENCOUNTER — Encounter: Payer: Self-pay | Admitting: Family Medicine

## 2011-12-19 ENCOUNTER — Ambulatory Visit (INDEPENDENT_AMBULATORY_CARE_PROVIDER_SITE_OTHER): Payer: PRIVATE HEALTH INSURANCE | Admitting: Family Medicine

## 2011-12-19 ENCOUNTER — Encounter: Payer: Self-pay | Admitting: Family Medicine

## 2011-12-19 ENCOUNTER — Other Ambulatory Visit: Payer: Self-pay | Admitting: Family Medicine

## 2011-12-19 VITALS — BP 120/80 | HR 56 | Resp 16 | Ht 65.75 in | Wt 118.0 lb

## 2011-12-19 DIAGNOSIS — K219 Gastro-esophageal reflux disease without esophagitis: Secondary | ICD-10-CM

## 2011-12-19 DIAGNOSIS — J301 Allergic rhinitis due to pollen: Secondary | ICD-10-CM

## 2011-12-19 DIAGNOSIS — G43909 Migraine, unspecified, not intractable, without status migrainosus: Secondary | ICD-10-CM

## 2011-12-19 DIAGNOSIS — I10 Essential (primary) hypertension: Secondary | ICD-10-CM

## 2011-12-19 DIAGNOSIS — Z139 Encounter for screening, unspecified: Secondary | ICD-10-CM

## 2011-12-19 DIAGNOSIS — E785 Hyperlipidemia, unspecified: Secondary | ICD-10-CM

## 2011-12-19 MED ORDER — TOPIRAMATE 25 MG PO TABS: 25.0000 mg | ORAL_TABLET | Freq: Two times a day (BID) | ORAL | Status: DC

## 2011-12-19 NOTE — Patient Instructions (Signed)
F/U in 2.5 months.  New med for migraines, take one at night for 1 week , then increase to one twice daily. This is to reduce frequency of headaches.  Blood pressure is excellent, continue current med.  If adverse reaction to new med occurs, stop and notify me

## 2011-12-19 NOTE — Assessment & Plan Note (Signed)
Controlled, no change in medication  

## 2011-12-19 NOTE — Assessment & Plan Note (Signed)
Twice weekly x 1 year, add topamax

## 2011-12-23 NOTE — Progress Notes (Signed)
  Subjective:    Patient ID: Hannah Hays, female    DOB: 1966-02-25, 46 y.o.   MRN: [id number]  HPI The PT is here for follow up and re-evaluation of chronic medical conditions,specificall hypertension, for which she was started on medication at her last visit.She is also here for  medication management and review of any available recent lab and radiology data.  Preventive health is updated, specifically  Cancer screening and Immunization.   Questions or concerns regarding consultations or procedures which the PT has had in the interim are  addressed. The PT denies any adverse reactions to current medications since the last visit.  There are no new concerns.  There are no specific complaints  Continues to suffer from significant nasal congestion and difficulty breathing, thinking seriously about surgery. Also c/o headaches fairly often, willing to try a triptan   Review of Systems See HPI Denies recent fever or chills. Denies  ear pain or sore throat. Denies chest congestion, productive cough or wheezing. Denies chest pains, palpitations and leg swelling Denies abdominal pain, nausea, vomiting,diarrhea or constipation.   Denies dysuria, frequency, hesitancy or incontinence. Denies joint pain, swelling and limitation in mobility. Denies  seizures, numbness, or tingling. Denies depression, anxiety or insomnia. Denies skin break down or rash.        Objective:   Physical Exam Patient alert and oriented and in no cardiopulmonary distress.  HEENT: No facial asymmetry, EOMI, no sinus tenderness,  oropharynx pink and moist.  Neck supple no adenopathy.Erythema and edema of  Nasal mucosa  Chest: Clear to auscultation bilaterally.  CVS: S1, S2 no murmurs, no S3.  ABD: Soft non tender. Bowel sounds normal.  Ext: No edema  MS: Adequate ROM spine, shoulders, hips and knees.  Skin: Intact, no ulcerations or rash noted.  Psych: Good eye contact, normal affect. Memory intact not  anxious or depressed appearing.  CNS: CN 2-12 intact, power, tone and sensation normal throughout.        Assessment & Plan:

## 2011-12-23 NOTE — Assessment & Plan Note (Signed)
Maintained on chronic meds with fair result, has narrowed nasal ostia and is considering surgery which had been proposed in the past

## 2011-12-23 NOTE — Assessment & Plan Note (Signed)
Low fat diet stressed to educe LDL discussed, LDL elevated, no med indicated currently

## 2011-12-23 NOTE — Assessment & Plan Note (Signed)
Asymptomatic and on no chronic med at this time

## 2012-01-08 ENCOUNTER — Ambulatory Visit (HOSPITAL_COMMUNITY)
Admission: RE | Admit: 2012-01-08 | Discharge: 2012-01-08 | Disposition: A | Discharge status: Home / Self Care | Payer: 59 | Source: Ambulatory Visit | Attending: Family Medicine | Admitting: Family Medicine

## 2012-01-08 DIAGNOSIS — Z1231 Encounter for screening mammogram for malignant neoplasm of breast: Secondary | ICD-10-CM | POA: Insufficient documentation

## 2012-01-08 DIAGNOSIS — Z139 Encounter for screening, unspecified: Secondary | ICD-10-CM

## 2012-01-11 ENCOUNTER — Ambulatory Visit (INDEPENDENT_AMBULATORY_CARE_PROVIDER_SITE_OTHER): Payer: PRIVATE HEALTH INSURANCE | Admitting: Otolaryngology

## 2012-01-11 DIAGNOSIS — J330 Polyp of nasal cavity: Secondary | ICD-10-CM

## 2012-01-11 DIAGNOSIS — J310 Chronic rhinitis: Secondary | ICD-10-CM

## 2012-01-11 DIAGNOSIS — J343 Hypertrophy of nasal turbinates: Secondary | ICD-10-CM

## 2012-01-12 ENCOUNTER — Other Ambulatory Visit (INDEPENDENT_AMBULATORY_CARE_PROVIDER_SITE_OTHER): Payer: Self-pay | Admitting: Otolaryngology

## 2012-01-12 DIAGNOSIS — J310 Chronic rhinitis: Secondary | ICD-10-CM

## 2012-01-24 ENCOUNTER — Ambulatory Visit (HOSPITAL_COMMUNITY): Payer: 59

## 2012-02-01 ENCOUNTER — Ambulatory Visit (INDEPENDENT_AMBULATORY_CARE_PROVIDER_SITE_OTHER): Payer: 59 | Admitting: Otolaryngology

## 2012-02-01 DIAGNOSIS — J330 Polyp of nasal cavity: Secondary | ICD-10-CM

## 2012-02-01 DIAGNOSIS — J320 Chronic maxillary sinusitis: Secondary | ICD-10-CM

## 2012-02-01 DIAGNOSIS — J322 Chronic ethmoidal sinusitis: Secondary | ICD-10-CM

## 2012-02-20 ENCOUNTER — Ambulatory Visit (INDEPENDENT_AMBULATORY_CARE_PROVIDER_SITE_OTHER): Payer: 59 | Admitting: Family Medicine

## 2012-02-20 ENCOUNTER — Encounter: Payer: Self-pay | Admitting: Family Medicine

## 2012-02-20 VITALS — BP 130/72 | HR 112 | Resp 22 | Ht 65.75 in | Wt 115.1 lb

## 2012-02-20 DIAGNOSIS — L309 Dermatitis, unspecified: Secondary | ICD-10-CM | POA: Insufficient documentation

## 2012-02-20 DIAGNOSIS — J45909 Unspecified asthma, uncomplicated: Secondary | ICD-10-CM

## 2012-02-20 DIAGNOSIS — I889 Nonspecific lymphadenitis, unspecified: Secondary | ICD-10-CM

## 2012-02-20 DIAGNOSIS — I10 Essential (primary) hypertension: Secondary | ICD-10-CM

## 2012-02-20 DIAGNOSIS — L259 Unspecified contact dermatitis, unspecified cause: Secondary | ICD-10-CM

## 2012-02-20 MED ORDER — ALBUTEROL SULFATE (5 MG/ML) 0.5% IN NEBU
2.5000 mg | INHALATION_SOLUTION | Freq: Once | RESPIRATORY_TRACT | Status: AC
  Administered 2012-02-20: 2.5 mg via RESPIRATORY_TRACT

## 2012-02-20 MED ORDER — SULFAMETHOXAZOLE-TRIMETHOPRIM 800-160 MG PO TABS: 1.0000 | ORAL_TABLET | Freq: Two times a day (BID) | ORAL | Status: AC

## 2012-02-20 MED ORDER — FLUCONAZOLE 150 MG PO TABS: ORAL_TABLET | ORAL | Status: AC

## 2012-02-20 MED ORDER — IPRATROPIUM BROMIDE 0.02 % IN SOLN
0.5000 mg | Freq: Once | RESPIRATORY_TRACT | Status: AC
  Administered 2012-02-20: 0.5 mg via RESPIRATORY_TRACT

## 2012-02-20 MED ORDER — HYDROXYZINE HCL 10 MG PO TABS: 10.0000 mg | ORAL_TABLET | Freq: Three times a day (TID) | ORAL | Status: AC | PRN

## 2012-02-20 MED ORDER — METHYLPREDNISOLONE ACETATE 80 MG/ML IJ SUSP
120.0000 mg | Freq: Once | INTRAMUSCULAR | Status: AC
  Administered 2012-02-20: 120 mg via INTRAMUSCULAR

## 2012-02-20 MED ORDER — PREDNISONE (PAK) 5 MG PO TABS: 5.0000 mg | ORAL_TABLET | ORAL | Status: DC

## 2012-02-20 NOTE — Assessment & Plan Note (Signed)
Pruritic rash on palms and in elbow creases x approx 1 week, no new detergents, unclear trigger, pred and hydroxyzine

## 2012-02-20 NOTE — Patient Instructions (Signed)
F/u in 3 weeks.  You are treated for asthma flare and dermatitis.  Work excuse from 03/18 to return 02/21/2012.  Antibiotic is prescribed for the swollen glans in the back of your neck these will be re evaluated in 3 weeks, if still present you will have evaluation by surgery

## 2012-02-20 NOTE — Assessment & Plan Note (Signed)
Acute fllare x 3 days, neb treatment in office, depo medrol 120mg  and pred dose pack, return to work in am

## 2012-02-21 ENCOUNTER — Telehealth: Payer: Self-pay | Admitting: Family Medicine

## 2012-02-21 NOTE — Telephone Encounter (Signed)
Changed work note for patient

## 2012-02-21 NOTE — Assessment & Plan Note (Signed)
Tender occipital adenitis x 3 days, will treat with antibiotics and reases, left node enlgd diameter approx 2 cm

## 2012-02-21 NOTE — Assessment & Plan Note (Signed)
Controlled, no change in medication  

## 2012-02-21 NOTE — Progress Notes (Signed)
  Subjective:    Patient ID: Hannah Hays, female    DOB: [birth year], 46 y.o.   MRN: [id number]  HPI 3 day h/o increased chest tightness with wheezing, had to leave work yesterday as a result. Denies fever or chills, increased difficulty breathing through swollen nostrils with chronic sinusitis. States she may need surgery in this regard. Pruritc red flat rash on right hand x 2 days, no clear inciting factor   Review of Systems See HPI Denies chest pains, palpitations and leg swelling Denies abdominal pain, nausea, vomiting,diarrhea or constipation.   Denies dysuria, frequency, hesitancy or incontinence. Denies joint pain, swelling and limitation in mobility. Denies headaches, seizures, numbness, or tingling. Denies depression, anxiety or insomnia.       Objective:   Physical Exam  Patient alert and oriented and in no cardiopulmonary distress.  HEENT: No facial asymmetry, EOMI, no sinus tenderness,  oropharynx pink and moist.  Neck supple Occipital tender adenopathy, left greater than right. Nasal mucosa erythematous and edematous Chest: decreased air entry, bilateral wheezes CVS: S1, S2 no murmurs, no S3.  ABD: Soft non tender. Bowel sounds normal.  Ext: No edema  MS: Adequate ROM spine, shoulders, hips and knees.  Skin: erythematous macular rash on right upper extremity Psych: Good eye contact, normal affect. Memory intact not anxious or depressed appearing.  CNS: CN 2-12 intact, power, tone and sensation normal throughout.       Assessment & Plan:

## 2012-03-12 ENCOUNTER — Ambulatory Visit (INDEPENDENT_AMBULATORY_CARE_PROVIDER_SITE_OTHER): Payer: 59 | Admitting: Family Medicine

## 2012-03-12 ENCOUNTER — Telehealth: Payer: Self-pay | Admitting: Family Medicine

## 2012-03-12 ENCOUNTER — Encounter: Payer: Self-pay | Admitting: Family Medicine

## 2012-03-12 VITALS — BP 124/82 | HR 78 | Resp 16 | Ht 65.75 in | Wt 114.0 lb

## 2012-03-12 DIAGNOSIS — J45909 Unspecified asthma, uncomplicated: Secondary | ICD-10-CM

## 2012-03-12 DIAGNOSIS — I10 Essential (primary) hypertension: Secondary | ICD-10-CM

## 2012-03-12 DIAGNOSIS — R7301 Impaired fasting glucose: Secondary | ICD-10-CM

## 2012-03-12 DIAGNOSIS — G43909 Migraine, unspecified, not intractable, without status migrainosus: Secondary | ICD-10-CM

## 2012-03-12 DIAGNOSIS — R5383 Other fatigue: Secondary | ICD-10-CM

## 2012-03-12 DIAGNOSIS — J301 Allergic rhinitis due to pollen: Secondary | ICD-10-CM

## 2012-03-12 DIAGNOSIS — Z139 Encounter for screening, unspecified: Secondary | ICD-10-CM

## 2012-03-12 DIAGNOSIS — E785 Hyperlipidemia, unspecified: Secondary | ICD-10-CM

## 2012-03-12 DIAGNOSIS — R5381 Other malaise: Secondary | ICD-10-CM

## 2012-03-12 MED ORDER — MOMETASONE FURO-FORMOTEROL FUM 100-5 MCG/ACT IN AERO: 2.0000 | INHALATION_SPRAY | Freq: Two times a day (BID) | RESPIRATORY_TRACT | Status: DC

## 2012-03-12 MED ORDER — TOPIRAMATE 50 MG PO TABS: 50.0000 mg | ORAL_TABLET | Freq: Two times a day (BID) | ORAL | Status: DC

## 2012-03-12 MED ORDER — IBUPROFEN 800 MG PO TABS: ORAL_TABLET | ORAL | Status: DC

## 2012-03-12 NOTE — Patient Instructions (Signed)
F/u in 4 month.  Please call if you need me before  Increase dose of topamax to 50mg  twice daily. OK to take TWO 25 mg tablets twice daily till done.   HBA1C, fasting lipid and vit D today.  Blood pressure and asthma are good today

## 2012-03-12 NOTE — Progress Notes (Signed)
  Subjective:    Patient ID: Hannah Hays, female    DOB: 09-08-1966, 46 y.o.   MRN: [id number]  HPI The PT is here for follow up and re-evaluation of chronic medical conditions, medication management and review of any available recent lab and radiology data.  Preventive health is updated, specifically  Cancer screening and Immunization.   Questions or concerns regarding consultations or procedures which the PT has had in the interim are  addressed. The PT denies any adverse reactions to current medications since the last visit.  Reports improvement in frequency and severity of headaches, but still averaging 3 to 4 per month. No recent asthma flares and allergies are fairly stable    Review of Systems See HPI Denies recent fever or chills. Denies sinus pressure, nasal congestion, ear pain or sore throat. Denies chest congestion, productive cough or wheezing. Denies chest pains, palpitations and leg swelling Denies abdominal pain, nausea, vomiting,diarrhea or constipation.   Denies dysuria, frequency, hesitancy or incontinence. Denies joint pain, swelling and limitation in mobility. Denies seizures, numbness, or tingling. Denies depression, anxiety or insomnia. Denies skin break down or rash.         Objective:   Physical Exam Patient alert and oriented and in no cardiopulmonary distress.  HEENT: No facial asymmetry, EOMI, no sinus tenderness,  oropharynx pink and moist.  Neck supple no adenopathy.  Chest: Clear to auscultation bilaterally.  CVS: S1, S2 no murmurs, no S3.  ABD: Soft non tender. Bowel sounds normal.  Ext: No edema  MS: Adequate ROM spine, shoulders, hips and knees.  Skin: Intact, no ulcerations or rash noted.  Psych: Good eye contact, normal affect. Memory intact not anxious or depressed appearing.  CNS: CN 2-12 intact, power, tone and sensation normal throughout.        Assessment & Plan:

## 2012-03-12 NOTE — Telephone Encounter (Signed)
Med sent in.

## 2012-03-12 NOTE — Assessment & Plan Note (Signed)
Improved, but still averages 3 to 4 per month

## 2012-03-13 LAB — LIPID PANEL
LDL Cholesterol: 129 mg/dL — ABNORMAL HIGH (ref 0–99)
VLDL: 10 mg/dL (ref 0–40)

## 2012-03-19 ENCOUNTER — Ambulatory Visit: Payer: PRIVATE HEALTH INSURANCE | Admitting: Family Medicine

## 2012-03-25 NOTE — Assessment & Plan Note (Signed)
Continue medications and f/u with allergist

## 2012-03-25 NOTE — Assessment & Plan Note (Signed)
Controlled, no change in medication  

## 2012-03-25 NOTE — Assessment & Plan Note (Signed)
Improved, normal hBA1C when checked most recently

## 2012-03-25 NOTE — Assessment & Plan Note (Signed)
Hyperlipidemia:Low fat diet discussed and encouraged.   

## 2012-05-02 ENCOUNTER — Ambulatory Visit (INDEPENDENT_AMBULATORY_CARE_PROVIDER_SITE_OTHER): Payer: 59 | Admitting: Otolaryngology

## 2012-05-02 DIAGNOSIS — J310 Chronic rhinitis: Secondary | ICD-10-CM

## 2012-05-02 DIAGNOSIS — J342 Deviated nasal septum: Secondary | ICD-10-CM

## 2012-05-02 DIAGNOSIS — J330 Polyp of nasal cavity: Secondary | ICD-10-CM

## 2012-07-12 ENCOUNTER — Encounter: Payer: Self-pay | Admitting: Family Medicine

## 2012-07-12 ENCOUNTER — Ambulatory Visit (INDEPENDENT_AMBULATORY_CARE_PROVIDER_SITE_OTHER): Payer: 59 | Admitting: Family Medicine

## 2012-07-12 VITALS — BP 132/70 | HR 56 | Resp 18 | Ht 65.75 in | Wt 114.1 lb

## 2012-07-12 DIAGNOSIS — J301 Allergic rhinitis due to pollen: Secondary | ICD-10-CM

## 2012-07-12 DIAGNOSIS — E785 Hyperlipidemia, unspecified: Secondary | ICD-10-CM

## 2012-07-12 DIAGNOSIS — E559 Vitamin D deficiency, unspecified: Secondary | ICD-10-CM | POA: Insufficient documentation

## 2012-07-12 DIAGNOSIS — M949 Disorder of cartilage, unspecified: Secondary | ICD-10-CM

## 2012-07-12 DIAGNOSIS — I10 Essential (primary) hypertension: Secondary | ICD-10-CM

## 2012-07-12 DIAGNOSIS — R634 Abnormal weight loss: Secondary | ICD-10-CM

## 2012-07-12 DIAGNOSIS — R519 Headache, unspecified: Secondary | ICD-10-CM | POA: Insufficient documentation

## 2012-07-12 DIAGNOSIS — M899 Disorder of bone, unspecified: Secondary | ICD-10-CM

## 2012-07-12 DIAGNOSIS — G43909 Migraine, unspecified, not intractable, without status migrainosus: Secondary | ICD-10-CM

## 2012-07-12 DIAGNOSIS — J45909 Unspecified asthma, uncomplicated: Secondary | ICD-10-CM

## 2012-07-12 DIAGNOSIS — R51 Headache: Secondary | ICD-10-CM | POA: Insufficient documentation

## 2012-07-12 MED ORDER — PREDNISONE (PAK) 5 MG PO TABS: 5.0000 mg | ORAL_TABLET | ORAL | Status: DC

## 2012-07-12 MED ORDER — METHYLPREDNISOLONE ACETATE 80 MG/ML IJ SUSP
80.0000 mg | Freq: Once | INTRAMUSCULAR | Status: AC
  Administered 2012-07-12: 80 mg via INTRAMUSCULAR

## 2012-07-12 NOTE — Patient Instructions (Addendum)
F/u in 6 months.  You will get depo medrol 80mg  in the office and a prednisone dose pack is prescribed due to severe allergies.  You NEED to start calcium with D 1200mg  /100IU one daily, gel capsule and continue the daily multivitamin. Your vitamin D level is low  Pls cut back on fried and fatty foods  Fasting CBC, lipid , chem 7, TSH and vit D in 6 months 3 to 5 days prior to visit

## 2012-07-12 NOTE — Assessment & Plan Note (Signed)
Uncontrolled, depo medrol 80 mg in office and prednisone dose pack

## 2012-07-12 NOTE — Assessment & Plan Note (Signed)
Hyperlipidemia:Low fat diet discussed and encouraged.  No med at this time 

## 2012-07-12 NOTE — Addendum Note (Signed)
Addended by: Kandis Fantasia B on: 07/12/2012 08:54 AM   Modules accepted: Orders

## 2012-07-12 NOTE — Assessment & Plan Note (Signed)
Controlled, no change in medication  

## 2012-07-12 NOTE — Assessment & Plan Note (Signed)
Reduced frequency and severity with topamax, continue same

## 2012-07-12 NOTE — Progress Notes (Signed)
  Subjective:    Patient ID: Hannah Hays, female    DOB: November 27, 1966, 46 y.o.   MRN: [id number]  HPI The PT is here for follow up and re-evaluation of chronic medical conditions, medication management and review of any available recent lab and radiology data.  Preventive health is updated, specifically  Cancer screening and Immunization.   Questions or concerns regarding consultations or procedures which the PT has had in the interim are  addressed. The PT denies any adverse reactions to current medications since the last visit.  There are no new concerns.  1 week h/o increased nasal congestion, with difficulty breathing and sneezing excessively. Drainage is clear    Review of Systems See HPI Denies recent fever or chills. . Denies chest congestion, productive cough or wheezing. Denies chest pains, palpitations and leg swelling Denies abdominal pain, nausea, vomiting,diarrhea or constipation.   Denies dysuria, frequency, hesitancy or incontinence. Denies joint pain, swelling and limitation in mobility. Denies uncontrolled  headaches, seizures, numbness, or tingling. Denies depression, anxiety or insomnia. Denies skin break down or rash.        Objective:   Physical Exam  Patient alert and oriented and in no cardiopulmonary distress.  HEENT: No facial asymmetry, EOMI, no sinus tenderness,  oropharynx pink and moist.  Neck supple no adenopathy.Nasal mucosa erythematous and edematous  Chest: Clear to auscultation bilaterally.  CVS: S1, S2 no murmurs, no S3.  ABD: Soft non tender. Bowel sounds normal.  Ext: No edema  MS: Adequate ROM spine, shoulders, hips and knees.  Skin: Intact, no ulcerations or rash noted.  Psych: Good eye contact, normal affect. Memory intact not anxious or depressed appearing.  CNS: CN 2-12 intact, power, tone and sensation normal throughout.       Assessment & Plan:

## 2012-10-16 ENCOUNTER — Telehealth: Payer: Self-pay | Admitting: Family Medicine

## 2012-10-16 ENCOUNTER — Other Ambulatory Visit: Payer: Self-pay

## 2012-10-16 MED ORDER — ALBUTEROL SULFATE HFA 108 (90 BASE) MCG/ACT IN AERS: 2.0000 | INHALATION_SPRAY | Freq: Four times a day (QID) | RESPIRATORY_TRACT | Status: DC | PRN

## 2012-10-16 NOTE — Telephone Encounter (Signed)
Albuterol inhaler refilled 

## 2012-10-17 ENCOUNTER — Telehealth: Payer: Self-pay | Admitting: Family Medicine

## 2012-10-18 ENCOUNTER — Other Ambulatory Visit: Payer: Self-pay | Admitting: Family Medicine

## 2012-10-18 NOTE — Telephone Encounter (Signed)
Med called in 11/14

## 2012-10-29 ENCOUNTER — Encounter: Payer: Self-pay | Admitting: *Deleted

## 2012-11-07 ENCOUNTER — Ambulatory Visit (INDEPENDENT_AMBULATORY_CARE_PROVIDER_SITE_OTHER): Payer: 59 | Admitting: Otolaryngology

## 2012-11-07 DIAGNOSIS — J343 Hypertrophy of nasal turbinates: Secondary | ICD-10-CM

## 2012-11-07 DIAGNOSIS — J330 Polyp of nasal cavity: Secondary | ICD-10-CM

## 2012-12-05 ENCOUNTER — Ambulatory Visit (INDEPENDENT_AMBULATORY_CARE_PROVIDER_SITE_OTHER): Payer: 59 | Admitting: Otolaryngology

## 2012-12-05 DIAGNOSIS — J342 Deviated nasal septum: Secondary | ICD-10-CM

## 2012-12-05 DIAGNOSIS — J330 Polyp of nasal cavity: Secondary | ICD-10-CM

## 2013-01-10 ENCOUNTER — Ambulatory Visit (INDEPENDENT_AMBULATORY_CARE_PROVIDER_SITE_OTHER): Payer: PRIVATE HEALTH INSURANCE | Admitting: Family Medicine

## 2013-01-10 ENCOUNTER — Encounter: Payer: Self-pay | Admitting: Family Medicine

## 2013-01-10 VITALS — BP 148/90 | HR 93 | Resp 18 | Ht 65.75 in | Wt 117.0 lb

## 2013-01-10 DIAGNOSIS — R7302 Impaired glucose tolerance (oral): Secondary | ICD-10-CM

## 2013-01-10 DIAGNOSIS — I10 Essential (primary) hypertension: Secondary | ICD-10-CM

## 2013-01-10 DIAGNOSIS — J45909 Unspecified asthma, uncomplicated: Secondary | ICD-10-CM

## 2013-01-10 DIAGNOSIS — L299 Pruritus, unspecified: Secondary | ICD-10-CM

## 2013-01-10 DIAGNOSIS — E785 Hyperlipidemia, unspecified: Secondary | ICD-10-CM

## 2013-01-10 DIAGNOSIS — K219 Gastro-esophageal reflux disease without esophagitis: Secondary | ICD-10-CM

## 2013-01-10 DIAGNOSIS — R7309 Other abnormal glucose: Secondary | ICD-10-CM

## 2013-01-10 DIAGNOSIS — E559 Vitamin D deficiency, unspecified: Secondary | ICD-10-CM

## 2013-01-10 LAB — CBC
HCT: 41.3 % (ref 36.0–46.0)
RBC: 4.76 MIL/uL (ref 3.87–5.11)
RDW: 14 % (ref 11.5–15.5)
WBC: 5.4 10*3/uL (ref 4.0–10.5)

## 2013-01-10 LAB — BASIC METABOLIC PANEL
BUN: 10 mg/dL (ref 6–23)
CO2: 27 mEq/L (ref 19–32)
Chloride: 103 mEq/L (ref 96–112)
Potassium: 4.1 mEq/L (ref 3.5–5.3)

## 2013-01-10 LAB — LIPID PANEL
Cholesterol: 182 mg/dL (ref 0–200)
HDL: 53 mg/dL (ref >39)
LDL Cholesterol: 118 mg/dL — ABNORMAL HIGH (ref 0–99)
Triglycerides: 57 mg/dL (ref <150)

## 2013-01-10 LAB — TSH: TSH: 1.975 u[IU]/mL (ref 0.350–4.500)

## 2013-01-10 MED ORDER — HYDROXYZINE HCL 25 MG PO TABS: ORAL_TABLET | ORAL | Status: DC

## 2013-01-10 MED ORDER — ESOMEPRAZOLE MAGNESIUM 40 MG PO CPDR: 40.0000 mg | DELAYED_RELEASE_CAPSULE | Freq: Every day | ORAL | Status: DC

## 2013-01-10 MED ORDER — PREDNISONE 5 MG PO TABS: 5.0000 mg | ORAL_TABLET | Freq: Two times a day (BID) | ORAL | Status: DC

## 2013-01-10 NOTE — Patient Instructions (Addendum)
F/u in 6 month, cal;l if you need me before please.  Labs today, cbc, chem 7, lipid, tsh, vit D and hBA1C, H pylori   You are referred to Dr Darrick Penna due to difficulty swallowing and uncontrolled reflux, also nexium is prescribed  Medication is prescribed for pruritic rash, prednisone and hydroxyzine   Please get OTC hydrocortisone cream to apply sparingly twice daily for 1 week to rash on buttock, is still a concern call for referral to dermatology.  Blood pressure uncontrolled, you need to take medication every day, as prescribed.  All chronic  meds will be refilled for 6 month

## 2013-01-10 NOTE — Assessment & Plan Note (Signed)
Hyperlipidemia:Low fat diet discussed and encouraged.   

## 2013-01-10 NOTE — Assessment & Plan Note (Signed)
3 week history, steroids and hydroxyzine

## 2013-01-10 NOTE — Progress Notes (Signed)
  Subjective:    Patient ID: Hannah Hays, female    DOB: May 01, 1966, 47 y.o.   MRN: [id number]  HPI  The PT is here for follow up and re-evaluation of chronic medical conditions, medication management and review of any available recent lab and radiology data.  Preventive health is updated, specifically  Cancer screening and Immunization.   Questions or concerns regarding consultations or procedures which the PT has had in the interim are  Addressed.Saw ENT in January for uncontrolled, sinus symptoms, surgery stillbeing considered The PT denies any adverse reactions to current medications since the last visit.  End January she has started having outbreak of red bumps which itch, she relates it to an OTC tab for skin and nails 6 month h/o progressive dysphagia for both liquids and solids Lesion on left buttock small spot black center, has increased, clear area, not itching, no drainage or redness    Review of Systems See HPI Denies recent fever or chills. Denies sinus pressure, nasal congestion, ear pain or sore throat. Denies chest congestion, productive cough or wheezing. Denies chest pains, palpitations and leg swelling Denies abdominal pain, nausea, vomiting,diarrhea or constipation.   Denies dysuria, frequency, hesitancy or incontinence. Denies joint pain, swelling and limitation in mobility. Denies headaches, seizures, numbness, or tingling. Denies depression, anxiety or insomnia.      Objective:   Physical Exam  Patient alert and oriented and in no cardiopulmonary distress.  HEENT: No facial asymmetry, EOMI, no sinus tenderness,  oropharynx pink and moist.  Neck supple no adenopathy.  Chest: Clear to auscultation bilaterally.  CVS: S1, S2 no murmurs, no S3.  ABD: Soft non tender. Bowel sounds normal.  Ext: No edema  MS: Adequate ROM spine, shoulders, hips and knees.  Skin: Intact, erythematous lesions on arms and lower extremity , flat, also hypopigmented annular  lesion on left buttock, max diameter approx 1.5 cm Psych: Good eye contact, normal affect. Memory intact not anxious or depressed appearing.  CNS: CN 2-12 intact, power, tone and sensation normal throughout.      Assessment & Plan:

## 2013-01-10 NOTE — Assessment & Plan Note (Signed)
Deteriorated with dysphagia, start nexium and GI  eval

## 2013-01-10 NOTE — Assessment & Plan Note (Signed)
Controlled, no change in medication  

## 2013-01-10 NOTE — Assessment & Plan Note (Signed)
Uncontrolled due to non compliance DASH diet and commitment to daily physical activity for a minimum of 30 minutes discussed and encouraged, as a part of hypertension management. The importance of attaining a healthy weight is also discussed.

## 2013-01-10 NOTE — Assessment & Plan Note (Signed)
Recheck level 

## 2013-01-11 LAB — VITAMIN D 25 HYDROXY (VIT D DEFICIENCY, FRACTURES): Vit D, 25-Hydroxy: 27 ng/mL — ABNORMAL LOW (ref 30–89)

## 2013-01-13 ENCOUNTER — Ambulatory Visit: Payer: 59 | Admitting: Family Medicine

## 2013-01-13 LAB — H. PYLORI ANTIBODY, IGG: H Pylori IgG: 0.54 {ISR}

## 2013-01-29 ENCOUNTER — Other Ambulatory Visit: Payer: Self-pay | Admitting: Family Medicine

## 2013-01-29 DIAGNOSIS — Z139 Encounter for screening, unspecified: Secondary | ICD-10-CM

## 2013-01-30 ENCOUNTER — Encounter: Payer: Self-pay | Admitting: Gastroenterology

## 2013-01-31 ENCOUNTER — Ambulatory Visit (HOSPITAL_COMMUNITY)
Admission: RE | Admit: 2013-01-31 | Discharge: 2013-01-31 | Disposition: A | Discharge status: Home / Self Care | Payer: 59 | Source: Ambulatory Visit | Attending: Family Medicine | Admitting: Family Medicine

## 2013-01-31 DIAGNOSIS — Z1231 Encounter for screening mammogram for malignant neoplasm of breast: Secondary | ICD-10-CM | POA: Insufficient documentation

## 2013-01-31 DIAGNOSIS — Z139 Encounter for screening, unspecified: Secondary | ICD-10-CM

## 2013-02-03 ENCOUNTER — Encounter: Payer: Self-pay | Admitting: Gastroenterology

## 2013-02-03 ENCOUNTER — Ambulatory Visit (INDEPENDENT_AMBULATORY_CARE_PROVIDER_SITE_OTHER): Payer: 59 | Admitting: Gastroenterology

## 2013-02-03 ENCOUNTER — Other Ambulatory Visit: Payer: Self-pay | Admitting: Gastroenterology

## 2013-02-03 VITALS — BP 126/69 | HR 63 | Temp 98.4°F | Ht 66.0 in | Wt 119.0 lb

## 2013-02-03 DIAGNOSIS — R1314 Dysphagia, pharyngoesophageal phase: Secondary | ICD-10-CM

## 2013-02-03 DIAGNOSIS — K219 Gastro-esophageal reflux disease without esophagitis: Secondary | ICD-10-CM

## 2013-02-03 DIAGNOSIS — K589 Irritable bowel syndrome without diarrhea: Secondary | ICD-10-CM

## 2013-02-03 NOTE — Assessment & Plan Note (Signed)
47 year old female with history of GERD. New-onset dysphagia noted with some improvement with Nexium although not completely resolved. Notes solid food and liquid dysphagia. No weight loss or lack of appetite. Her last EGD was in 2009 with H.pylori gastritis, s/p Prevpac treatment. Likely her symptoms are due to GERD, yet differentials include esophageal web, ring, or stricture. Low likelihood of malignancy.   Continue Nexium as prescribed. Proceed with upper endoscopy and dilation in the near future with Dr. Darrick Penna. The risks, benefits, and alternatives have been discussed in detail with patient. They have stated understanding and desire to proceed.

## 2013-02-03 NOTE — Assessment & Plan Note (Signed)
With history of chronic diarrhea. Last TCS in 2008 by Dr. Darrick Penna. Simple adenoma noted, negative microscopic colitis. She is at baseline. Due for surveillance 2018.

## 2013-02-03 NOTE — Progress Notes (Signed)
Referring Hannah Hays: Kerri Perches, MD Primary Care Physician:  Syliva Overman, MD Primary Gastroenterologist:  Dr. Darrick Penna   Chief Complaint  Patient presents with  . Dysphagia    HPI:   Hannah Hays is a very pleasant 47 year old female who presents today at the request of Dr. Lodema Hong secondary to dysphagia. She has a history of H.pylori gastritis, s/p Prevpac treatment in 2009. Last colonoscopy by Dr. Darrick Penna in Dec 2008 due to non-bloody diarrhea; biopsies negative for microscopic colitis, and she will be due for surveillance 2018 due to history of simple adenoma.   Presents today stating she was placed on Nexium early February by Dr. Lodema Hong. Prior to that, no PPI. Pt states prior to being placed on Nexium, would note dysphagia, points to mid-esophagus. Not associated just with tough textures. Noted solid food and liquids causing issues. States would wake up at night feeling like she was having heart attack. Since starting Nexium notes some improvement. Denies nocturnal reflux since Nexium started. Intermittent dysphagia but not as often. No N/V. Denies lack of appetite. Denies early satiety. History of chronic, intermittent loose stools. Sometimes will be hard. Taking fiber, calcium. States slightly better symptoms than when she had the colonoscopy. No rectal bleeding.   States sometimes will feel a "knot" in RUQ. States chronic, for 12-13 years. Worsened with movement. Noted as a "nagging" feeling. Not associated with bowel habits. At baseline.   Past Medical History  Diagnosis Date  . Asthma   . Allergic rhinitis, seasonal   . Migraine headache 17    recurrence 1st time in 10 year in 2011  . IBS (irritable bowel syndrome)     pre-dominant diarrhea  . History of colonoscopy 2008    with simple adenoma  . Helicobacter pylori gastritis 2009    s/p Prevpac treatment  . GERD (gastroesophageal reflux disease)     Past Surgical History  Procedure Laterality Date  . Partial  hysterectomy  2002  . Tubal ligation  1995  . Cholecystectomy    . Colonoscopy  11/13/2007    SLF:A 4 mm sessile transverse colon polyp/Random biopsies obtained throughout the colon/Otherwise normal. Negative for microscopic colitis, simple adenoma, due for surveillance 11/2017 per [facility]  . Esophagogastroduodenoscopy  03/20/2008    SLF:A 1- to 2-cm hiatal hernia, mild erythema in the antrum/Normal esophagus without evidence of Barrett's, +H.pylori s/p Prevapc treatment     Current Outpatient Prescriptions  Medication Sig Dispense Refill  . albuterol (PROVENTIL HFA) 108 (90 BASE) MCG/ACT inhaler Inhale 2 puffs into the lungs every 6 (six) hours as needed.  8.5 g  3  . amLODipine (NORVASC) 5 MG tablet TAKE ONE TABLET BY MOUTH EVERY DAY  30 tablet  1  . esomeprazole (NEXIUM) 40 MG capsule Take 1 capsule (40 mg total) by mouth daily.  30 capsule  1  . fluticasone (VERAMYST) 27.5 MCG/SPRAY nasal spray 2 sprays by Nasal route daily.        . hydrOXYzine (ATARAX/VISTARIL) 25 MG tablet One to two tablets at bedtime , as needed, for severe itching  30 tablet  0  . ibuprofen (ADVIL,MOTRIN) 800 MG tablet One tablet once daily, as needed for svere headache , maximum 4 tablets per week  30 tablet  1  . mometasone-formoterol (DULERA) 100-5 MCG/ACT AERO Inhale 2 puffs into the lungs 2 (two) times daily.  8.8 g  1  . montelukast (SINGULAIR) 10 MG tablet Take 10 mg by mouth at bedtime.        Marland Kitchen  predniSONE (DELTASONE) 5 MG tablet Take 1 tablet (5 mg total) by mouth 2 (two) times daily.  10 tablet  0  . topiramate (TOPAMAX) 50 MG tablet Take 1 tablet (50 mg total) by mouth 2 (two) times daily.  69 tablet  5   No current facility-administered medications for this visit.    Allergies as of 02/03/2013 - Review Complete 02/03/2013  Allergen Reaction Noted  . Pro-flora concentrate  02/08/2011    Family History  Problem Relation Age of Onset  . Hypertension Mother   . Heart disease Mother   . Diabetes Mother    . Alcohol abuse Father   . Hypertension Father   . Hypertension Sister   . Kidney disease Sister   . Diabetes Sister   . Hypertension Sister   . Diabetes Sister   . Diabetes Brother   . Heart disease Brother   . Hypertension Brother   . Colon cancer Neg Hx     History   Social History  . Marital Status: Married    Spouse Name: N/A    Number of Children: 2  . Years of Education: N/A   Occupational History  . 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     Social History Main Topics  . Smoking status: Never Smoker   . Smokeless tobacco: Not on file  . Alcohol Use: No  . Drug Use: No  . Sexually Active: Not on file   Other Topics Concern  . Not on file   Social History Narrative  . No narrative on file    Review of Systems: Gen: Denies any fever, chills, loss of appetite, fatigue, weight loss. CV: Denies chest pain, heart palpitations, syncope, peripheral edema. Resp: Denies shortness of breath with rest, cough, wheezing GI: SEE HPI GU : Denies urinary burning, urinary frequency, urinary incontinence.  MS: +joint pain Derm: Denies rash, itching, dry skin Psych: Denies depression, anxiety, confusion or memory loss  Heme: Denies bruising, bleeding, and enlarged lymph nodes.  Physical Exam: BP 126/69  Pulse 63  Temp(Src) 98.4 F (36.9 C) (Oral)  Ht 5\' 6"  (1.676 m)  Wt 119 lb (53.978 kg)  BMI 19.22 kg/m2 General:   Alert and oriented. Well-developed, well-nourished, pleasant and cooperative. Head:  Normocephalic and atraumatic. Eyes:  Conjunctiva pink, sclera clear, no icterus.   Conjunctiva pink. Ears:  Normal auditory acuity. Nose:  No deformity, discharge,  or lesions. Mouth:  No deformity or lesions, mucosa pink and moist.  Neck:  Supple, without mass or thyromegaly. Lungs:  Clear to auscultation bilaterally, without wheezing, rales, or rhonchi.  Heart:  S1, S2 present without murmurs noted.  Abdomen:  +BS, soft, non-tender and  non-distended. Without mass or HSM. No rebound or guarding. No hernias noted. Rectal:  Deferred  Msk:  Symmetrical without gross deformities. Normal posture. Extremities:  Without clubbing or edema. Neurologic:  Alert and  oriented x4;  grossly normal neurologically. Skin:  Intact, warm and dry without significant lesions or rashes Cervical Nodes:  No significant cervical adenopathy. Psych:  Alert and cooperative. Normal mood and affect.

## 2013-02-03 NOTE — Patient Instructions (Addendum)
Continue taking Nexium each morning, 30 minutes before breakfast.  We have scheduled you for an upper endoscopy with dilation with Dr. Darrick Penna in the near future.  Please review the reflux diet.   Be careful in the icy weather!  Diet for Gastroesophageal Reflux Disease, Adult Reflux (acid reflux) is when acid from your stomach flows up into the esophagus. When acid comes in contact with the esophagus, the acid causes irritation and soreness (inflammation) in the esophagus. When reflux happens often or so severely that it causes damage to the esophagus, it is called gastroesophageal reflux disease (GERD). Nutrition therapy can help ease the discomfort of GERD. FOODS OR DRINKS TO AVOID OR LIMIT  Smoking or chewing tobacco. Nicotine is one of the most potent stimulants to acid production in the gastrointestinal tract.  Caffeinated and decaffeinated coffee and black tea.  Regular or low-calorie carbonated beverages or energy drinks (caffeine-free carbonated beverages are allowed).   Strong spices, such as black pepper, white pepper, red pepper, cayenne, curry powder, and chili powder.  Peppermint or spearmint.  Chocolate.  High-fat foods, including meats and fried foods. Extra added fats including oils, butter, salad dressings, and nuts. Limit these to less than 8 tsp per day.  Fruits and vegetables if they are not tolerated, such as citrus fruits or tomatoes.  Alcohol.  Any food that seems to aggravate your condition. If you have questions regarding your diet, call your caregiver or a registered dietitian. OTHER THINGS THAT MAY HELP GERD INCLUDE:   Eating your meals slowly, in a relaxed setting.  Eating 5 to 6 small meals per day instead of 3 large meals.  Eliminating food for a period of time if it causes distress.  Not lying down until 3 hours after eating a meal.  Keeping the head of your bed raised 6 to 9 inches (15 to 23 cm) by using a foam wedge or blocks under the legs  of the bed. Lying flat may make symptoms worse.  Being physically active. Weight loss may be helpful in reducing reflux in overweight or obese adults.  Wear loose fitting clothing EXAMPLE MEAL PLAN This meal plan is approximately 2,000 calories based on https://www.bern[facility].org/ meal planning guidelines. Breakfast   cup cooked oatmeal.  1 cup strawberries.  1 cup low-fat milk.  1 oz almonds. Snack  1 cup cucumber slices.  6 oz yogurt (made from low-fat or fat-free milk). Lunch  2 slice whole-wheat bread.  2 oz sliced [place].  2 tsp mayonnaise.  1 cup blueberries.  1 cup snap peas. Snack  6 whole-wheat crackers.  1 oz string cheese. Dinner   cup brown rice.  1 cup mixed veggies.  1 tsp olive oil.  3 oz grilled fish. Document Released: 11/20/2005 Document Revised: 02/12/2012 Document Reviewed: 10/06/2011 [facility] Patient Information 2013 [place], [place].

## 2013-02-04 ENCOUNTER — Encounter (HOSPITAL_COMMUNITY): Payer: Self-pay | Admitting: Pharmacy Technician

## 2013-02-05 NOTE — Progress Notes (Signed)
Faxed to PCP

## 2013-02-14 ENCOUNTER — Encounter (HOSPITAL_COMMUNITY)
Admission: RE | Disposition: A | Discharge status: Home / Self Care | Payer: Self-pay | Source: Ambulatory Visit | Attending: Gastroenterology

## 2013-02-14 ENCOUNTER — Ambulatory Visit (HOSPITAL_COMMUNITY)
Admission: RE | Admit: 2013-02-14 | Discharge: 2013-02-14 | Disposition: A | Discharge status: Home / Self Care | Payer: 59 | Source: Ambulatory Visit | Attending: Gastroenterology | Admitting: Gastroenterology

## 2013-02-14 ENCOUNTER — Encounter (HOSPITAL_COMMUNITY): Payer: Self-pay | Admitting: *Deleted

## 2013-02-14 DIAGNOSIS — K2970 Gastritis, unspecified, without bleeding: Secondary | ICD-10-CM

## 2013-02-14 DIAGNOSIS — K2990 Gastroduodenitis, unspecified, without bleeding: Secondary | ICD-10-CM

## 2013-02-14 DIAGNOSIS — Q391 Atresia of esophagus with tracheo-esophageal fistula: Secondary | ICD-10-CM

## 2013-02-14 DIAGNOSIS — R1314 Dysphagia, pharyngoesophageal phase: Secondary | ICD-10-CM

## 2013-02-14 DIAGNOSIS — I10 Essential (primary) hypertension: Secondary | ICD-10-CM | POA: Insufficient documentation

## 2013-02-14 DIAGNOSIS — R1310 Dysphagia, unspecified: Secondary | ICD-10-CM

## 2013-02-14 DIAGNOSIS — Q393 Congenital stenosis and stricture of esophagus: Secondary | ICD-10-CM

## 2013-02-14 HISTORY — PX: ESOPHAGOGASTRODUODENOSCOPY (EGD) WITH ESOPHAGEAL DILATION: SHX5812

## 2013-02-14 HISTORY — DX: Essential (primary) hypertension: I10

## 2013-02-14 SURGERY — ESOPHAGOGASTRODUODENOSCOPY (EGD) WITH ESOPHAGEAL DILATION
Anesthesia: Moderate Sedation

## 2013-02-14 MED ORDER — SODIUM CHLORIDE 0.9 % IV SOLN
INTRAVENOUS | Status: DC
  Administered 2013-02-14: 10:00:00 via INTRAVENOUS

## 2013-02-14 MED ORDER — MINERAL OIL PO OIL
TOPICAL_OIL | ORAL | Status: AC
  Filled 2013-02-14: qty 30

## 2013-02-14 MED ORDER — SODIUM CHLORIDE 0.45 % IV SOLN: INTRAVENOUS | Status: DC

## 2013-02-14 MED ORDER — MIDAZOLAM HCL 5 MG/5ML IJ SOLN
INTRAMUSCULAR | Status: DC | PRN
  Administered 2013-02-14 (×2): 2 mg via INTRAVENOUS

## 2013-02-14 MED ORDER — BUTAMBEN-TETRACAINE-BENZOCAINE 2-2-14 % EX AERO
INHALATION_SPRAY | CUTANEOUS | Status: DC | PRN
  Administered 2013-02-14: 2 via TOPICAL

## 2013-02-14 MED ORDER — MEPERIDINE HCL 100 MG/ML IJ SOLN
INTRAMUSCULAR | Status: DC | PRN
  Administered 2013-02-14 (×2): 25 mg via INTRAVENOUS

## 2013-02-14 MED ORDER — MIDAZOLAM HCL 5 MG/5ML IJ SOLN
INTRAMUSCULAR | Status: AC
  Filled 2013-02-14: qty 10

## 2013-02-14 MED ORDER — MEPERIDINE HCL 100 MG/ML IJ SOLN
INTRAMUSCULAR | Status: AC
  Filled 2013-02-14: qty 2

## 2013-02-14 MED ORDER — STERILE WATER FOR IRRIGATION IR SOLN
Status: DC | PRN
  Administered 2013-02-14: 10:00:00

## 2013-02-14 NOTE — H&P (Signed)
Primary Care Physician:  Syliva Overman, MD Primary Gastroenterologist:  Dr. Darrick Penna  Pre-Procedure History & Physical: HPI:  Hannah Hays is a 47 y.o. female here for DYSPHAGIA.  Past Medical History  Diagnosis Date  . Asthma   . Allergic rhinitis, seasonal   . Migraine headache 18    recurrence 1st time in 10 year in 2011  . IBS (irritable bowel syndrome)     pre-dominant diarrhea  . History of colonoscopy 2008    with simple adenoma  . Helicobacter pylori gastritis 2009    s/p Prevpac treatment  . GERD (gastroesophageal reflux disease)   . Hypertension     Past Surgical History  Procedure Laterality Date  . Partial hysterectomy  2002  . Tubal ligation  1995  . Cholecystectomy    . Colonoscopy  11/13/2007    SLF:A 4 mm sessile transverse colon polyp/Random biopsies obtained throughout the colon/Otherwise normal. Negative for microscopic colitis, simple adenoma, due for surveillance 11/2017 per [facility]  . Esophagogastroduodenoscopy  03/20/2008    SLF:A 1- to 2-cm hiatal hernia, mild erythema in the antrum/Normal esophagus without evidence of Barrett's, +H.pylori s/p Prevapc treatment     Prior to Admission medications   Medication Sig Start Date End Date Taking? Authorizing Oziel Beitler  albuterol (PROVENTIL HFA;VENTOLIN HFA) 108 (90 BASE) MCG/ACT inhaler Inhale 2 puffs into the lungs every 6 (six) hours as needed for wheezing or shortness of breath. 10/16/12  Yes Kerri Perches, MD  amLODipine (NORVASC) 5 MG tablet Take 5 mg by mouth daily.   Yes Historical Corianna Avallone, MD  Calcium Carbonate-Vitamin D (CALCIUM 600 + D PO) Take 2 tablets by mouth daily.   Yes Historical Sequoyah Counterman, MD  esomeprazole (NEXIUM) 40 MG capsule Take 1 capsule (40 mg total) by mouth daily. 01/10/13 01/10/14 Yes Kerri Perches, MD  fluticasone (VERAMYST) 27.5 MCG/SPRAY nasal spray Place 2 sprays into the nose at bedtime.    Yes Historical Shavonta Gossen, MD  mometasone-formoterol (DULERA) 200-5 MCG/ACT AERO  Inhale 2 puffs into the lungs 2 (two) times daily.   Yes Historical Khyle Goodell, MD  montelukast (SINGULAIR) 10 MG tablet Take 10 mg by mouth at bedtime.     Yes Historical Dynasty Holquin, MD  Multiple Vitamin (MULTIVITAMIN WITH MINERALS) TABS Take 1 tablet by mouth daily.   Yes Historical Emilea Goga, MD  ibuprofen (ADVIL,MOTRIN) 800 MG tablet Take 800 mg by mouth every 8 (eight) hours as needed for pain. 03/12/12   Kerri Perches, MD    Allergies as of 02/03/2013 - Review Complete 02/03/2013  Allergen Reaction Noted  . Pro-flora concentrate  02/08/2011    Family History  Problem Relation Age of Onset  . Hypertension Mother   . Heart disease Mother   . Diabetes Mother   . Alcohol abuse Father   . Hypertension Father   . Hypertension Sister   . Kidney disease Sister   . Diabetes Sister   . Hypertension Sister   . Diabetes Sister   . Diabetes Brother   . Heart disease Brother   . Hypertension Brother   . Colon cancer Neg Hx     History   Social History  . Marital Status: Married    Spouse Name: N/A    Number of Children: 2  . Years of Education: N/A   Occupational History  . 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     Social History Main Topics  . Smoking status: Never Smoker   .  Smokeless tobacco: Not on file  . Alcohol Use: No  . Drug Use: No  . Sexually Active: Not on file   Other Topics Concern  . Not on file   Social History Narrative  . No narrative on file    Review of Systems: See HPI, otherwise negative ROS   Physical Exam: BP 170/75  Pulse 60  Temp(Src) 97.6 F (36.4 C) (Oral)  Resp 18  Ht 5\' 6"  (1.676 m)  Wt 119 lb (53.978 kg)  BMI 19.22 kg/m2  SpO2 94% General:   Alert,  pleasant and cooperative in NAD Head:  Normocephalic and atraumatic. Neck:  Supple; Lungs:  Clear throughout to auscultation.    Heart:  Regular rate and rhythm. Abdomen:  Soft, nontender and nondistended. Normal bowel sounds, without guarding, and  without rebound.   Neurologic:  Alert and  oriented x4;  grossly normal neurologically.  Impression/Plan:     DYSPHAGIA  PLAN:  EGD/DIL TODAY

## 2013-02-14 NOTE — Op Note (Signed)
[facility] [address] [place], [address]   ENDOSCOPY PROCEDURE REPORT  PATIENT: Hannah, Hays  MR#: #[id number] BIRTHDATE: 14-May-1966 , 46  yrs. old GENDER: Female  ENDOSCOPIST: Jonette Eva, MD REFFERED AO:[id number] Lodema Hong, M.D.  PROCEDURE DATE:  02/14/2013 PROCEDURE:   EGD with biopsy and EGD with dilatation over guidewire   INDICATIONS:1.  dysphagia. MEDICATIONS: Demerol 50 mg IV and Versed 4 mg IV TOPICAL ANESTHETIC: Cetacaine Spray  DESCRIPTION OF PROCEDURE:   After the risks benefits and alternatives of the procedure were thoroughly explained, informed consent was obtained.  The EG-[id number] ([id number])  endoscope was introduced through the mouth and advanced to the second portion of the duodenum. The instrument was slowly withdrawn as the mucosa was carefully examined.  Prior to withdrawal of the scope, the guidwire was placed.  The esophagus was dilated successfully.  The patient was recovered in endoscopy and discharged home in satisfactory condition.   ESOPHAGUS: GE JUNCTION 40 CM FROM THE TEETH.  COLD FORCEPS BIOPSIES OBTAINED 20 CM AND 35 CM FROM THE TEETH TO EVALUATE FOR EOSINOPHILIC ESOPHAGITIS.   An esophageal web was found in the lower third of the esophagus. STOMACH: Mild non-erosive gastritis (inflammation) was found in the gastric antrum.  Multiple biopsies were performed using cold forceps.   DUODENUM: The duodenal mucosa showed no abnormalities in the bulb and second portion of the duodenum.   Dilation was then performed at the distal esophagus  Dilator: Savary over guidewire Size(s): 12.8-16 MM Resistance: minimal TO MODERATE Heme: yes  COMPLICATIONS: There were no complications.  ENDOSCOPIC IMPRESSION: 1.   DISTAL Esophageal web 2.   MILD Non-erosive gastritis (inflammation)  RECOMMENDATIONS: CONTINUE NEXIUM.  TAKE 30 MINUTES PRIOR TO BREAKFAST. FOLLOW A LOW FAT DIET. BIOPSY WILL BE BACK IN 7 DAYS FOLLOW UP APPT IN 2 MONTHS.   CONSIDER REPEAT EGD/DIL IF DYSPHAGIA CONTINUES.      _______________________________ Rosalie DoctorJonette Eva, MD 02/14/2013 12:18 PM      PATIENT NAME:  Hannah, Hays MR#: #[id number]

## 2013-02-19 ENCOUNTER — Encounter (HOSPITAL_COMMUNITY): Payer: Self-pay | Admitting: Gastroenterology

## 2013-02-24 ENCOUNTER — Telehealth: Payer: Self-pay | Admitting: *Deleted

## 2013-02-24 NOTE — Telephone Encounter (Signed)
Hannah Hays called today to get the results from her recent procedure. Please call her when they are available. Thank you.

## 2013-02-24 NOTE — Telephone Encounter (Signed)
Routing to Dr. Fields.  

## 2013-02-25 ENCOUNTER — Telehealth: Payer: Self-pay | Admitting: Gastroenterology

## 2013-02-25 NOTE — Telephone Encounter (Signed)
LMOM to call.

## 2013-02-25 NOTE — Telephone Encounter (Signed)
Pt returned call and was informed of results.  

## 2013-02-25 NOTE — Telephone Encounter (Signed)
Path faxed to PCP 

## 2013-02-25 NOTE — Telephone Encounter (Signed)
Pt is aware of OV °

## 2013-02-25 NOTE — Telephone Encounter (Signed)
Please call pt. HER stomach Bx shows gastritis FROM IBUPROFEN. HER ESOPHAGUS BIOPSIES ARE NORMAL. SHE MOST LIKELY HAD PROBLEMS SWALLOWING DUE TO A STRICTURE IN HER LOWER ESOPHAGUS BECAUSE HER HEARTBURN IS NOT IDEALLY CONTROLLED.   CONTINUE NEXIUM FOREVER. TAKE 30 MINUTES PRIOR TO BREAKFAST.  FOLLOW A LOW FAT DIET.   FOLLOW UP APPT IN 2 MONTHS E15 DYSPHAGIA.

## 2013-03-07 ENCOUNTER — Other Ambulatory Visit: Payer: Self-pay

## 2013-03-07 ENCOUNTER — Telehealth: Payer: Self-pay | Admitting: Family Medicine

## 2013-03-07 MED ORDER — ALBUTEROL SULFATE HFA 108 (90 BASE) MCG/ACT IN AERS: 2.0000 | INHALATION_SPRAY | Freq: Four times a day (QID) | RESPIRATORY_TRACT | Status: DC | PRN

## 2013-03-07 NOTE — Telephone Encounter (Signed)
Med refilled.

## 2013-03-10 ENCOUNTER — Encounter (HOSPITAL_COMMUNITY): Payer: Self-pay | Admitting: *Deleted

## 2013-03-10 ENCOUNTER — Emergency Department (HOSPITAL_COMMUNITY)
Admission: EM | Admit: 2013-03-10 | Discharge: 2013-03-10 | Disposition: A | Discharge status: Home / Self Care | Payer: 59 | Attending: Emergency Medicine | Admitting: Emergency Medicine

## 2013-03-10 DIAGNOSIS — Z8719 Personal history of other diseases of the digestive system: Secondary | ICD-10-CM | POA: Insufficient documentation

## 2013-03-10 DIAGNOSIS — I10 Essential (primary) hypertension: Secondary | ICD-10-CM | POA: Insufficient documentation

## 2013-03-10 DIAGNOSIS — J309 Allergic rhinitis, unspecified: Secondary | ICD-10-CM | POA: Insufficient documentation

## 2013-03-10 DIAGNOSIS — J45909 Unspecified asthma, uncomplicated: Secondary | ICD-10-CM | POA: Insufficient documentation

## 2013-03-10 DIAGNOSIS — Z9889 Other specified postprocedural states: Secondary | ICD-10-CM | POA: Insufficient documentation

## 2013-03-10 DIAGNOSIS — R059 Cough, unspecified: Secondary | ICD-10-CM | POA: Insufficient documentation

## 2013-03-10 DIAGNOSIS — J45901 Unspecified asthma with (acute) exacerbation: Secondary | ICD-10-CM

## 2013-03-10 DIAGNOSIS — R0602 Shortness of breath: Secondary | ICD-10-CM | POA: Insufficient documentation

## 2013-03-10 DIAGNOSIS — R062 Wheezing: Secondary | ICD-10-CM | POA: Insufficient documentation

## 2013-03-10 DIAGNOSIS — Z79899 Other long term (current) drug therapy: Secondary | ICD-10-CM | POA: Insufficient documentation

## 2013-03-10 DIAGNOSIS — Z8679 Personal history of other diseases of the circulatory system: Secondary | ICD-10-CM | POA: Insufficient documentation

## 2013-03-10 DIAGNOSIS — R05 Cough: Secondary | ICD-10-CM | POA: Insufficient documentation

## 2013-03-10 LAB — CBC WITH DIFFERENTIAL/PLATELET
Eosinophils Absolute: 0.8 10*3/uL — ABNORMAL HIGH (ref 0.0–0.7)
Eosinophils Relative: 15 % — ABNORMAL HIGH (ref 0–5)
Hemoglobin: 13.9 g/dL (ref 12.0–15.0)
Lymphocytes Relative: 25 % (ref 12–46)
Lymphs Abs: 1.4 10*3/uL (ref 0.7–4.0)
MCH: 31.1 pg (ref 26.0–34.0)
MCV: 89.7 fL (ref 78.0–100.0)
Monocytes Relative: 6 % (ref 3–12)
Neutrophils Relative %: 54 % (ref 43–77)
RBC: 4.47 MIL/uL (ref 3.87–5.11)

## 2013-03-10 LAB — BASIC METABOLIC PANEL
BUN: 8 mg/dL (ref 6–23)
CO2: 24 mEq/L (ref 19–32)
GFR calc non Af Amer: >90 mL/min (ref >90)
Glucose, Bld: 116 mg/dL — ABNORMAL HIGH (ref 70–99)
Potassium: 3 mEq/L — ABNORMAL LOW (ref 3.5–5.1)

## 2013-03-10 MED ORDER — ALBUTEROL SULFATE HFA 108 (90 BASE) MCG/ACT IN AERS
2.0000 | INHALATION_SPRAY | Freq: Once | RESPIRATORY_TRACT | Status: AC
  Administered 2013-03-10: 2 via RESPIRATORY_TRACT
  Filled 2013-03-10: qty 6.7

## 2013-03-10 MED ORDER — MAGNESIUM SULFATE 40 MG/ML IJ SOLN
4.0000 g | Freq: Once | INTRAMUSCULAR | Status: AC
  Administered 2013-03-10: 2 g via INTRAVENOUS
  Filled 2013-03-10: qty 50

## 2013-03-10 MED ORDER — ALBUTEROL (5 MG/ML) CONTINUOUS INHALATION SOLN
20.0000 mg | INHALATION_SOLUTION | Freq: Once | RESPIRATORY_TRACT | Status: AC
  Administered 2013-03-10: 20 mg via RESPIRATORY_TRACT
  Filled 2013-03-10: qty 20

## 2013-03-10 MED ORDER — ALBUTEROL SULFATE (5 MG/ML) 0.5% IN NEBU: 5.0000 mg | INHALATION_SOLUTION | Freq: Once | RESPIRATORY_TRACT | Status: AC

## 2013-03-10 MED ORDER — ALBUTEROL SULFATE (2.5 MG/3ML) 0.083% IN NEBU: 2.5000 mg | INHALATION_SOLUTION | RESPIRATORY_TRACT | Status: DC | PRN

## 2013-03-10 MED ORDER — POTASSIUM CHLORIDE CRYS ER 20 MEQ PO TBCR
40.0000 meq | EXTENDED_RELEASE_TABLET | Freq: Once | ORAL | Status: AC
  Administered 2013-03-10: 40 meq via ORAL
  Filled 2013-03-10: qty 2

## 2013-03-10 MED ORDER — PREDNISONE 50 MG PO TABS
60.0000 mg | ORAL_TABLET | Freq: Once | ORAL | Status: AC
  Administered 2013-03-10: 60 mg via ORAL
  Filled 2013-03-10: qty 1

## 2013-03-10 MED ORDER — ALBUTEROL SULFATE (5 MG/ML) 0.5% IN NEBU
INHALATION_SOLUTION | RESPIRATORY_TRACT | Status: AC
  Administered 2013-03-10: 5 mg via RESPIRATORY_TRACT
  Filled 2013-03-10: qty 1

## 2013-03-10 MED ORDER — IPRATROPIUM BROMIDE 0.02 % IN SOLN: 0.5000 mg | Freq: Once | RESPIRATORY_TRACT | Status: AC

## 2013-03-10 MED ORDER — IPRATROPIUM BROMIDE 0.02 % IN SOLN
RESPIRATORY_TRACT | Status: AC
  Administered 2013-03-10: 0.5 mg via RESPIRATORY_TRACT
  Filled 2013-03-10: qty 2.5

## 2013-03-10 MED ORDER — PREDNISONE 20 MG PO TABS: ORAL_TABLET | ORAL | Status: DC

## 2013-03-10 NOTE — ED Notes (Signed)
Asthma attack starting approx 1.5 hrs pta.  Pt wheezing upon arrival.  Unable to speak.

## 2013-03-10 NOTE — ED Notes (Signed)
Pt only received 2grams of mag, edp d/c 2nd order of 2grams iv. Read back and verified.

## 2013-03-10 NOTE — ED Provider Notes (Signed)
History  This chart was scribed for Joya Gaskins, MD by Bennett Scrape, ED Scribe. This patient was seen in room [facility]06/[facility]06 and the patient's care was started at 4:37 PM.  CSN: [id number]  Arrival date & time 03/10/13  1606   First MD Initiated Contact with Patient 03/10/13 1637      Chief Complaint  Patient presents with  . Asthma     Patient is a 47 y.o. female presenting with shortness of breath. The history is provided by the patient. No language interpreter was used.  Shortness of Breath Severity:  Moderate Onset quality:  Gradual Duration:  2 weeks Timing:  Intermittent Progression:  Worsening Relieved by:  Nothing Worsened by:  Exertion Ineffective treatments:  Inhaler Associated symptoms: cough and wheezing   Associated symptoms: no fever and no vomiting     Hannah Hays is a 47 y.o. female with a h/o asthma who presents to the Emergency Department complaining of 2 weeks of intermittent SOB with associated wheezing, dry cough and chest tightness that became worse 1.5 hours ago. She reports trying her home nebulizer and inhaler with no relief. She denies any prior ICU admissions or prior intubations. She denies hemoptysis, back pain, fever, chills, emesis and diarrhea as associated symptoms. She also has a h/o GERD and HTN. She denies smoking and alcohol use.  Past Medical History  Diagnosis Date  . Asthma   . Allergic rhinitis, seasonal   . Migraine headache 18    recurrence 1st time in 10 year in 2011  . IBS (irritable bowel syndrome)     pre-dominant diarrhea  . History of colonoscopy 2008    with simple adenoma  . Helicobacter pylori gastritis 2009    s/p Prevpac treatment  . GERD (gastroesophageal reflux disease)   . Hypertension     Past Surgical History  Procedure Laterality Date  . Partial hysterectomy  2002  . Tubal ligation  1995  . Cholecystectomy    . Colonoscopy  11/13/2007    SLF:A 4 mm sessile transverse colon polyp/Random biopsies  obtained throughout the colon/Otherwise normal. Negative for microscopic colitis, simple adenoma, due for surveillance 11/2017 per [facility]  . Esophagogastroduodenoscopy  03/20/2008    SLF:A 1- to 2-cm hiatal hernia, mild erythema in the antrum/Normal esophagus without evidence of Barrett's, +H.pylori s/p Prevapc treatment   . Esophagogastroduodenoscopy (egd) with esophageal dilation N/A 02/14/2013    Procedure: ESOPHAGOGASTRODUODENOSCOPY (EGD) WITH ESOPHAGEAL DILATION;  Surgeon: West Bali, MD;  Location: [facility] SUITE;  Service: Endoscopy;  Laterality: N/A;  10:15    Family History  Problem Relation Age of Onset  . Hypertension Mother   . Heart disease Mother   . Diabetes Mother   . Alcohol abuse Father   . Hypertension Father   . Hypertension Sister   . Kidney disease Sister   . Diabetes Sister   . Hypertension Sister   . Diabetes Sister   . Diabetes Brother   . Heart disease Brother   . Hypertension Brother   . Colon cancer Neg Hx     History  Substance Use Topics  . Smoking status: Never Smoker   . Smokeless tobacco: Not on file  . Alcohol Use: No    Review of Systems  Constitutional: Negative for fever and chills.  Respiratory: Positive for cough, shortness of breath and wheezing.   Gastrointestinal: Negative for vomiting and diarrhea.  Musculoskeletal: Negative for back pain.  All other systems reviewed and are negative.  Allergies  Pro-flora concentrate  Home Medications   Current Outpatient Rx  Name  Route  Sig  Dispense  Refill  . albuterol (PROVENTIL HFA;VENTOLIN HFA) 108 (90 BASE) MCG/ACT inhaler   Inhalation   Inhale 2 puffs into the lungs every 6 (six) hours as needed for wheezing or shortness of breath.   6.7 g   3   . amLODipine (NORVASC) 5 MG tablet   Oral   Take 5 mg by mouth daily.         . Calcium Carbonate-Vitamin D (CALCIUM 600 + D PO)   Oral   Take 2 tablets by mouth daily.         Marland Kitchen esomeprazole (NEXIUM) 40 MG capsule    Oral   Take 1 capsule (40 mg total) by mouth daily.   30 capsule   1   . fluticasone (VERAMYST) 27.5 MCG/SPRAY nasal spray   Nasal   Place 2 sprays into the nose at bedtime.          Marland Kitchen ibuprofen (ADVIL,MOTRIN) 800 MG tablet   Oral   Take 800 mg by mouth every 8 (eight) hours as needed for pain.         . mometasone-formoterol (DULERA) 200-5 MCG/ACT AERO   Inhalation   Inhale 2 puffs into the lungs 2 (two) times daily.         . montelukast (SINGULAIR) 10 MG tablet   Oral   Take 10 mg by mouth at bedtime.           . Multiple Vitamin (MULTIVITAMIN WITH MINERALS) TABS   Oral   Take 1 tablet by mouth daily.           Triage Vitals: BP 170/100  Pulse 135  Resp 40  SpO2 100% BP 142/73  Pulse 112  Temp(Src) 98.1 F (36.7 C) (Oral)  Resp 22  SpO2 100% (repeat vitals)  Physical Exam  CONSTITUTIONAL: Well developed/well nourished, anxious appearing HEAD: Normocephalic/atraumatic EYES: EOMI/PERRL ENMT: Mucous membranes moist NECK: supple no meningeal signs SPINE:entire spine nontender CV: S1/S2 noted, no murmurs/rubs/gallops noted LUNGS: prolonged expiratory phase wheezing bilaterally ABDOMEN: soft, nontender, no rebound or guarding GU:no cva tenderness NEURO: Pt is awake/alert, moves all extremitiesx4 EXTREMITIES: pulses normal, full ROM SKIN: warm, color normal PSYCH: anxious  ED Course  Procedures   DIAGNOSTIC STUDIES: Oxygen Saturation is 100% on room air, normal by my interpretation.    COORDINATION OF CARE: 4:41 PM-Discussed treatment plan which includes breathing treatment, BMP and CBC with pt at bedside and pt agreed to plan.   5:21 PM- Pt rechecked and feels improved. hypoK noted, will replace  7:41 PM Pt much improved, smiling watching TV.  She is ambulatory without any distress.  No hypoxia.  Her wheeze has improved.  She denies h/o intubation or recent ICU admission We discussed strict return precautions Stable for d/c  Labs Reviewed   CBC WITH DIFFERENTIAL - Abnormal; Notable for the following:    Platelets 146 (*)    Eosinophils Relative 15 (*)    Eosinophils Absolute 0.8 (*)    All other components within normal limits  BASIC METABOLIC PANEL - Abnormal; Notable for the following:    Potassium 3.0 (*)    Glucose, Bld 116 (*)    All other components within normal limits     MDM  Nursing notes including past medical history and social history reviewed and considered in documentation Labs/vital reviewed and considered     I personally performed the  services described in this documentation, which was scribed in my presence. The recorded information has been reviewed and is accurate.       Joya Gaskins, MD 03/10/13 [phone]

## 2013-03-10 NOTE — ED Notes (Signed)
Pt used home neb tx and inhaler with no relief.

## 2013-03-10 NOTE — ED Notes (Signed)
Pt ambulated well. Nad. Pt is much better than upon arrival. Aware edp will check in half hour

## 2013-03-17 ENCOUNTER — Other Ambulatory Visit: Payer: Self-pay | Admitting: Family Medicine

## 2013-04-22 ENCOUNTER — Encounter: Payer: Self-pay | Admitting: Gastroenterology

## 2013-04-23 ENCOUNTER — Encounter: Payer: Self-pay | Admitting: Gastroenterology

## 2013-04-23 ENCOUNTER — Ambulatory Visit (INDEPENDENT_AMBULATORY_CARE_PROVIDER_SITE_OTHER): Payer: 59 | Admitting: Gastroenterology

## 2013-04-23 VITALS — BP 116/71 | HR 57 | Temp 97.9°F | Ht 65.0 in | Wt 120.4 lb

## 2013-04-23 DIAGNOSIS — R1310 Dysphagia, unspecified: Secondary | ICD-10-CM

## 2013-04-23 DIAGNOSIS — K589 Irritable bowel syndrome without diarrhea: Secondary | ICD-10-CM

## 2013-04-23 DIAGNOSIS — K219 Gastro-esophageal reflux disease without esophagitis: Secondary | ICD-10-CM

## 2013-04-23 NOTE — Progress Notes (Signed)
Forwarded to PCP.

## 2013-04-23 NOTE — Patient Instructions (Signed)
CALL ME IF YOU SWALLOWING BECOMES A PROBLEM. YOU CAN BE STRETECHED AGAIN.  CONTINUE NEXIUM.  FOLLOW A LOW FAT DIET. SEE INFO BELOW.  FOLLOW UP IN 6 MOS.    Low-Fat Diet BREADS, CEREALS, PASTA, RICE, DRIED PEAS, AND BEANS These products are high in carbohydrates and most are low in fat. Therefore, they can be increased in the diet as substitutes for fatty foods. They too, however, contain calories and should not be eaten in excess. Cereals can be eaten for snacks as well as for breakfast.   FRUITS AND VEGETABLES It is good to eat fruits and vegetables. Besides being sources of fiber, both are rich in vitamins and some minerals. They help you get the daily allowances of these nutrients. Fruits and vegetables can be used for snacks and desserts.  MEATS Limit lean meat, chicken, [place], and fish to no more than 6 ounces per day. Beef, Pork, and Lamb Use lean cuts of beef, pork, and lamb. Lean cuts include:  Extra-lean ground beef.  Arm roast.  Sirloin tip.  Center-cut ham.  Round steak.  Loin chops.  Rump roast.  Tenderloin.  Trim all fat off the outside of meats before cooking. It is not necessary to severely decrease the intake of red meat, but lean choices should be made. Lean meat is rich in protein and contains a highly absorbable form of iron. Premenopausal women, in particular, should avoid reducing lean red meat because this could increase the risk for low red blood cells (iron-deficiency anemia).  Chicken and [place] These are good sources of protein. The fat of poultry can be reduced by removing the skin and underlying fat layers before cooking. Chicken and [place] can be substituted for lean red meat in the diet. Poultry should not be fried or covered with high-fat sauces. Fish and Shellfish Fish is a good source of protein. Shellfish contain cholesterol, but they usually are low in saturated fatty acids. The preparation of fish is important. Like chicken and [place], they should  not be fried or covered with high-fat sauces. EGGS Egg whites contain no fat or cholesterol. They can be eaten often. Try 1 to 2 egg whites instead of whole eggs in recipes or use egg substitutes that do not contain yolk. MILK AND DAIRY PRODUCTS Use skim or 1% milk instead of 2% or whole milk. Decrease whole milk, natural, and processed cheeses. Use nonfat or low-fat (2%) cottage cheese or low-fat cheeses made from vegetable oils. Choose nonfat or low-fat (1 to 2%) yogurt. Experiment with evaporated skim milk in recipes that call for heavy cream. Substitute low-fat yogurt or low-fat cottage cheese for sour cream in dips and salad dressings. Have at least 2 servings of low-fat dairy products, such as 2 glasses of skim (or 1%) milk each day to help get your daily calcium intake. FATS AND OILS Reduce the total intake of fats, especially saturated fat. Butterfat, lard, and beef fats are high in saturated fat and cholesterol. These should be avoided as much as possible. Vegetable fats do not contain cholesterol, but certain vegetable fats, such as coconut oil, palm oil, and palm kernel oil are very high in saturated fats. These should be limited. These fats are often used in bakery goods, processed foods, popcorn, oils, and nondairy creamers. Vegetable shortenings and some peanut butters contain hydrogenated oils, which are also saturated fats. Read the labels on these foods and check for saturated vegetable oils. Unsaturated vegetable oils and fats do not raise blood cholesterol. However, they  should be limited because they are fats and are high in calories. Total fat should still be limited to 30% of your daily caloric intake. Desirable liquid vegetable oils are corn oil, cottonseed oil, olive oil, canola oil, safflower oil, soybean oil, and sunflower oil. Peanut oil is not as good, but small amounts are acceptable. Buy a heart-healthy tub margarine that has no partially hydrogenated oils in the ingredients.  Mayonnaise and salad dressings often are made from unsaturated fats, but they should also be limited because of their high calorie and fat content. Seeds, nuts, peanut butter, olives, and avocados are high in fat, but the fat is mainly the unsaturated type. These foods should be limited mainly to avoid excess calories and fat. OTHER EATING TIPS Snacks  Most sweets should be limited as snacks. They tend to be rich in calories and fats, and their caloric content outweighs their nutritional value. Some good choices in snacks are graham crackers, melba toast, soda crackers, bagels (no egg), English muffins, fruits, and vegetables. These snacks are preferable to snack crackers, [place] fries, TORTILLA CHIPS, and POTATO chips. Popcorn should be air-popped or cooked in small amounts of liquid vegetable oil. Desserts Eat fruit, low-fat yogurt, and fruit ices instead of pastries, cake, and cookies. Sherbet, angel food cake, gelatin dessert, frozen low-fat yogurt, or other frozen products that do not contain saturated fat (pure fruit juice bars, frozen ice pops) are also acceptable.  COOKING METHODS Choose those methods that use little or no fat. They include: Poaching.  Braising.  Steaming.  Grilling.  Baking.  Stir-frying.  Broiling.  Microwaving.  Foods can be cooked in a nonstick pan without added fat, or use a nonfat cooking spray in regular cookware. Limit fried foods and avoid frying in saturated fat. Add moisture to lean meats by using water, broth, cooking wines, and other nonfat or low-fat sauces along with the cooking methods mentioned above. Soups and stews should be chilled after cooking. The fat that forms on top after a few hours in the refrigerator should be skimmed off. When preparing meals, avoid using excess salt. Salt can contribute to raising blood pressure in some people.  EATING AWAY FROM HOME Order entres, potatoes, and vegetables without sauces or butter. When meat exceeds the  size of a deck of cards (3 to 4 ounces), the rest can be taken home for another meal. Choose vegetable or fruit salads and ask for low-calorie salad dressings to be served on the side. Use dressings sparingly. Limit high-fat toppings, such as bacon, crumbled eggs, cheese, sunflower seeds, and olives. Ask for heart-healthy tub margarine instead of butter.

## 2013-04-23 NOTE — Progress Notes (Signed)
Subjective:    Patient ID: Hannah Hays, female    DOB: 1966-03-16, 47 y.o.   MRN: [id number]  PCP: [facility]  HPI SWALLOWING BETTER. BMs: QD. MAY HAVE PRESSURE FEELING IN MID UPPER ABD. PT DENIES FEVER, CHILLS, BRBPR, nausea, vomiting, melena, diarrhea, constipation, abd pain, problems swallowing, problems with sedation, heartburn or indigestion.  HAS AN 18 MO CHILD.  Past Medical History  Diagnosis Date  . Asthma   . Allergic rhinitis, seasonal   . Migraine headache 17    recurrence 1st time in 10 year in 2011  . IBS (irritable bowel syndrome)     pre-dominant diarrhea  . History of colonoscopy 2008    with simple adenoma  . Helicobacter pylori gastritis 2009    s/p Prevpac treatment  . GERD (gastroesophageal reflux disease)   . Hypertension     Past Surgical History  Procedure Laterality Date  . Partial hysterectomy  2002  . Tubal ligation  1995  . Cholecystectomy    . Colonoscopy  11/13/2007    SLF:A 4 mm sessile transverse colon polyp/Random biopsies obtained throughout the colon/Otherwise normal. Negative for microscopic colitis, simple adenoma, due for surveillance 11/2017 per [facility]  . Esophagogastroduodenoscopy  03/20/2008    SLF:A 1- to 2-cm hiatal hernia, mild erythema in the antrum/Normal esophagus without evidence of Barrett's, +H.pylori s/p Prevapc treatment   . Esophagogastroduodenoscopy (egd) with esophageal dilation N/A 02/14/2013    WUJ:WJXBJY Esophageal web/MILD Non-erosive gastritis (inflammation)   Allergies  Allergen Reactions  . Pro-Flora Concentrate Rash   Current Outpatient Prescriptions  Medication Sig Dispense Refill  . albuterol (PROVENTIL HFA;VENTOLIN HFA) 108 (90 BASE) MCG/ACT inhaler Inhale 2 puffs into the lungs every 6 (six) hours as needed for wheezing or shortness of breath.    Marland Kitchen albuterol (PROVENTIL) (2.5 MG/3ML) 0.083% nebulizer solution Take 3 mLs (2.5 mg total) by nebulization every 4 (four) hours as needed for wheezing.    Marland Kitchen  amLODipine (NORVASC) 5 MG tablet Take 5 mg by mouth at bedtime.     Marland Kitchen amLODipine (NORVASC) 5 MG tablet TAKE ONE TABLET BY MOUTH EVERY DAY    . Calcium Carbonate-Vitamin D (CALCIUM 600 + D PO) Take 2 tablets by mouth at bedtime.     . fluticasone (VERAMYST) 27.5 MCG/SPRAY nasal spray Place 2 sprays into the nose at bedtime.     . mometasone-formoterol (DULERA) 200-5 MCG/ACT AERO Inhale 2 puffs into the lungs 2 (two) times daily.    . montelukast (SINGULAIR) 10 MG tablet Take 10 mg by mouth at bedtime.      . Multiple Vitamin (MULTIVITAMIN WITH MINERALS) TABS Take 1 tablet by mouth at bedtime.     Marland Kitchen NEXIUM 40 MG capsule TAKE 1 CAPSULE (40 MG TOTAL)  BY MOUTH DAILY.    Marland Kitchen predniSONE (DELTASONE) 20 MG tablet 3 tabs po day one, then 2 po daily x 4 days  FOR SINUS, COMPLETED      Review of Systems     Objective:   Physical Exam  Vitals reviewed. Constitutional: She is oriented to person, place, and time. She appears well-nourished. No distress.  HENT:  Head: Normocephalic and atraumatic.  Mouth/Throat: Oropharynx is clear and moist. No oropharyngeal exudate.  Eyes: Pupils are equal, round, and reactive to light. No scleral icterus.  Neck: Normal range of motion. Neck supple.  Cardiovascular: Normal rate, regular rhythm and normal heart sounds.   Pulmonary/Chest: Effort normal and breath sounds normal. No respiratory distress.  Abdominal: Soft. Bowel sounds are normal.  She exhibits distension. There is no tenderness.  Musculoskeletal: She exhibits no edema.  Lymphadenopathy:    She has no cervical adenopathy.  Neurological: She is alert and oriented to person, place, and time.  NO FOCAL DEFICITS   Psychiatric: She has a normal mood and affect.          Assessment & Plan:

## 2013-04-23 NOTE — Assessment & Plan Note (Signed)
Sx CONTROLLED.

## 2013-04-23 NOTE — Assessment & Plan Note (Signed)
SX RESOLVED.  NEXIUM QD OPV IN 6 MOS. CALL IF SWALLOWING BECOMES AN ISSUE.

## 2013-04-23 NOTE — Assessment & Plan Note (Signed)
SX CONTROLLED.  NEXIUM QD  LOW FAT DIET  OPV IN 6 MOS  

## 2013-04-24 NOTE — Progress Notes (Signed)
Reminder in epic °

## 2013-05-24 NOTE — Progress Notes (Signed)
REVIEWED.  EGD/DIL MAR 2014 GASTRITIS, NL ESO bX

## 2013-07-10 ENCOUNTER — Encounter: Payer: Self-pay | Admitting: Family Medicine

## 2013-07-10 ENCOUNTER — Ambulatory Visit (INDEPENDENT_AMBULATORY_CARE_PROVIDER_SITE_OTHER): Payer: 59 | Admitting: Family Medicine

## 2013-07-10 VITALS — BP 136/80 | HR 60 | Resp 18 | Ht 65.75 in | Wt 119.1 lb

## 2013-07-10 DIAGNOSIS — J45909 Unspecified asthma, uncomplicated: Secondary | ICD-10-CM

## 2013-07-10 DIAGNOSIS — R7309 Other abnormal glucose: Secondary | ICD-10-CM

## 2013-07-10 DIAGNOSIS — E785 Hyperlipidemia, unspecified: Secondary | ICD-10-CM

## 2013-07-10 DIAGNOSIS — J301 Allergic rhinitis due to pollen: Secondary | ICD-10-CM

## 2013-07-10 DIAGNOSIS — Z1211 Encounter for screening for malignant neoplasm of colon: Secondary | ICD-10-CM

## 2013-07-10 DIAGNOSIS — I10 Essential (primary) hypertension: Secondary | ICD-10-CM

## 2013-07-10 DIAGNOSIS — G43909 Migraine, unspecified, not intractable, without status migrainosus: Secondary | ICD-10-CM

## 2013-07-10 DIAGNOSIS — R7302 Impaired glucose tolerance (oral): Secondary | ICD-10-CM

## 2013-07-10 LAB — HEMOGLOBIN A1C: Mean Plasma Glucose: 105 mg/dL (ref <117)

## 2013-07-10 LAB — BASIC METABOLIC PANEL
BUN: 11 mg/dL (ref 6–23)
Chloride: 103 mEq/L (ref 96–112)
Creat: 0.84 mg/dL (ref 0.50–1.10)
Glucose, Bld: 89 mg/dL (ref 70–99)

## 2013-07-10 MED ORDER — PREDNISONE 5 MG PO TABS: 5.0000 mg | ORAL_TABLET | Freq: Two times a day (BID) | ORAL | Status: AC

## 2013-07-10 NOTE — Patient Instructions (Addendum)
F/u early January, please call if you need me  Before.  Call in October for flu vaccine  Blood pressure is good.  Commit to daily exercise, eg walking for 30 minutes for good health, please.  Rectal exam today is normal , no hiddden blood in stool.  Chem 7 and HBA1C today  Prednisone is sent in for 5 days to help with nasal congestion

## 2013-07-10 NOTE — Progress Notes (Signed)
  Subjective:    Patient ID: Hannah Hays, female    DOB: 05-Dec-1965, 47 y.o.   MRN: [id number]  HPI The PT is here for follow up and re-evaluation of chronic medical conditions, medication management and review of any available recent lab and radiology data.  Preventive health is updated, specifically  Cancer screening and Immunization.   Questions or concerns regarding consultations or procedures which the PT has had in the interim are  Addressed.Had upper endo recently,  Dysphagia resolved The PT denies any adverse reactions to current medications since the last visit.  There are no new concerns.  C/o nasal congestion and stuffiness , surgery to correct this is not approved by her ins yet but is still being worked on    Review of Systems See HPI Denies recent fever or chills. Chronic  sinus pressure, nasal congestion,denies  ear pain or sore throat. Denies chest congestion, productive cough or wheezing. Denies chest pains, palpitations and leg swelling Denies abdominal pain, nausea, vomiting,diarrhea or constipation.   Denies dysuria, frequency, hesitancy or incontinence. Denies joint pain, swelling and limitation in mobility. Denies headaches, seizures, numbness, or tingling. Denies depression, anxiety or insomnia. Denies skin break down or rash.        Objective:   Physical Exam  Patient alert and oriented and in no cardiopulmonary distress.  HEENT: No facial asymmetry, EOMI, no sinus tenderness,  oropharynx pink and moist.  Neck supple no adenopathy.Nasl speech due to congestion  Chest: Clear to auscultation bilaterally.  CVS: S1, S2 no murmurs, no S3.  ABD: Soft non tender. Bowel sounds normal. Rectal : no mass, heme negative stool Ext: No edema  MS: Adequate ROM spine, shoulders, hips and knees.  Skin: Intact, no ulcerations or rash noted.  Psych: Good eye contact, normal affect. Memory intact not anxious or depressed appearing.  CNS: CN 2-12 intact,  power, tone and sensation normal throughout.       Assessment & Plan:

## 2013-07-13 NOTE — Assessment & Plan Note (Signed)
Ongoing problem with small nasal passages. Surgery recommended, but not covered. Short course of oral steroid for symptom relief

## 2013-07-13 NOTE — Assessment & Plan Note (Signed)
Controlled, no change in medication DASH diet and commitment to daily physical activity for a minimum of 30 minutes discussed and encouraged, as a part of hypertension management. The importance of attaining a healthy weight is also discussed.  

## 2013-07-13 NOTE — Assessment & Plan Note (Signed)
Currently stable and well controled

## 2013-07-13 NOTE — Assessment & Plan Note (Signed)
Low fat diet encouraged, no need for meds at this time

## 2013-07-13 NOTE — Assessment & Plan Note (Signed)
rept lab is normal, has adjusted diet, also pt is often on steroids which increases her risk as wellas a positive f/h  Patient educated about the importance of limiting  Carbohydrate intake , the need to commit to daily physical activity for a minimum of 30 minutes , and to commit weight loss. The fact that changes in all these areas will reduce or eliminate all together the development of diabetes is stressed.

## 2013-07-13 NOTE — Assessment & Plan Note (Signed)
Controlled, no recent headache in the past 3 month

## 2013-10-09 ENCOUNTER — Other Ambulatory Visit: Payer: Self-pay

## 2013-11-26 ENCOUNTER — Telehealth: Payer: Self-pay | Admitting: Family Medicine

## 2013-11-26 MED ORDER — AMLODIPINE BESYLATE 5 MG PO TABS: ORAL_TABLET | ORAL | Status: DC

## 2013-11-26 NOTE — Telephone Encounter (Signed)
meds refilled 

## 2013-12-10 ENCOUNTER — Encounter: Payer: Self-pay | Admitting: Family Medicine

## 2013-12-10 ENCOUNTER — Ambulatory Visit (INDEPENDENT_AMBULATORY_CARE_PROVIDER_SITE_OTHER): Payer: 59 | Admitting: Family Medicine

## 2013-12-10 VITALS — BP 120/80 | HR 58 | Resp 16 | Ht 65.0 in | Wt 118.1 lb

## 2013-12-10 DIAGNOSIS — F3289 Other specified depressive episodes: Secondary | ICD-10-CM

## 2013-12-10 DIAGNOSIS — E785 Hyperlipidemia, unspecified: Secondary | ICD-10-CM

## 2013-12-10 DIAGNOSIS — E559 Vitamin D deficiency, unspecified: Secondary | ICD-10-CM

## 2013-12-10 DIAGNOSIS — F32A Depression, unspecified: Secondary | ICD-10-CM

## 2013-12-10 DIAGNOSIS — I10 Essential (primary) hypertension: Secondary | ICD-10-CM

## 2013-12-10 DIAGNOSIS — J45909 Unspecified asthma, uncomplicated: Secondary | ICD-10-CM

## 2013-12-10 DIAGNOSIS — R1310 Dysphagia, unspecified: Secondary | ICD-10-CM

## 2013-12-10 DIAGNOSIS — J301 Allergic rhinitis due to pollen: Secondary | ICD-10-CM

## 2013-12-10 DIAGNOSIS — F329 Major depressive disorder, single episode, unspecified: Secondary | ICD-10-CM

## 2013-12-10 MED ORDER — MIRTAZAPINE 7.5 MG PO TABS: 7.5000 mg | ORAL_TABLET | Freq: Every day | ORAL | Status: DC

## 2013-12-10 MED ORDER — PREDNISONE (PAK) 5 MG PO TABS: 5.0000 mg | ORAL_TABLET | ORAL | Status: DC

## 2013-12-10 MED ORDER — METHYLPREDNISOLONE ACETATE 80 MG/ML IJ SUSP
80.0000 mg | Freq: Once | INTRAMUSCULAR | Status: AC
  Administered 2013-12-10: 80 mg via INTRAMUSCULAR

## 2013-12-10 MED ORDER — ALBUTEROL SULFATE (2.5 MG/3ML) 0.083% IN NEBU: 2.5000 mg | INHALATION_SOLUTION | RESPIRATORY_TRACT | Status: DC | PRN

## 2013-12-10 NOTE — Progress Notes (Signed)
   Subjective:    Patient ID: Hannah Hays, female    DOB: 1966/10/02, 48 y.o.   MRN: [id number]  HPI The PT is here for follow up and re-evaluation of chronic medical conditions, medication management and review of any available recent lab and radiology data.  Preventive health is updated, specifically  Cancer screening and Immunization.   Questions or concerns regarding consultations or procedures which the PT has had in the interim are  addressed. The PT denies any adverse reactions to current medications since the last visit.   2 week h/o increased nasal congestion with difficulty breathing  C/o depression, concerned over low weight and has become socially withdrawn asa a result      Review of Systems See HPI Denies recent fever or chills. Denies sinus pressure,  ear pain or sore throat. Denies chest congestion,or  productive cough denies uncontrolled  wheezing. Denies chest pains, palpitations and leg swelling Denies abdominal pain, nausea, vomiting,diarrhea or constipation.  Needs daily PPI Denies dysuria, frequency, hesitancy or incontinence. Denies joint pain, swelling and limitation in mobility. Denies headaches, seizures, numbness, or tingling.  Denies skin break down or rash.        Objective:   Physical Exam Patient alert and oriented and in no cardiopulmonary distress.  HEENT: No facial asymmetry, EOMI, no sinus tenderness,  oropharynx pink and moist.  Neck supple no adenopathy.Eryhtema and edema of nasal mucosa with hypertrophy of nasal septum  Chest: Clear to auscultation bilaterally.  CVS: S1, S2 no murmurs, no S3.  ABD: Soft non tender. Bowel sounds normal.  Ext: No edema  MS: Adequate ROM spine, shoulders, hips and knees.  Skin: Intact, no ulcerations or rash noted.  Psych: Good eye contact, normal affect. Memory intact not anxious mildly depressed appearing.  CNS: CN 2-12 intact, power, tone and sensation normal throughout.          Assessment & Plan:

## 2013-12-10 NOTE — Patient Instructions (Addendum)
F/u in 2.5 month, call if you need me  Before  New medication for depression start tonight  Depo medrol 80mg  Im for uncontrolled allergies followed by a prednisone dose pack  Fasting  Vit d,lipid, chem 7 and TSH  in 2.5 month, before visit  Neb solution for asthma will be refilled

## 2013-12-15 NOTE — Assessment & Plan Note (Signed)
Currently stable , refill neb solutiuon

## 2013-12-15 NOTE — Assessment & Plan Note (Signed)
Uncontrolled, depo medrol then prednisone dose pack

## 2013-12-15 NOTE — Assessment & Plan Note (Signed)
Start remeron, pt not suicidal or homicidal

## 2013-12-15 NOTE — Assessment & Plan Note (Signed)
Controlled, no change in medication  

## 2013-12-18 ENCOUNTER — Ambulatory Visit (INDEPENDENT_AMBULATORY_CARE_PROVIDER_SITE_OTHER): Payer: 59 | Admitting: Gastroenterology

## 2013-12-18 ENCOUNTER — Encounter: Payer: Self-pay | Admitting: Gastroenterology

## 2013-12-18 VITALS — BP 123/70 | HR 67 | Temp 97.6°F | Wt 126.0 lb

## 2013-12-18 DIAGNOSIS — K589 Irritable bowel syndrome without diarrhea: Secondary | ICD-10-CM

## 2013-12-18 DIAGNOSIS — R1310 Dysphagia, unspecified: Secondary | ICD-10-CM

## 2013-12-18 DIAGNOSIS — K219 Gastro-esophageal reflux disease without esophagitis: Secondary | ICD-10-CM

## 2013-12-18 NOTE — Progress Notes (Signed)
Subjective:    Patient ID: Hannah Hays, female    DOB: 08/28/1966, 48 y.o.   MRN: [id number] Tula Nakayama, MD  HPI GRANDSON GOT MUSICAL TOYS. GRAND-DAUGHTER-MARTE(JAN 11) HAD A BIRTHDAY. SHE LIKES TO IRON. SWALLOWING OK. OCCASIONAL BURNING ABD PAIN. OCCASIONAL DARK FORMED STOOL. BMs: DAILY. FINISHED PREDNISONE MON-SINUS TROUBLE. PT DENIES FEVER, CHILLS, BRBPR, nausea, vomiting, melena, diarrhea, constipation, problems swallowing, OR heartburn or indigestion.  Past Medical History  Diagnosis Date  . Asthma   . Allergic rhinitis, seasonal   . Migraine headache 17    recurrence 1st time in 10 year in 2011  . IBS (irritable bowel syndrome)     pre-dominant diarrhea  . History of colonoscopy 2008    with simple adenoma  . Helicobacter pylori gastritis 2009    s/p Prevpac treatment  . GERD (gastroesophageal reflux disease)   . Hypertension    Past Surgical History  Procedure Laterality Date  . Partial hysterectomy  2002  . Tubal ligation  1995  . Cholecystectomy    . Colonoscopy  11/13/2007    SLF:A 4 mm sessile transverse colon polyp/Random biopsies obtained throughout the colon/Otherwise normal. Negative for microscopic colitis, simple adenoma, due for surveillance 11/2017 per [facility]  . Esophagogastroduodenoscopy  03/20/2008    SLF:A 1- to 2-cm hiatal hernia, mild erythema in the antrum/Normal esophagus without evidence of Barrett's, +H.pylori s/p Prevapc treatment   . Esophagogastroduodenoscopy (egd) with esophageal dilation N/A 02/14/2013    WUJ:WJXBJY Esophageal web/MILD Non-erosive gastritis (inflammation)   Allergies  Allergen Reactions  . Flora-Q Rash    Current Outpatient Prescriptions  Medication Sig Dispense Refill  . albuterol (PROVENTIL HFA;VENTOLIN HFA) 108 (90 BASE) MCG/ACT inhaler Inhale 2 puffs into the lungs every 6 (six) hours as needed for wheezing or shortness of breath.    Marland Kitchen albuterol (PROVENTIL) (2.5 MG/3ML) 0.083% nebulizer solution Take 3 mLs (2.5  mg total) by nebulization every 4 (four) hours as needed for wheezing.    Marland Kitchen amLODipine (NORVASC) 5 MG tablet TAKE ONE TABLET BY MOUTH EVERY DAY    . Calcium Carbonate-Vitamin D (CALCIUM 600 + D PO) Take 2 tablets by mouth at bedtime.     . fluticasone (VERAMYST) 27.5 MCG/SPRAY nasal spray Place 2 sprays into the nose at bedtime.     . mirtazapine (REMERON) 7.5 MG tablet Take 1 tablet (7.5 mg total) by mouth at bedtime.    . mometasone-formoterol (DULERA) 200-5 MCG/ACT AERO Inhale 2 puffs into the lungs 2 (two) times daily.    . montelukast (SINGULAIR) 10 MG tablet Take 10 mg by mouth at bedtime.      . Multiple Vitamin (MULTIVITAMIN WITH MINERALS) TABS Take 1 tablet by mouth at bedtime.     Marland Kitchen NEXIUM 40 MG capsule TAKE 1 CAPSULE (40 MG TOTAL)  BY MOUTH DAILY.    Marland Kitchen          Review of Systems     Objective:   Physical Exam  Vitals reviewed. Constitutional: She is oriented to person, place, and time. She appears well-nourished. No distress.  HENT:  Head: Normocephalic and atraumatic.  Mouth/Throat: Oropharynx is clear and moist. No oropharyngeal exudate.  Eyes: Pupils are equal, round, and reactive to light. No scleral icterus.  Neck: Normal range of motion. Neck supple.  Cardiovascular: Normal rate, regular rhythm and normal heart sounds.   Pulmonary/Chest: Effort normal and breath sounds normal. No respiratory distress.  Abdominal: Soft. Bowel sounds are normal. She exhibits no distension. There is no tenderness.  Musculoskeletal: She exhibits no edema.  Lymphadenopathy:    She has no cervical adenopathy.  Neurological: She is alert and oriented to person, place, and time.  NO FOCAL DEFICITS   Psychiatric: She has a normal mood and affect.          Assessment & Plan:

## 2013-12-18 NOTE — Assessment & Plan Note (Signed)
SX CONTROLLED.  CONTINUE TO MONITOR SYMPTOMS.

## 2013-12-18 NOTE — Patient Instructions (Signed)
CONTINUE NEXIUM.   FOLLOW A LOW FAT/HIGH FIBER DIET. SEE INFO BELOW.   FOLLOW UP IN 6 MOS.   High-Fiber Diet A high-fiber diet changes your normal diet to include more whole grains, legumes, fruits, and vegetables. Changes in the diet involve replacing refined carbohydrates with unrefined foods. The calorie level of the diet is essentially unchanged. The Dietary Reference Intake (recommended amount) for adult males is 38 grams per day. For adult females, it is 25 grams per day. Pregnant and lactating women should consume 28 grams of fiber per day. Fiber is the intact part of a plant that is not broken down during digestion. Functional fiber is fiber that has been isolated from the plant to provide a beneficial effect in the body. PURPOSE  Increase stool bulk.   Ease and regulate bowel movements.   Lower cholesterol.  INDICATIONS THAT YOU NEED MORE FIBER  Constipation and hemorrhoids.   Uncomplicated diverticulosis (intestine condition) and irritable bowel syndrome.   Weight management.   As a protective measure against hardening of the arteries (atherosclerosis), diabetes, and cancer.   GUIDELINES FOR INCREASING FIBER IN THE DIET  Start adding fiber to the diet slowly. A gradual increase of about 5 more grams (2 slices of whole-wheat bread, 2 servings of most fruits or vegetables, or 1 bowl of high-fiber cereal) per day is best. Too rapid an increase in fiber may result in constipation, flatulence, and bloating.   Drink enough water and fluids to keep your urine clear or pale yellow. Water, juice, or caffeine-free drinks are recommended. Not drinking enough fluid may cause constipation.   Eat a variety of high-fiber foods rather than one type of fiber.   Try to increase your intake of fiber through using high-fiber foods rather than fiber pills or supplements that contain small amounts of fiber.   The goal is to change the types of food eaten. Do not supplement your present diet  with high-fiber foods, but replace foods in your present diet.  INCLUDE A VARIETY OF FIBER SOURCES  Replace refined and processed grains with whole grains, canned fruits with fresh fruits, and incorporate other fiber sources. White rice, white breads, and most bakery goods contain little or no fiber.   Brown whole-grain rice, buckwheat oats, and many fruits and vegetables are all good sources of fiber. These include: broccoli, Brussels sprouts, cabbage, cauliflower, beets, sweet potatoes, white potatoes (skin on), carrots, tomatoes, eggplant, squash, berries, fresh fruits, and dried fruits.   Cereals appear to be the richest source of fiber. Cereal fiber is found in whole grains and bran. Bran is the fiber-rich outer coat of cereal grain, which is largely removed in refining. In whole-grain cereals, the bran remains. In breakfast cereals, the largest amount of fiber is found in those with "bran" in their names. The fiber content is sometimes indicated on the label.   You may need to include additional fruits and vegetables each day.   In baking, for 1 cup white flour, you may use the following substitutions:   1 cup whole-wheat flour minus 2 tablespoons.   1/2 cup white flour plus 1/2 cup whole-wheat flour.   Low-Fat Diet BREADS, CEREALS, PASTA, RICE, DRIED PEAS, AND BEANS These products are high in carbohydrates and most are low in fat. Therefore, they can be increased in the diet as substitutes for fatty foods. They too, however, contain calories and should not be eaten in excess. Cereals can be eaten for snacks as well as for breakfast.  FRUITS AND VEGETABLES It is good to eat fruits and vegetables. Besides being sources of fiber, both are rich in vitamins and some minerals. They help you get the daily allowances of these nutrients. Fruits and vegetables can be used for snacks and desserts.  MEATS Limit lean meat, chicken, [place], and fish to no more than 6 ounces per day. Beef, Pork,  and Lamb Use lean cuts of beef, pork, and lamb. Lean cuts include:  Extra-lean ground beef.  Arm roast.  Sirloin tip.  Center-cut ham.  Round steak.  Loin chops.  Rump roast.  Tenderloin.  Trim all fat off the outside of meats before cooking. It is not necessary to severely decrease the intake of red meat, but lean choices should be made. Lean meat is rich in protein and contains a highly absorbable form of iron. Premenopausal women, in particular, should avoid reducing lean red meat because this could increase the risk for low red blood cells (iron-deficiency anemia).  Chicken and [place] These are good sources of protein. The fat of poultry can be reduced by removing the skin and underlying fat layers before cooking. Chicken and [place] can be substituted for lean red meat in the diet. Poultry should not be fried or covered with high-fat sauces. Fish and Shellfish Fish is a good source of protein. Shellfish contain cholesterol, but they usually are low in saturated fatty acids. The preparation of fish is important. Like chicken and [place], they should not be fried or covered with high-fat sauces. EGGS Egg whites contain no fat or cholesterol. They can be eaten often. Try 1 to 2 egg whites instead of whole eggs in recipes or use egg substitutes that do not contain yolk. MILK AND DAIRY PRODUCTS Use skim or 1% milk instead of 2% or whole milk. Decrease whole milk, natural, and processed cheeses. Use nonfat or low-fat (2%) cottage cheese or low-fat cheeses made from vegetable oils. Choose nonfat or low-fat (1 to 2%) yogurt. Experiment with evaporated skim milk in recipes that call for heavy cream. Substitute low-fat yogurt or low-fat cottage cheese for sour cream in dips and salad dressings. Have at least 2 servings of low-fat dairy products, such as 2 glasses of skim (or 1%) milk each day to help get your daily calcium intake. FATS AND OILS Reduce the total intake of fats, especially saturated fat.  Butterfat, lard, and beef fats are high in saturated fat and cholesterol. These should be avoided as much as possible. Vegetable fats do not contain cholesterol, but certain vegetable fats, such as coconut oil, palm oil, and palm kernel oil are very high in saturated fats. These should be limited. These fats are often used in bakery goods, processed foods, popcorn, oils, and nondairy creamers. Vegetable shortenings and some peanut butters contain hydrogenated oils, which are also saturated fats. Read the labels on these foods and check for saturated vegetable oils. Unsaturated vegetable oils and fats do not raise blood cholesterol. However, they should be limited because they are fats and are high in calories. Total fat should still be limited to 30% of your daily caloric intake. Desirable liquid vegetable oils are corn oil, cottonseed oil, olive oil, canola oil, safflower oil, soybean oil, and sunflower oil. Peanut oil is not as good, but small amounts are acceptable. Buy a heart-healthy tub margarine that has no partially hydrogenated oils in the ingredients. Mayonnaise and salad dressings often are made from unsaturated fats, but they should also be limited because of their high calorie and fat  content. Seeds, nuts, peanut butter, olives, and avocados are high in fat, but the fat is mainly the unsaturated type. These foods should be limited mainly to avoid excess calories and fat. OTHER EATING TIPS Snacks  Most sweets should be limited as snacks. They tend to be rich in calories and fats, and their caloric content outweighs their nutritional value. Some good choices in snacks are graham crackers, melba toast, soda crackers, bagels (no egg), English muffins, fruits, and vegetables. These snacks are preferable to snack crackers, [place] fries, TORTILLA CHIPS, and POTATO chips. Popcorn should be air-popped or cooked in small amounts of liquid vegetable oil. Desserts Eat fruit, low-fat yogurt, and fruit ices  instead of pastries, cake, and cookies. Sherbet, angel food cake, gelatin dessert, frozen low-fat yogurt, or other frozen products that do not contain saturated fat (pure fruit juice bars, frozen ice pops) are also acceptable.  COOKING METHODS Choose those methods that use little or no fat. They include: Poaching.  Braising.  Steaming.  Grilling.  Baking.  Stir-frying.  Broiling.  Microwaving.  Foods can be cooked in a nonstick pan without added fat, or use a nonfat cooking spray in regular cookware. Limit fried foods and avoid frying in saturated fat. Add moisture to lean meats by using water, broth, cooking wines, and other nonfat or low-fat sauces along with the cooking methods mentioned above. Soups and stews should be chilled after cooking. The fat that forms on top after a few hours in the refrigerator should be skimmed off. When preparing meals, avoid using excess salt. Salt can contribute to raising blood pressure in some people.  EATING AWAY FROM HOME Order entres, potatoes, and vegetables without sauces or butter. When meat exceeds the size of a deck of cards (3 to 4 ounces), the rest can be taken home for another meal. Choose vegetable or fruit salads and ask for low-calorie salad dressings to be served on the side. Use dressings sparingly. Limit high-fat toppings, such as bacon, crumbled eggs, cheese, sunflower seeds, and olives. Ask for heart-healthy tub margarine instead of butter.

## 2013-12-18 NOTE — Progress Notes (Signed)
cc'd to pcp 

## 2013-12-18 NOTE — Assessment & Plan Note (Signed)
SX CONTROLLED.

## 2013-12-18 NOTE — Assessment & Plan Note (Signed)
SX CONTROLLED.  NEXIUM QD  LOW FAT DIET  OPV IN 6 MOS

## 2013-12-19 NOTE — Progress Notes (Signed)
Reminder in epic °

## 2014-01-06 ENCOUNTER — Other Ambulatory Visit: Payer: Self-pay | Admitting: Family Medicine

## 2014-01-06 DIAGNOSIS — Z139 Encounter for screening, unspecified: Secondary | ICD-10-CM

## 2014-02-05 ENCOUNTER — Ambulatory Visit (HOSPITAL_COMMUNITY)
Admission: RE | Admit: 2014-02-05 | Discharge: 2014-02-05 | Disposition: A | Discharge status: Home / Self Care | Payer: 59 | Source: Ambulatory Visit | Attending: Family Medicine | Admitting: Family Medicine

## 2014-02-05 ENCOUNTER — Ambulatory Visit (HOSPITAL_COMMUNITY): Payer: 59

## 2014-02-05 DIAGNOSIS — Z139 Encounter for screening, unspecified: Secondary | ICD-10-CM

## 2014-02-05 DIAGNOSIS — Z1231 Encounter for screening mammogram for malignant neoplasm of breast: Secondary | ICD-10-CM | POA: Insufficient documentation

## 2014-03-03 ENCOUNTER — Other Ambulatory Visit: Payer: Self-pay | Admitting: Family Medicine

## 2014-03-03 LAB — LIPID PANEL
CHOLESTEROL: 220 mg/dL — AB (ref 0–200)
HDL: 59 mg/dL (ref >39)
LDL CALC: 150 mg/dL — AB (ref 0–99)
Total CHOL/HDL Ratio: 3.7 Ratio
Triglycerides: 53 mg/dL (ref <150)
VLDL: 11 mg/dL (ref 0–40)

## 2014-03-03 LAB — BASIC METABOLIC PANEL
BUN: 10 mg/dL (ref 6–23)
CHLORIDE: 104 meq/L (ref 96–112)
CO2: 29 mEq/L (ref 19–32)
Calcium: 9 mg/dL (ref 8.4–10.5)
Creat: 0.72 mg/dL (ref 0.50–1.10)
Glucose, Bld: 94 mg/dL (ref 70–99)
Potassium: 3.7 mEq/L (ref 3.5–5.3)
Sodium: 141 mEq/L (ref 135–145)

## 2014-03-03 LAB — VITAMIN D 25 HYDROXY (VIT D DEFICIENCY, FRACTURES): Vit D, 25-Hydroxy: 19 ng/mL — ABNORMAL LOW (ref 30–89)

## 2014-03-03 LAB — TSH: TSH: 1.417 u[IU]/mL (ref 0.350–4.500)

## 2014-03-04 LAB — HEPATIC FUNCTION PANEL
ALT: 16 U/L (ref 0–35)
AST: 18 U/L (ref 0–37)
Albumin: 4.4 g/dL (ref 3.5–5.2)
Alkaline Phosphatase: 61 U/L (ref 39–117)
BILIRUBIN DIRECT: 0.1 mg/dL (ref 0.0–0.3)
BILIRUBIN TOTAL: 0.4 mg/dL (ref 0.2–1.2)
Indirect Bilirubin: 0.3 mg/dL (ref 0.2–1.2)
Total Protein: 7.1 g/dL (ref 6.0–8.3)

## 2014-03-05 ENCOUNTER — Encounter: Payer: Self-pay | Admitting: Family Medicine

## 2014-03-05 ENCOUNTER — Ambulatory Visit (INDEPENDENT_AMBULATORY_CARE_PROVIDER_SITE_OTHER): Payer: 59 | Admitting: Family Medicine

## 2014-03-05 VITALS — BP 138/84 | HR 66 | Resp 16 | Wt 133.0 lb

## 2014-03-05 DIAGNOSIS — R7302 Impaired glucose tolerance (oral): Secondary | ICD-10-CM

## 2014-03-05 DIAGNOSIS — I10 Essential (primary) hypertension: Secondary | ICD-10-CM

## 2014-03-05 DIAGNOSIS — E559 Vitamin D deficiency, unspecified: Secondary | ICD-10-CM

## 2014-03-05 DIAGNOSIS — E785 Hyperlipidemia, unspecified: Secondary | ICD-10-CM

## 2014-03-05 DIAGNOSIS — F32A Depression, unspecified: Secondary | ICD-10-CM

## 2014-03-05 DIAGNOSIS — J45909 Unspecified asthma, uncomplicated: Secondary | ICD-10-CM

## 2014-03-05 DIAGNOSIS — J301 Allergic rhinitis due to pollen: Secondary | ICD-10-CM

## 2014-03-05 DIAGNOSIS — F329 Major depressive disorder, single episode, unspecified: Secondary | ICD-10-CM

## 2014-03-05 DIAGNOSIS — R7309 Other abnormal glucose: Secondary | ICD-10-CM

## 2014-03-05 DIAGNOSIS — F3289 Other specified depressive episodes: Secondary | ICD-10-CM

## 2014-03-05 MED ORDER — VITAMIN D (ERGOCALCIFEROL) 1.25 MG (50000 UNIT) PO CAPS: 50000.0000 [IU] | ORAL_CAPSULE | ORAL | Status: DC

## 2014-03-05 NOTE — Patient Instructions (Addendum)
F/u in 5.5 month, call if you need me before  Blood pressure is slightly higher than needs to be, also cholesterol is high, please increase intake  of fresh/frozen fruit and vegetable, and reduce fried and fatty food and salt intake.  Commit to daily exercise.  Excellent response to remron, continue same dose  Start once weekly vit D, need this for 6 months, and continue the daily calcium with D , your bones  Need the extra protection.  Schedule f/u appt with Dr Benjamine Mola  Fasting lipid, chem 7 and Vit D in 5.5 month pl,ease

## 2014-03-07 NOTE — Progress Notes (Signed)
   Subjective:    Patient ID: Hannah Hays, female    DOB: 05-Dec-1965, 48 y.o.   MRN: [id number]  HPI The PT is here for follow up and re-evaluation of chronic medical conditions, medication management and review of any available recent lab and radiology data.  Preventive health is updated, specifically  Cancer screening and Immunization.   Questions or concerns regarding consultations or procedures which the PT has had in the interim are  addressed. The PT denies any adverse reactions to current medications since the last visit.  There are no new concerns.  There are no specific complaints       Review of Systems See HPI Denies recent fever or chills. Denies sinus pressure, nasal congestion, ear pain or sore throat. Denies chest congestion, productive cough or wheezing. Denies chest pains, palpitations and leg swelling Denies abdominal pain, nausea, vomiting,diarrhea or constipation.   Denies dysuria, frequency, hesitancy or incontinence. Denies joint pain, swelling and limitation in mobility. Denies headaches, seizures, numbness, or tingling. Denies depression, anxiety or insomnia. Denies skin break down or rash.        Objective:   Physical Exam  BP 138/84  Pulse 66  Resp 16  Wt 133 lb (60.328 kg)  SpO2 97% Patient alert and oriented and in no cardiopulmonary distress.  HEENT: No facial asymmetry, EOMI, no sinus tenderness,  oropharynx pink and moist.  Neck supple no adenopathy.  Chest: Clear to auscultation bilaterally.  CVS: S1, S2 no murmurs, no S3.  ABD: Soft non tender. Bowel sounds normal.  Ext: No edema  MS: Adequate ROM spine, shoulders, hips and knees.  Skin: Intact, no ulcerations or rash noted.  Psych: Good eye contact, normal affect. Memory intact not anxious or depressed appearing.  CNS: CN 2-12 intact, power, tone and sensation normal throughout.       Assessment & Plan:  HTN (hypertension) Sub optimal control, no med  change Lifestyle change only at this time DASH diet and commitment to daily physical activity for a minimum of 30 minutes discussed and encouraged, as a part of hypertension management. The importance of attaining a healthy weight is also discussed.   ASTHMA Controlled, no change in medication   ALLERGIC RHINITIS, SEASONAL Controlled, no change in medication   Depression Marked improvement, patient to continue med at same dose  Vitamin D deficiency Supplemental vit d and also calcium  Dyslipidemia Deeteriorated. Hyerlipidemia:Low fat diet discussed and encouraged.  No meds at this time Updated lab needed at/ before next visit.   IGT (impaired glucose tolerance) Patient educated about the importance of limiting  Carbohydrate intake , the need to commit to daily physical activity for a minimum of 30 minutes , and to commit weight loss. The fact that changes in all these areas will reduce or eliminate all together the development of diabetes is stressed.   Updated lab needed at/ before next visit.

## 2014-03-07 NOTE — Assessment & Plan Note (Signed)
Controlled, no change in medication  

## 2014-03-07 NOTE — Assessment & Plan Note (Signed)
Patient educated about the importance of limiting  Carbohydrate intake , the need to commit to daily physical activity for a minimum of 30 minutes , and to commit weight loss. The fact that changes in all these areas will reduce or eliminate all together the development of diabetes is stressed.   Updated lab needed at/ before next visit.  

## 2014-03-07 NOTE — Assessment & Plan Note (Signed)
Supplemental vit d and also calcium

## 2014-03-07 NOTE — Assessment & Plan Note (Signed)
Sub optimal control, no med change Lifestyle change only at this time DASH diet and commitment to daily physical activity for a minimum of 30 minutes discussed and encouraged, as a part of hypertension management. The importance of attaining a healthy weight is also discussed.

## 2014-03-07 NOTE — Assessment & Plan Note (Signed)
Marked improvement, patient to continue med at same dose

## 2014-03-07 NOTE — Assessment & Plan Note (Addendum)
Deeteriorated. Hyerlipidemia:Low fat diet discussed and encouraged.  No meds at this time Updated lab needed at/ before next visit.

## 2014-03-11 ENCOUNTER — Other Ambulatory Visit: Payer: Self-pay | Admitting: Family Medicine

## 2014-07-01 ENCOUNTER — Encounter: Payer: Self-pay | Admitting: Gastroenterology

## 2014-08-05 ENCOUNTER — Ambulatory Visit: Payer: 59 | Admitting: Family Medicine

## 2014-08-20 ENCOUNTER — Encounter: Payer: Self-pay | Admitting: Family Medicine

## 2014-08-20 ENCOUNTER — Ambulatory Visit (INDEPENDENT_AMBULATORY_CARE_PROVIDER_SITE_OTHER): Payer: BC Managed Care – PPO | Admitting: Family Medicine

## 2014-08-20 VITALS — BP 138/82 | HR 80 | Resp 16 | Ht 65.0 in | Wt 135.0 lb

## 2014-08-20 DIAGNOSIS — F329 Major depressive disorder, single episode, unspecified: Secondary | ICD-10-CM

## 2014-08-20 DIAGNOSIS — J301 Allergic rhinitis due to pollen: Secondary | ICD-10-CM

## 2014-08-20 DIAGNOSIS — I10 Essential (primary) hypertension: Secondary | ICD-10-CM

## 2014-08-20 DIAGNOSIS — F3289 Other specified depressive episodes: Secondary | ICD-10-CM

## 2014-08-20 DIAGNOSIS — F32A Depression, unspecified: Secondary | ICD-10-CM

## 2014-08-20 DIAGNOSIS — J310 Chronic rhinitis: Secondary | ICD-10-CM | POA: Insufficient documentation

## 2014-08-20 DIAGNOSIS — J339 Nasal polyp, unspecified: Secondary | ICD-10-CM

## 2014-08-20 DIAGNOSIS — R7309 Other abnormal glucose: Secondary | ICD-10-CM

## 2014-08-20 DIAGNOSIS — R7302 Impaired glucose tolerance (oral): Secondary | ICD-10-CM

## 2014-08-20 DIAGNOSIS — J45909 Unspecified asthma, uncomplicated: Secondary | ICD-10-CM

## 2014-08-20 DIAGNOSIS — E785 Hyperlipidemia, unspecified: Secondary | ICD-10-CM

## 2014-08-20 MED ORDER — METHYLPREDNISOLONE ACETATE 80 MG/ML IJ SUSP
80.0000 mg | Freq: Once | INTRAMUSCULAR | Status: AC
  Administered 2014-08-20: 80 mg via INTRAMUSCULAR

## 2014-08-20 MED ORDER — AZELASTINE HCL 0.1 % NA SOLN: 2.0000 | Freq: Two times a day (BID) | NASAL | Status: DC

## 2014-08-20 MED ORDER — PREDNISONE (PAK) 5 MG PO TABS: 5.0000 mg | ORAL_TABLET | ORAL | Status: DC

## 2014-08-20 MED ORDER — MIRTAZAPINE 7.5 MG PO TABS: 7.5000 mg | ORAL_TABLET | Freq: Every day | ORAL | Status: DC

## 2014-08-20 NOTE — Assessment & Plan Note (Signed)
Had deteriorated in 02/2014 Hyperlipidemia:Low fat diet discussed and encouraged.  Updated lab needed .

## 2014-08-20 NOTE — Assessment & Plan Note (Signed)
Patient educated about the importance of limiting  Carbohydrate intake , the need to commit to daily physical activity for a minimum of 30 minutes , and to commit weight loss. The fact that changes in all these areas will reduce or eliminate all together the development of diabetes is stressed.   Will have labs drawn in October through her job and leave them here for my review

## 2014-08-20 NOTE — Assessment & Plan Note (Signed)
Controlled, no change in medication  

## 2014-08-20 NOTE — Patient Instructions (Addendum)
F/u in 5.5 month, call if you need me before  Blood pressure is good, and you are doing well  For uncontrolled nasal congestion and allergies, short course of prednisone is sent in anda new nasal spray is added to the one you use and the saline flushes , hopefully this will help your breathing  Exam in the armpits just shows some mild tenderness along muscle tendons, hopefully the prednisone will let the intermittent discomfort you experience stop  All the best for the rest of the year,call if you need me and pls drop off labs  Flu vaccine at your job as discussed

## 2014-08-20 NOTE — Progress Notes (Signed)
Subjective:    Patient ID: Hannah Hays, female    DOB: May 14, 1966, 48 y.o.   MRN: [id number]  HPI The PT is here for follow up and re-evaluation of chronic medical conditions, medication management and review of any available recent lab and radiology data.  Preventive health is updated, specifically  Cancer screening and Immunization. Will get flu vaccine next month at work  Questions or concerns regarding consultations or procedures which the PT has had in the interim are  addressed. The PT denies any adverse reactions to current medications since the last visit.  Increased nasal congestion with difficulty breathing in the past month, unable to see ENT due to cost  No fevr or chills Three months ago, noted tender areas under both armpits, use "fat back" for relief, there  Was no drainage, still experiences intermittent "twinges" in the areas often associated with upper extremity movement , pain radiaites down upper arms    Review of Systems See HPI Denies recent fever or chills. Denies sinus pressure,  ear pain or sore throat. Denies chest congestion, productive cough or wheezing. Denies chest pains, palpitations and leg swelling Denies abdominal pain, nausea, vomiting,diarrhea or constipation.   Denies dysuria, frequency, hesitancy or incontinence. Denies joint pain, swelling and limitation in mobility. Denies headaches, seizures, numbness, or tingling. Denies depression, anxiety or insomnia. Denies skin break down or rash.        Objective:   Physical Exam  BP 138/82  Pulse 80  Resp 16  Ht 5\' 5"  (1.651 m)  Wt 135 lb (61.236 kg)  BMI 22.47 kg/m2  SpO2 99% Patient alert and oriented and in no cardiopulmonary distress.  HEENT: No facial asymmetry, EOMI,   oropharynx pink and moist.  Neck supple no JVD, no mass. Nasal mucosa swollen erythematous , with excess clear mucus present in both nostrils.TM clear bilaterlly Chest: Clear to auscultation bilaterally.  CVS:  S1, S2 no murmurs, no S3.Regular rate.  ABD: Soft non tender.   Ext: No edema  MS: Adequate ROM spine, shoulders, hips and knees.  Skin: Intact, no ulcerations or rash noted.Areas of concern in both axillae are negative for erythema, warmth, drainage or rash.  Psych: Good eye contact, normal affect. Memory intact not anxious or depressed appearing.  CNS: CN 2-12 intact, power,  normal throughout.no focal deficits noted.       Assessment & Plan:  Depression Controlled, no change in medication   ALLERGIC RHINITIS, SEASONAL Increased and uncontrolled. 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 The fact that changes in all these areas will reduce or eliminate all together the development of diabetes is stressed.   Will have labs drawn in October through her job and leave them here for my review  Dyslipidemia Had deteriorated in 02/2014 Hyperlipidemia:Low fat diet discussed and encouraged.  Updated lab needed .   ASTHMA Controlled, no change in medication Has not needed albuterol for over 5  months reportedly

## 2014-08-20 NOTE — Assessment & Plan Note (Signed)
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

## 2014-08-20 NOTE — Assessment & Plan Note (Signed)
Increased and uncontrolled. Depo medrol 80mg  in office and 6 days of prednisone Add astelin nasal spray

## 2014-08-20 NOTE — Assessment & Plan Note (Signed)
Controlled, no change in medication Has not needed albuterol for over 5 months reportedly

## 2014-09-22 ENCOUNTER — Telehealth: Payer: Self-pay | Admitting: Family Medicine

## 2014-09-22 DIAGNOSIS — E785 Hyperlipidemia, unspecified: Secondary | ICD-10-CM

## 2014-09-22 DIAGNOSIS — I10 Essential (primary) hypertension: Secondary | ICD-10-CM

## 2014-09-22 DIAGNOSIS — R7302 Impaired glucose tolerance (oral): Secondary | ICD-10-CM

## 2014-09-22 NOTE — Telephone Encounter (Signed)
Pls let her know I recieived her labs, cholesterol , total and "bad " are too high, needs to reduce fried and fatty foods , red meat and cheese, and butter (236 and 153) also blood sugar a bit higher than it should be so reduce carbs and sweets  Needs fasting lipid, chem 7 and HBa1C in 4 to 6 months, near to her next visit, pls let her know and order

## 2014-09-24 NOTE — Addendum Note (Signed)
Addended by: Denman George B on: 09/24/2014 11:28 AM   Modules accepted: Orders

## 2014-09-25 NOTE — Telephone Encounter (Signed)
Patient aware.  Lab order mailed to patient.

## 2015-02-08 ENCOUNTER — Ambulatory Visit: Payer: BC Managed Care – PPO | Admitting: Family Medicine

## 2015-02-11 ENCOUNTER — Encounter: Payer: Self-pay | Admitting: Family Medicine

## 2015-02-11 ENCOUNTER — Other Ambulatory Visit: Payer: Self-pay | Admitting: Family Medicine

## 2015-02-11 ENCOUNTER — Ambulatory Visit: Payer: BC Managed Care – PPO | Admitting: Family Medicine

## 2015-02-11 ENCOUNTER — Ambulatory Visit (INDEPENDENT_AMBULATORY_CARE_PROVIDER_SITE_OTHER): Payer: BLUE CROSS/BLUE SHIELD | Admitting: Family Medicine

## 2015-02-11 VITALS — BP 124/84 | HR 66 | Resp 18 | Ht 65.0 in | Wt 133.1 lb

## 2015-02-11 DIAGNOSIS — Z1211 Encounter for screening for malignant neoplasm of colon: Secondary | ICD-10-CM

## 2015-02-11 DIAGNOSIS — I10 Essential (primary) hypertension: Secondary | ICD-10-CM

## 2015-02-11 DIAGNOSIS — R160 Hepatomegaly, not elsewhere classified: Secondary | ICD-10-CM

## 2015-02-11 DIAGNOSIS — R7302 Impaired glucose tolerance (oral): Secondary | ICD-10-CM

## 2015-02-11 DIAGNOSIS — Z1231 Encounter for screening mammogram for malignant neoplasm of breast: Secondary | ICD-10-CM

## 2015-02-11 DIAGNOSIS — Z1159 Encounter for screening for other viral diseases: Secondary | ICD-10-CM

## 2015-02-11 DIAGNOSIS — J4540 Moderate persistent asthma, uncomplicated: Secondary | ICD-10-CM

## 2015-02-11 DIAGNOSIS — F329 Major depressive disorder, single episode, unspecified: Secondary | ICD-10-CM

## 2015-02-11 DIAGNOSIS — I889 Nonspecific lymphadenitis, unspecified: Secondary | ICD-10-CM | POA: Insufficient documentation

## 2015-02-11 DIAGNOSIS — E559 Vitamin D deficiency, unspecified: Secondary | ICD-10-CM

## 2015-02-11 DIAGNOSIS — J310 Chronic rhinitis: Secondary | ICD-10-CM

## 2015-02-11 DIAGNOSIS — F32A Depression, unspecified: Secondary | ICD-10-CM

## 2015-02-11 DIAGNOSIS — E785 Hyperlipidemia, unspecified: Secondary | ICD-10-CM

## 2015-02-11 LAB — POC HEMOCCULT BLD/STL (OFFICE/1-CARD/DIAGNOSTIC): FECAL OCCULT BLD: NEGATIVE

## 2015-02-11 MED ORDER — LORAZEPAM 1 MG PO TABS: ORAL_TABLET | ORAL | Status: DC

## 2015-02-11 NOTE — Assessment & Plan Note (Signed)
Controlled, no change in medication DASH diet and commitment to daily physical activity for a minimum of 30 minutes discussed and encouraged, as a part of hypertension management. The importance of attaining a healthy weight is also discussed.  

## 2015-02-11 NOTE — Patient Instructions (Addendum)
F/u in 4.5 month, call if you need  Me before  Fasting labs in am please  Please schedule your mammogram which is due due  You are referred for MRI of liver and also to see Dr Arnoldo Morale     Stool test is normal   Two tabs sent for use if needed to calm you prior to MRI

## 2015-02-11 NOTE — Assessment & Plan Note (Signed)
Controlled, no change in medication  

## 2015-02-11 NOTE — Assessment & Plan Note (Signed)
Controlled, but due to anatomy of nasal passage, significant difficulty nose breathing

## 2015-02-11 NOTE — Assessment & Plan Note (Addendum)
6 month h/o painful left adenitis refer for surgical consult

## 2015-02-11 NOTE — Assessment & Plan Note (Signed)
Patient educated about the importance of limiting  Carbohydrate intake , the need to commit to daily physical activity for a minimum of 30 minutes , and to commit weight loss. The fact that changes in all these areas will reduce or eliminate all together the development of diabetes is stressed.   Updated lab needed at/ before next visit.  

## 2015-02-11 NOTE — Assessment & Plan Note (Signed)
C/o feeling fullness when bending forward, abnormal imaging study in 2009 , requires follow up scan

## 2015-02-11 NOTE — Progress Notes (Signed)
Subjective:    Patient ID: Hannah Hays, female    DOB: 1966-01-16, 49 y.o.   MRN: [id number]  HPI The PT is here for follow up and re-evaluation of chronic medical conditions, medication management and review of any available recent lab and radiology data.  Preventive health is updated, specifically  Cancer screening and Immunization.    The PT denies any adverse reactions to current medications since the last visit.  C/o ongoing pain in left  armpit radiating from localized area to upper inner inner left arm midway to elbow, no drainage or warmth noted, no fever, feels "knot" for over 5 months, had decreased in size after antibiotic course but recurs C/o feeling lumps in RUQ which seem to "fall over" when she bends forward, record review reveals MRI liver was recommended in the past , this is being requested on an urgent  Still excessive nasal swelling making breathing very difficult, awaiting approval for necessary surgery for over 2 years       Review of Systems See HPI Denies recent fever or chills. Denies sinus pressure, nasal congestion, ear pain or sore throat. Denies chest congestion, productive cough or wheezing. Denies chest pains, palpitations and leg swelling Denies abdominal pain, nausea, vomiting,diarrhea or constipation.No change in BM   Denies dysuria, frequency, hesitancy or incontinence. Denies joint pain, swelling and limitation in mobility. Denies headaches, seizures, numbness, or tingling. Denies depression, anxiety or insomnia. Denies skin break down or rash.        Objective:   Physical Exam  BP 124/84 mmHg  Pulse 66  Resp 18  Ht 5\' 5"  (1.651 m)  Wt 133 lb 1.9 oz (60.383 kg)  BMI 22.15 kg/m2  SpO2 97% Patient alert and oriented and in no cardiopulmonary distress.  HEENT: No facial asymmetry, EOMI,   oropharynx pink and moist.  Neck supple no JVD, no mass. No sinus tenderness, TM clear, nasal mucosa erythematous and edematous Chest: Clear  to auscultation bilaterally. Left axilla: tender nodular area on deep palpation, no warmth or erythema, right axilla: no tenderness or nodular lesions palpated CVS: S1, S2 no murmurs, no S3.Regular rate.  ABD: Soft non tender. No organomegaly or mass palpable, normal BS Rectal : no mass, heme negative stool  Ext: No edema  MS: Adequate ROM spine, shoulders, hips and knees.  Skin: Intact, no ulcerations or rash noted.  Psych: Good eye contact, normal affect. Memory intact not anxious or depressed appearing.  CNS: CN 2-12 intact, power,  normal throughout.no focal deficits noted.      Assessment & Plan:  Axillary adenitis 6 month h/o painful left adenitis refer for surgical consult   Liver mass, left lobe C/o feeling fullness when bending forward, abnormal imaging study in 2009 , requires follow up scan   Chronic nonallergic rhinitis Controlled, but due to anatomy of nasal passage, significant difficulty nose breathing   Depression Controlled, no change in medication    HTN (hypertension) Controlled, no change in medication DASH diet and commitment to daily physical activity for a minimum of 30 minutes discussed and encouraged, as a part of hypertension management. The importance of attaining a healthy weight is also discussed.    Asthma Controlled, no change in medication    IGT (impaired glucose tolerance) Patient educated about the importance of limiting  Carbohydrate intake , the need to commit to daily physical activity for a minimum of 30 minutes , and to commit weight loss. The fact that changes in all these areas will  reduce or eliminate all together the development of diabetes is stressed.   Updated lab needed at/ before next visit.

## 2015-02-12 LAB — LIPID PANEL
CHOL/HDL RATIO: 4.1 ratio
CHOLESTEROL: 223 mg/dL — AB (ref 0–200)
HDL: 55 mg/dL (ref >=46)
LDL Cholesterol: 152 mg/dL — ABNORMAL HIGH (ref 0–99)
Triglycerides: 79 mg/dL (ref <150)
VLDL: 16 mg/dL (ref 0–40)

## 2015-02-12 LAB — HIV ANTIBODY (ROUTINE TESTING W REFLEX): HIV 1&2 Ab, 4th Generation: NONREACTIVE

## 2015-02-12 LAB — TSH: TSH: 1.277 u[IU]/mL (ref 0.350–4.500)

## 2015-02-12 LAB — HEMOGLOBIN A1C
Hgb A1c MFr Bld: 5.7 % — ABNORMAL HIGH (ref <5.7)
Mean Plasma Glucose: 117 mg/dL — ABNORMAL HIGH (ref <117)

## 2015-02-13 LAB — VITAMIN D 25 HYDROXY (VIT D DEFICIENCY, FRACTURES): Vit D, 25-Hydroxy: 15 ng/mL — ABNORMAL LOW (ref 30–100)

## 2015-02-15 LAB — COMPREHENSIVE METABOLIC PANEL
ALK PHOS: 67 U/L (ref 39–117)
ALT: 11 U/L (ref 0–35)
AST: 16 U/L (ref 0–37)
Albumin: 4.2 g/dL (ref 3.5–5.2)
BUN: 10 mg/dL (ref 6–23)
CO2: 26 mEq/L (ref 19–32)
CREATININE: 0.75 mg/dL (ref 0.50–1.10)
Calcium: 9.2 mg/dL (ref 8.4–10.5)
Chloride: 103 mEq/L (ref 96–112)
GLUCOSE: 91 mg/dL (ref 70–99)
Potassium: 4.3 mEq/L (ref 3.5–5.3)
Sodium: 140 mEq/L (ref 135–145)
Total Bilirubin: 0.8 mg/dL (ref 0.2–1.2)
Total Protein: 7.1 g/dL (ref 6.0–8.3)

## 2015-02-16 MED ORDER — VITAMIN D (ERGOCALCIFEROL) 1.25 MG (50000 UNIT) PO CAPS: 50000.0000 [IU] | ORAL_CAPSULE | ORAL | Status: DC

## 2015-02-16 NOTE — Addendum Note (Signed)
Addended by: Denman George B on: 02/16/2015 10:27 AM   Modules accepted: Orders

## 2015-02-17 ENCOUNTER — Ambulatory Visit (HOSPITAL_COMMUNITY)
Admission: RE | Admit: 2015-02-17 | Discharge: 2015-02-17 | Disposition: A | Discharge status: Home / Self Care | Payer: BLUE CROSS/BLUE SHIELD | Source: Ambulatory Visit | Attending: Family Medicine | Admitting: Family Medicine

## 2015-02-17 ENCOUNTER — Ambulatory Visit (HOSPITAL_COMMUNITY): Payer: BLUE CROSS/BLUE SHIELD

## 2015-02-17 DIAGNOSIS — Z1231 Encounter for screening mammogram for malignant neoplasm of breast: Secondary | ICD-10-CM | POA: Diagnosis present

## 2015-03-03 ENCOUNTER — Telehealth: Payer: Self-pay | Admitting: Family Medicine

## 2015-03-03 NOTE — Telephone Encounter (Signed)
Noted  

## 2015-03-03 NOTE — Telephone Encounter (Signed)
received the information from insurance company about patient going to se Dr Arnoldo Morale faxed it over and Claiborne Billings from Dr Arnoldo Morale office stated that the patient needs to call the office and she will schedule her an appointment patient is aware of this and will call the office of Dr Arnoldo Morale

## 2015-03-15 ENCOUNTER — Telehealth: Payer: Self-pay | Admitting: Family Medicine

## 2015-03-15 NOTE — Telephone Encounter (Signed)
Pls call and let pt know that her MR abdomen shows lesions approx 1 cm and 0.5 cm which seem to bee small collections of blood (hemangiomas), nothing  Mentioned suggestive of cancer, lungs, bones, lymph nodes all look normal. Nothing needs to be done about hemangiomas

## 2015-03-16 ENCOUNTER — Telehealth: Payer: Self-pay | Admitting: *Deleted

## 2015-03-16 NOTE — Telephone Encounter (Signed)
Pt is returning a call to the nurse. Please advise

## 2015-03-16 NOTE — Telephone Encounter (Signed)
Called patient and left message for them to return call at the office   

## 2015-03-17 NOTE — Telephone Encounter (Signed)
Patient aware.

## 2015-04-02 ENCOUNTER — Encounter: Payer: Self-pay | Admitting: Family Medicine

## 2015-05-12 ENCOUNTER — Telehealth: Payer: Self-pay

## 2015-05-12 DIAGNOSIS — J328 Other chronic sinusitis: Secondary | ICD-10-CM

## 2015-05-12 DIAGNOSIS — J3489 Other specified disorders of nose and nasal sinuses: Secondary | ICD-10-CM

## 2015-05-12 MED ORDER — PREDNISONE 5 MG PO TABS: 5.0000 mg | ORAL_TABLET | Freq: Two times a day (BID) | ORAL | Status: AC

## 2015-05-12 NOTE — Telephone Encounter (Signed)
pls advise 2 to 3 times daily nasal flushes, referral done and med sent

## 2015-05-12 NOTE — Telephone Encounter (Signed)
Reports chronic congestion for months- wants referral to Benjamine Mola (has seen him before but needs referral) thick yellowish mucus when she is able to blow it out. States constantly congested and can't breath out her nose. Has tired everything. Wants rx for prednisone and referral to ENT. Please advise. Uses [facility]

## 2015-05-12 NOTE — Telephone Encounter (Signed)
Pt aware.

## 2015-06-10 ENCOUNTER — Ambulatory Visit (INDEPENDENT_AMBULATORY_CARE_PROVIDER_SITE_OTHER): Payer: BLUE CROSS/BLUE SHIELD | Admitting: Family Medicine

## 2015-06-10 ENCOUNTER — Encounter: Payer: Self-pay | Admitting: Family Medicine

## 2015-06-10 VITALS — BP 134/80 | HR 70 | Resp 18 | Ht 65.0 in | Wt 135.0 lb

## 2015-06-10 DIAGNOSIS — E785 Hyperlipidemia, unspecified: Secondary | ICD-10-CM

## 2015-06-10 DIAGNOSIS — R7302 Impaired glucose tolerance (oral): Secondary | ICD-10-CM | POA: Diagnosis not present

## 2015-06-10 DIAGNOSIS — I10 Essential (primary) hypertension: Secondary | ICD-10-CM

## 2015-06-10 DIAGNOSIS — N951 Menopausal and female climacteric states: Secondary | ICD-10-CM

## 2015-06-10 DIAGNOSIS — J339 Nasal polyp, unspecified: Secondary | ICD-10-CM

## 2015-06-10 DIAGNOSIS — E559 Vitamin D deficiency, unspecified: Secondary | ICD-10-CM

## 2015-06-10 DIAGNOSIS — J4530 Mild persistent asthma, uncomplicated: Secondary | ICD-10-CM

## 2015-06-10 MED ORDER — PAROXETINE HCL 10 MG PO TABS: 10.0000 mg | ORAL_TABLET | Freq: Every day | ORAL | Status: DC

## 2015-06-10 NOTE — Patient Instructions (Addendum)
F/u in 3.5 month, call if you need me before  New for hot flashes is paxil at bedtime also carry out behavioral change discussed  It is important that you exercise regularly at least 30 minutes 7  times a week. If you develop chest pain, have severe difficulty breathing, or feel very tired, stop exercising immediately and seek medical attention    Pls follow low fat diet  Fasting lipid, chem 7 HBA1C, CBC, vit d in 3.5 month  Menopause Menopause is the normal time of life when menstrual periods stop completely. Menopause is complete when you have missed 12 consecutive menstrual periods. It usually occurs between the ages of 19 years and 62 years. Very rarely does a woman develop menopause before the age of 56 years. At menopause, your ovaries stop producing the female hormones estrogen and progesterone. This can cause undesirable symptoms and also affect your health. Sometimes the symptoms may occur 4-5 years before the menopause begins. There is no relationship between menopause and:  Oral contraceptives.  Number of children you had.  Race.  The age your menstrual periods started (menarche). Heavy smokers and very thin women may develop menopause earlier in life. CAUSES  The ovaries stop producing the female hormones estrogen and progesterone.  Other causes include:  Surgery to remove both ovaries.  The ovaries stop functioning for no known reason.  Tumors of the pituitary gland in the brain.  Medical disease that affects the ovaries and hormone production.  Radiation treatment to the abdomen or pelvis.  Chemotherapy that affects the ovaries. SYMPTOMS   Hot flashes.  Night sweats.  Decrease in sex drive.  Vaginal dryness and thinning of the vagina causing painful intercourse.  Dryness of the skin and developing wrinkles.  Headaches.  Tiredness.  Irritability.  Memory problems.  Weight gain.  Bladder infections.  Hair growth of the face and  chest.  Infertility. More serious symptoms include:  Loss of bone (osteoporosis) causing breaks (fractures).  Depression.  Hardening and narrowing of the arteries (atherosclerosis) causing heart attacks and strokes. DIAGNOSIS   When the menstrual periods have stopped for 12 straight months.  Physical exam.  Hormone studies of the blood. TREATMENT  There are many treatment choices and nearly as many questions about them. The decisions to treat or not to treat menopausal changes is an individual choice made with your health care provider. Your health care provider can discuss the treatments with you. Together, you can decide which treatment will work best for you. Your treatment choices may include:   Hormone therapy (estrogen and progesterone).  Non-hormonal medicines.  Treating the individual symptoms with medicine (for example antidepressants for depression).  Herbal medicines that may help specific symptoms.  Counseling by a psychiatrist or psychologist.  Group therapy.  Lifestyle changes including:  Eating healthy.  Regular exercise.  Limiting caffeine and alcohol.  Stress management and meditation.  No treatment. HOME CARE INSTRUCTIONS   Take the medicine your health care provider gives you as directed.  Get plenty of sleep and rest.  Exercise regularly.  Eat a diet that contains calcium (good for the bones) and soy products (acts like estrogen hormone).  Avoid alcoholic beverages.  Do not smoke.  If you have hot flashes, dress in layers.  Take supplements, calcium, and vitamin D to strengthen bones.  You can use over-the-counter lubricants or moisturizers for vaginal dryness.  Group therapy is sometimes very helpful.  Acupuncture may be helpful in some cases. SEEK MEDICAL CARE IF:  You are not sure you are in menopause.  You are having menopausal symptoms and need advice and treatment.  You are still having menstrual periods after age 86  years.  You have pain with intercourse.  Menopause is complete (no menstrual period for 12 months) and you develop vaginal bleeding.  You need a referral to a specialist (gynecologist, psychiatrist, or psychologist) for treatment. SEEK IMMEDIATE MEDICAL CARE IF:   You have severe depression.  You have excessive vaginal bleeding.  You fell and think you have a broken bone.  You have pain when you urinate.  You develop leg or chest pain.  You have a fast pounding heart beat (palpitations).  You have severe headaches.  You develop vision problems.  You feel a lump in your breast.  You have abdominal pain or severe indigestion. Document Released: 02/10/2004 Document Revised: 07/23/2013 Document Reviewed: 06/19/2013 [employer] Patient Information 2015 [place], [place]. This information is not intended to replace advice given to you by your health care provider. Make sure you discuss any questions you have with your health care provider.

## 2015-06-13 NOTE — Assessment & Plan Note (Signed)
Has appt with ENT in the next several weeks with the hope that she will be approved for necessary surgery

## 2015-06-13 NOTE — Progress Notes (Signed)
Hannah Hays     MRN: [id number]      DOB: 08-01-1966   HPI Hannah Hays is here for follow up and re-evaluation of chronic medical conditions, medication management and review of any available recent lab and radiology data.  Preventive health is updated, specifically  Cancer screening and Immunization.   Questions or concerns regarding consultations or procedures which the PT has had in the interim are  addressed. The PT denies any adverse reactions to current medications since the last visit.  C/o increased and uncontrolled menopause symptoms of hot flashes wants medication to help with this Has upcoming ENT re eval for excessive nasal obstruction, hopes to be approved for surgery  ROS Denies recent fever or chills. Denies sinus pressure, nasal congestion, ear pain or sore throat. Denies chest congestion, productive cough or wheezing. Denies chest pains, palpitations and leg swelling Denies abdominal pain, nausea, vomiting,diarrhea or constipation.   Denies dysuria, frequency, hesitancy or incontinence. Denies joint pain, swelling and limitation in mobility. Denies headaches, seizures, numbness, or tingling. Denies depression, anxiety or insomnia. Denies skin break down or rash.   PE  BP 134/80 mmHg  Pulse 70  Resp 18  Ht 5\' 5"  (1.651 m)  Wt 135 lb 0.6 oz (61.254 kg)  BMI 22.47 kg/m2  SpO2 98%  Patient alert and oriented and in no cardiopulmonary distress.  HEENT: No facial asymmetry, EOMI,   oropharynx pink and moist.  Neck supple no JVD, no mass.  Chest: Clear to auscultation bilaterally.  CVS: S1, S2 no murmurs, no S3.Regular rate.  ABD: Soft non tender.   Ext: No edema  MS: Adequate ROM spine, shoulders, hips and knees.  Skin: Intact, no ulcerations or rash noted.  Psych: Good eye contact, normal affect. Memory intact not anxious or depressed appearing.  CNS: CN 2-12 intact, power,  normal throughout.no focal deficits noted.   Assessment & Plan   Hot  flashes, menopausal Start paxil, and lifestyle changes discussed to facilitate  Dyslipidemia Hyperlipidemia:Low fat diet discussed and encouraged.   Lipid Panel  Lab Results  Component Value Date   CHOL 223* 02/11/2015   HDL 55 02/11/2015   LDLCALC 152* 02/11/2015   TRIG 79 02/11/2015   CHOLHDL 4.1 02/11/2015   Reduce fried and fatty food Updated lab needed at/ before next visit.     IGT (impaired glucose tolerance) Patient educated about the importance of limiting  Carbohydrate intake , the need to commit to daily physical activity for a minimum of 30 minutes , and to commit weight loss. The fact that changes in all these areas will reduce or eliminate all together the development of diabetes is stressed. Updated lab needed at/ before next visit.    Diabetic Labs Latest Ref Rng 02/11/2015 03/03/2014 07/10/2013 03/10/2013 01/10/2013  HbA1c <5.7 % 5.7(H) - 5.3 - 5.8(H)  Chol 0 - 200 mg/dL 223(H) 220(H) - - 182  HDL >=46 mg/dL 55 59 - - 53  Calc LDL 0 - 99 mg/dL 152(H) 150(H) - - 118(H)  Triglycerides <150 mg/dL 79 53 - - 57  Creatinine 0.50 - 1.10 mg/dL 0.75 0.72 0.84 0.74 0.80   BP/Weight 06/10/2015 02/11/2015 08/20/2014 03/05/2014 12/18/2013 07/08/4626 0/02/5008  Systolic BP 381 829 937 169 678 938 101  Diastolic BP 80 84 82 84 70 80 80  Wt. (Lbs) 135.04 133.12 135 133 126 118.12 119.12  BMI 22.47 22.15 22.47 22.13 20.97 19.66 19.37   No flowsheet data found.     Asthma Controlled,  no change in medication   HTN (hypertension) Controlled, no change in medication DASH diet and commitment to daily physical activity for a minimum of 30 minutes discussed and encouraged, as a part of hypertension management. The importance of attaining a healthy weight is also discussed.  BP/Weight 06/10/2015 02/11/2015 08/20/2014 03/05/2014 12/18/2013 03/04/300 02/02/4387  Systolic BP 875 797 282 060 156 153 794  Diastolic BP 80 84 82 84 70 80 80  Wt. (Lbs) 135.04 133.12 135 133 126 118.12 119.12  BMI  22.47 22.15 22.47 22.13 20.97 19.66 19.37        Nasal polyposis Has appt with ENT in the next several weeks with the hope that she will be approved for necessary surgery

## 2015-06-13 NOTE — Assessment & Plan Note (Signed)
Controlled, no change in medication DASH diet and commitment to daily physical activity for a minimum of 30 minutes discussed and encouraged, as a part of hypertension management. The importance of attaining a healthy weight is also discussed.  BP/Weight 06/10/2015 02/11/2015 08/20/2014 03/05/2014 12/18/2013 05/08/4649 02/06/4655  Systolic BP 812 751 700 174 944 967 591  Diastolic BP 80 84 82 84 70 80 80  Wt. (Lbs) 135.04 133.12 135 133 126 118.12 119.12  BMI 22.47 22.15 22.47 22.13 20.97 19.66 19.37

## 2015-06-13 NOTE — Assessment & Plan Note (Signed)
Controlled, no change in medication  

## 2015-06-13 NOTE — Assessment & Plan Note (Signed)
Start paxil, and lifestyle changes discussed to facilitate

## 2015-06-13 NOTE — Assessment & Plan Note (Addendum)
Patient educated about the importance of limiting  Carbohydrate intake , the need to commit to daily physical activity for a minimum of 30 minutes , and to commit weight loss. The fact that changes in all these areas will reduce or eliminate all together the development of diabetes is stressed. Updated lab needed at/ before next visit.    Diabetic Labs Latest Ref Rng 02/11/2015 03/03/2014 07/10/2013 03/10/2013 01/10/2013  HbA1c <5.7 % 5.7(H) - 5.3 - 5.8(H)  Chol 0 - 200 mg/dL 223(H) 220(H) - - 182  HDL >=46 mg/dL 55 59 - - 53  Calc LDL 0 - 99 mg/dL 152(H) 150(H) - - 118(H)  Triglycerides <150 mg/dL 79 53 - - 57  Creatinine 0.50 - 1.10 mg/dL 0.75 0.72 0.84 0.74 0.80   BP/Weight 06/10/2015 02/11/2015 08/20/2014 03/05/2014 12/18/2013 06/05/5328 08/06/4267  Systolic BP 341 962 229 798 921 194 174  Diastolic BP 80 84 82 84 70 80 80  Wt. (Lbs) 135.04 133.12 135 133 126 118.12 119.12  BMI 22.47 22.15 22.47 22.13 20.97 19.66 19.37   No flowsheet data found.

## 2015-06-13 NOTE — Assessment & Plan Note (Signed)
Hyperlipidemia:Low fat diet discussed and encouraged.   Lipid Panel  Lab Results  Component Value Date   CHOL 223* 02/11/2015   HDL 55 02/11/2015   LDLCALC 152* 02/11/2015   TRIG 79 02/11/2015   CHOLHDL 4.1 02/11/2015   Reduce fried and fatty food Updated lab needed at/ before next visit.

## 2015-06-24 ENCOUNTER — Ambulatory Visit (INDEPENDENT_AMBULATORY_CARE_PROVIDER_SITE_OTHER): Payer: BLUE CROSS/BLUE SHIELD | Admitting: Otolaryngology

## 2015-06-24 DIAGNOSIS — J330 Polyp of nasal cavity: Secondary | ICD-10-CM

## 2015-07-15 ENCOUNTER — Ambulatory Visit (INDEPENDENT_AMBULATORY_CARE_PROVIDER_SITE_OTHER): Payer: BLUE CROSS/BLUE SHIELD | Admitting: Otolaryngology

## 2015-07-15 DIAGNOSIS — J330 Polyp of nasal cavity: Secondary | ICD-10-CM

## 2015-07-15 DIAGNOSIS — J320 Chronic maxillary sinusitis: Secondary | ICD-10-CM | POA: Diagnosis not present

## 2015-07-15 DIAGNOSIS — J322 Chronic ethmoidal sinusitis: Secondary | ICD-10-CM

## 2015-07-15 DIAGNOSIS — J321 Chronic frontal sinusitis: Secondary | ICD-10-CM

## 2015-07-20 ENCOUNTER — Other Ambulatory Visit: Payer: Self-pay | Admitting: Otolaryngology

## 2015-07-20 ENCOUNTER — Other Ambulatory Visit: Payer: Self-pay | Admitting: Family Medicine

## 2015-08-10 ENCOUNTER — Encounter (HOSPITAL_BASED_OUTPATIENT_CLINIC_OR_DEPARTMENT_OTHER): Payer: Self-pay | Admitting: *Deleted

## 2015-08-12 ENCOUNTER — Encounter (HOSPITAL_BASED_OUTPATIENT_CLINIC_OR_DEPARTMENT_OTHER)
Admission: RE | Admit: 2015-08-12 | Discharge: 2015-08-12 | Disposition: A | Discharge status: Home / Self Care | Payer: BLUE CROSS/BLUE SHIELD | Source: Ambulatory Visit | Attending: Otolaryngology | Admitting: Otolaryngology

## 2015-08-12 ENCOUNTER — Other Ambulatory Visit: Payer: Self-pay

## 2015-08-12 DIAGNOSIS — Z888 Allergy status to other drugs, medicaments and biological substances status: Secondary | ICD-10-CM | POA: Diagnosis not present

## 2015-08-12 DIAGNOSIS — K219 Gastro-esophageal reflux disease without esophagitis: Secondary | ICD-10-CM | POA: Diagnosis not present

## 2015-08-12 DIAGNOSIS — J45909 Unspecified asthma, uncomplicated: Secondary | ICD-10-CM | POA: Diagnosis not present

## 2015-08-12 DIAGNOSIS — I10 Essential (primary) hypertension: Secondary | ICD-10-CM | POA: Diagnosis not present

## 2015-08-12 DIAGNOSIS — J342 Deviated nasal septum: Secondary | ICD-10-CM | POA: Diagnosis not present

## 2015-08-12 DIAGNOSIS — J338 Other polyp of sinus: Secondary | ICD-10-CM | POA: Diagnosis not present

## 2015-08-12 DIAGNOSIS — J3489 Other specified disorders of nose and nasal sinuses: Secondary | ICD-10-CM | POA: Diagnosis not present

## 2015-08-12 DIAGNOSIS — J324 Chronic pansinusitis: Secondary | ICD-10-CM | POA: Diagnosis present

## 2015-08-12 LAB — BASIC METABOLIC PANEL
Anion gap: 5 (ref 5–15)
BUN: 9 mg/dL (ref 6–20)
CALCIUM: 9 mg/dL (ref 8.9–10.3)
CO2: 28 mmol/L (ref 22–32)
Chloride: 105 mmol/L (ref 101–111)
Creatinine, Ser: 0.71 mg/dL (ref 0.44–1.00)
GFR calc Af Amer: >60 mL/min (ref >60)
GLUCOSE: 99 mg/dL (ref 65–99)
Potassium: 4.2 mmol/L (ref 3.5–5.1)
SODIUM: 138 mmol/L (ref 135–145)

## 2015-08-16 ENCOUNTER — Ambulatory Visit (HOSPITAL_BASED_OUTPATIENT_CLINIC_OR_DEPARTMENT_OTHER)
Admission: RE | Admit: 2015-08-16 | Discharge: 2015-08-16 | Disposition: A | Discharge status: Home / Self Care | Payer: BLUE CROSS/BLUE SHIELD | Source: Ambulatory Visit | Attending: Otolaryngology | Admitting: Otolaryngology

## 2015-08-16 ENCOUNTER — Ambulatory Visit (HOSPITAL_BASED_OUTPATIENT_CLINIC_OR_DEPARTMENT_OTHER): Payer: BLUE CROSS/BLUE SHIELD | Admitting: Anesthesiology

## 2015-08-16 ENCOUNTER — Encounter (HOSPITAL_BASED_OUTPATIENT_CLINIC_OR_DEPARTMENT_OTHER)
Admission: RE | Disposition: A | Discharge status: Home / Self Care | Payer: Self-pay | Source: Ambulatory Visit | Attending: Otolaryngology

## 2015-08-16 ENCOUNTER — Encounter (HOSPITAL_BASED_OUTPATIENT_CLINIC_OR_DEPARTMENT_OTHER): Payer: Self-pay | Admitting: Anesthesiology

## 2015-08-16 DIAGNOSIS — J45909 Unspecified asthma, uncomplicated: Secondary | ICD-10-CM | POA: Insufficient documentation

## 2015-08-16 DIAGNOSIS — J338 Other polyp of sinus: Secondary | ICD-10-CM | POA: Diagnosis not present

## 2015-08-16 DIAGNOSIS — J3489 Other specified disorders of nose and nasal sinuses: Secondary | ICD-10-CM | POA: Insufficient documentation

## 2015-08-16 DIAGNOSIS — I10 Essential (primary) hypertension: Secondary | ICD-10-CM | POA: Insufficient documentation

## 2015-08-16 DIAGNOSIS — J324 Chronic pansinusitis: Secondary | ICD-10-CM | POA: Insufficient documentation

## 2015-08-16 DIAGNOSIS — K219 Gastro-esophageal reflux disease without esophagitis: Secondary | ICD-10-CM | POA: Insufficient documentation

## 2015-08-16 DIAGNOSIS — J342 Deviated nasal septum: Secondary | ICD-10-CM | POA: Diagnosis not present

## 2015-08-16 DIAGNOSIS — Z888 Allergy status to other drugs, medicaments and biological substances status: Secondary | ICD-10-CM | POA: Insufficient documentation

## 2015-08-16 DIAGNOSIS — J320 Chronic maxillary sinusitis: Secondary | ICD-10-CM | POA: Diagnosis not present

## 2015-08-16 DIAGNOSIS — J321 Chronic frontal sinusitis: Secondary | ICD-10-CM | POA: Diagnosis not present

## 2015-08-16 DIAGNOSIS — J323 Chronic sphenoidal sinusitis: Secondary | ICD-10-CM | POA: Diagnosis not present

## 2015-08-16 DIAGNOSIS — J322 Chronic ethmoidal sinusitis: Secondary | ICD-10-CM | POA: Diagnosis not present

## 2015-08-16 HISTORY — PX: SEPTOPLASTY: SHX2393

## 2015-08-16 HISTORY — PX: SINUS ENDO WITH FUSION: SHX5329

## 2015-08-16 HISTORY — DX: Depression, unspecified: F32.A

## 2015-08-16 HISTORY — DX: Major depressive disorder, single episode, unspecified: F32.9

## 2015-08-16 HISTORY — DX: Anxiety disorder, unspecified: F41.9

## 2015-08-16 LAB — POCT HEMOGLOBIN-HEMACUE: HEMOGLOBIN: 14.7 g/dL (ref 12.0–15.0)

## 2015-08-16 SURGERY — SURGERY, PARANASAL SINUS, ENDOSCOPIC, WITH NASAL SEPTOPLASTY, TURBINOPLASTY, AND MAXILLARY SINUSOTOMY
Anesthesia: General | Site: Nose

## 2015-08-16 MED ORDER — HYDROMORPHONE HCL 1 MG/ML IJ SOLN
INTRAMUSCULAR | Status: AC
  Filled 2015-08-16: qty 1

## 2015-08-16 MED ORDER — LIDOCAINE HCL (CARDIAC) 20 MG/ML IV SOLN
INTRAVENOUS | Status: DC | PRN
  Administered 2015-08-16: 100 mg via INTRAVENOUS

## 2015-08-16 MED ORDER — LACTATED RINGERS IV SOLN
INTRAVENOUS | Status: DC
  Administered 2015-08-16 (×2): via INTRAVENOUS

## 2015-08-16 MED ORDER — GLYCOPYRROLATE 0.2 MG/ML IJ SOLN: 0.2000 mg | Freq: Once | INTRAMUSCULAR | Status: DC | PRN

## 2015-08-16 MED ORDER — ONDANSETRON HCL 4 MG/2ML IJ SOLN
INTRAMUSCULAR | Status: DC | PRN
  Administered 2015-08-16: 4 mg via INTRAVENOUS

## 2015-08-16 MED ORDER — MEPERIDINE HCL 25 MG/ML IJ SOLN: 6.2500 mg | INTRAMUSCULAR | Status: DC | PRN

## 2015-08-16 MED ORDER — EPHEDRINE SULFATE 50 MG/ML IJ SOLN
INTRAMUSCULAR | Status: DC | PRN
  Administered 2015-08-16 (×2): 10 mg via INTRAVENOUS

## 2015-08-16 MED ORDER — EPHEDRINE SULFATE 50 MG/ML IJ SOLN
INTRAMUSCULAR | Status: AC
  Filled 2015-08-16: qty 1

## 2015-08-16 MED ORDER — HYDROMORPHONE HCL 1 MG/ML IJ SOLN
0.2500 mg | INTRAMUSCULAR | Status: DC | PRN
  Administered 2015-08-16 (×3): 0.5 mg via INTRAVENOUS

## 2015-08-16 MED ORDER — BACITRACIN ZINC 500 UNIT/GM EX OINT
TOPICAL_OINTMENT | CUTANEOUS | Status: AC
  Filled 2015-08-16: qty 28.35

## 2015-08-16 MED ORDER — SUCCINYLCHOLINE CHLORIDE 20 MG/ML IJ SOLN
INTRAMUSCULAR | Status: AC
  Filled 2015-08-16: qty 1

## 2015-08-16 MED ORDER — LIDOCAINE-EPINEPHRINE 1 %-1:100000 IJ SOLN
INTRAMUSCULAR | Status: DC | PRN
  Administered 2015-08-16: 1 mL

## 2015-08-16 MED ORDER — OXYCODONE HCL 5 MG PO TABS: 5.0000 mg | ORAL_TABLET | Freq: Once | ORAL | Status: DC | PRN

## 2015-08-16 MED ORDER — SODIUM CHLORIDE 0.9 % IV SOLN
INTRAVENOUS | Status: DC | PRN
  Administered 2015-08-16: 100 mL

## 2015-08-16 MED ORDER — OXYMETAZOLINE HCL 0.05 % NA SOLN
NASAL | Status: DC | PRN
  Administered 2015-08-16: 1 via TOPICAL

## 2015-08-16 MED ORDER — DEXAMETHASONE SODIUM PHOSPHATE 4 MG/ML IJ SOLN
INTRAMUSCULAR | Status: DC | PRN
  Administered 2015-08-16: 10 mg via INTRAVENOUS

## 2015-08-16 MED ORDER — OXYMETAZOLINE HCL 0.05 % NA SOLN
NASAL | Status: AC
  Filled 2015-08-16: qty 15

## 2015-08-16 MED ORDER — OXYCODONE-ACETAMINOPHEN 5-325 MG PO TABS: 1.0000 | ORAL_TABLET | ORAL | Status: DC | PRN

## 2015-08-16 MED ORDER — OXYCODONE HCL 5 MG/5ML PO SOLN: 5.0000 mg | Freq: Once | ORAL | Status: DC | PRN

## 2015-08-16 MED ORDER — MUPIROCIN 2 % EX OINT
TOPICAL_OINTMENT | CUTANEOUS | Status: DC | PRN
  Administered 2015-08-16: 1 via NASAL

## 2015-08-16 MED ORDER — MIDAZOLAM HCL 2 MG/2ML IJ SOLN
1.0000 mg | INTRAMUSCULAR | Status: DC | PRN
Start: 2015-08-16 — End: 2015-08-16
  Administered 2015-08-16: 2 mg via INTRAVENOUS

## 2015-08-16 MED ORDER — ONDANSETRON HCL 4 MG/2ML IJ SOLN
INTRAMUSCULAR | Status: AC
  Filled 2015-08-16: qty 2

## 2015-08-16 MED ORDER — DEXAMETHASONE SODIUM PHOSPHATE 10 MG/ML IJ SOLN
INTRAMUSCULAR | Status: AC
  Filled 2015-08-16: qty 1

## 2015-08-16 MED ORDER — SUCCINYLCHOLINE CHLORIDE 20 MG/ML IJ SOLN
INTRAMUSCULAR | Status: DC | PRN
  Administered 2015-08-16: 100 mg via INTRAVENOUS

## 2015-08-16 MED ORDER — PROPOFOL 10 MG/ML IV BOLUS
INTRAVENOUS | Status: DC | PRN
  Administered 2015-08-16: 200 mg via INTRAVENOUS

## 2015-08-16 MED ORDER — MUPIROCIN 2 % EX OINT
TOPICAL_OINTMENT | CUTANEOUS | Status: AC
  Filled 2015-08-16: qty 22

## 2015-08-16 MED ORDER — FENTANYL CITRATE (PF) 100 MCG/2ML IJ SOLN
50.0000 ug | INTRAMUSCULAR | Status: DC | PRN
  Administered 2015-08-16: 100 ug via INTRAVENOUS

## 2015-08-16 MED ORDER — SCOPOLAMINE 1 MG/3DAYS TD PT72
MEDICATED_PATCH | TRANSDERMAL | Status: AC
  Filled 2015-08-16: qty 1

## 2015-08-16 MED ORDER — LIDOCAINE HCL (CARDIAC) 20 MG/ML IV SOLN
INTRAVENOUS | Status: AC
  Filled 2015-08-16: qty 5

## 2015-08-16 MED ORDER — LIDOCAINE-EPINEPHRINE 1 %-1:100000 IJ SOLN
INTRAMUSCULAR | Status: AC
  Filled 2015-08-16: qty 1

## 2015-08-16 MED ORDER — SCOPOLAMINE 1 MG/3DAYS TD PT72
1.0000 | MEDICATED_PATCH | Freq: Once | TRANSDERMAL | Status: DC | PRN
  Administered 2015-08-16: 1.5 mg via TRANSDERMAL

## 2015-08-16 MED ORDER — AMOXICILLIN 875 MG PO TABS: 875.0000 mg | ORAL_TABLET | Freq: Two times a day (BID) | ORAL | Status: DC

## 2015-08-16 MED ORDER — PROPOFOL 10 MG/ML IV BOLUS
INTRAVENOUS | Status: AC
  Filled 2015-08-16: qty 20

## 2015-08-16 MED ORDER — MIDAZOLAM HCL 2 MG/2ML IJ SOLN
INTRAMUSCULAR | Status: AC
  Filled 2015-08-16: qty 4

## 2015-08-16 MED ORDER — FENTANYL CITRATE (PF) 100 MCG/2ML IJ SOLN
INTRAMUSCULAR | Status: AC
  Filled 2015-08-16: qty 4

## 2015-08-16 SURGICAL SUPPLY — 59 items
ATTRACTOMAT 16X20 MAGNETIC DRP (DRAPES) IMPLANT
BLADE ROTATE RAD 12 4 M4 (BLADE) IMPLANT
BLADE ROTATE RAD 40 4 M4 (BLADE) IMPLANT
BLADE ROTATE TRICUT 4X13 M4 (BLADE) ×3 IMPLANT
BLADE SURG 15 STRL LF DISP TIS (BLADE) IMPLANT
BLADE SURG 15 STRL SS (BLADE)
BLADE TRICUT ROTATE M4 4 5PK (BLADE) IMPLANT
BUR HS RAD FRONTAL 3 (BURR) IMPLANT
CANISTER SUC SOCK COL 7IN (MISCELLANEOUS) ×6 IMPLANT
CANISTER SUCT 1200ML W/VALVE (MISCELLANEOUS) ×3 IMPLANT
COAGULATOR SUCT 8FR VV (MISCELLANEOUS) ×3 IMPLANT
DECANTER SPIKE VIAL GLASS SM (MISCELLANEOUS) IMPLANT
DRSG NASAL KENNEDY LMNT 8CM (GAUZE/BANDAGES/DRESSINGS) IMPLANT
DRSG NASOPORE 8CM (GAUZE/BANDAGES/DRESSINGS) ×1 IMPLANT
DRSG TELFA 3X8 NADH (GAUZE/BANDAGES/DRESSINGS) IMPLANT
ELECT REM PT RETURN 9FT ADLT (ELECTROSURGICAL) ×3
ELECTRODE REM PT RTRN 9FT ADLT (ELECTROSURGICAL) ×2 IMPLANT
GLOVE BIO SURGEON STRL SZ7.5 (GLOVE) ×3 IMPLANT
GLOVE BIOGEL PI IND STRL 7.0 (GLOVE) IMPLANT
GLOVE BIOGEL PI INDICATOR 7.0 (GLOVE) ×1
GLOVE ECLIPSE 6.5 STRL STRAW (GLOVE) ×1 IMPLANT
GLOVE SURG SS PI 7.0 STRL IVOR (GLOVE) ×1 IMPLANT
GOWN STRL REUS W/ TWL LRG LVL3 (GOWN DISPOSABLE) ×4 IMPLANT
GOWN STRL REUS W/TWL LRG LVL3 (GOWN DISPOSABLE) ×9
HEMOSTAT SURGICEL 2X14 (HEMOSTASIS) IMPLANT
IV NS 1000ML (IV SOLUTION)
IV NS 1000ML BAXH (IV SOLUTION) IMPLANT
IV NS 500ML (IV SOLUTION) ×3
IV NS 500ML BAXH (IV SOLUTION) ×4 IMPLANT
NDL HYPO 25X1 1.5 SAFETY (NEEDLE) ×2 IMPLANT
NDL PRECISIONGLIDE 27X1.5 (NEEDLE) ×2 IMPLANT
NDL SPNL 25GX3.5 QUINCKE BL (NEEDLE) IMPLANT
NEEDLE HYPO 25X1 1.5 SAFETY (NEEDLE) ×3 IMPLANT
NEEDLE PRECISIONGLIDE 27X1.5 (NEEDLE) ×3 IMPLANT
NEEDLE SPNL 25GX3.5 QUINCKE BL (NEEDLE) IMPLANT
NS IRRIG 1000ML POUR BTL (IV SOLUTION) ×1 IMPLANT
PACK BASIN DAY SURGERY FS (CUSTOM PROCEDURE TRAY) ×3 IMPLANT
PACK ENT DAY SURGERY (CUSTOM PROCEDURE TRAY) ×3 IMPLANT
PAD DRESSING TELFA 3X8 NADH (GAUZE/BANDAGES/DRESSINGS) IMPLANT
PAD ENT ADHESIVE 25PK (MISCELLANEOUS) ×2 IMPLANT
SLEEVE SCD COMPRESS KNEE MED (MISCELLANEOUS) ×1 IMPLANT
SOLUTION BUTLER CLEAR DIP (MISCELLANEOUS) ×5 IMPLANT
SPLINT NASAL AIRWAY SILICONE (MISCELLANEOUS) ×1 IMPLANT
SPONGE GAUZE 2X2 8PLY STRL LF (GAUZE/BANDAGES/DRESSINGS) ×3 IMPLANT
SPONGE NEURO XRAY DETECT 1X3 (DISPOSABLE) ×3 IMPLANT
SUT CHROMIC 4 0 P 3 18 (SUTURE) ×3 IMPLANT
SUT ETHILON 3 0 PS 1 (SUTURE) IMPLANT
SUT PLAIN 4 0 ~~LOC~~ 1 (SUTURE) ×3 IMPLANT
SUT PROLENE 3 0 PS 2 (SUTURE) ×1 IMPLANT
SUT VIC AB 4-0 P-3 18XBRD (SUTURE) IMPLANT
SUT VIC AB 4-0 P3 18 (SUTURE) ×3
TOWEL OR 17X24 6PK STRL BLUE (TOWEL DISPOSABLE) ×3 IMPLANT
TRACKER ENT INSTRUMENT (MISCELLANEOUS) ×3 IMPLANT
TRACKER ENT PATIENT (MISCELLANEOUS) ×3 IMPLANT
TUBE CONNECTING 20X1/4 (TUBING) ×3 IMPLANT
TUBE SALEM SUMP 12R W/ARV (TUBING) IMPLANT
TUBE SALEM SUMP 16 FR W/ARV (TUBING) ×2 IMPLANT
TUBING STRAIGHTSHOT EPS 5PK (TUBING) ×3 IMPLANT
YANKAUER SUCT BULB TIP NO VENT (SUCTIONS) ×2 IMPLANT

## 2015-08-16 NOTE — H&P (Signed)
Cc: Chronic sinusitis, bilateral nasal polyposis  HPI: The patient returns today for a follow up evaluation. She has a history of bilateral chronic rhinosinusitis and bilateral nasal polyposis.  She was last seen more than 2 years ago. She was treated with steroids nasal sprays and systemic steroids. She was subsequently lost to follow-up. She returned 2 weeks ago, complaining of significant nasal obstruction. She continues to use her nasal sprays without relief. She was treated with 2 additional courses of systemic steroids. The patient was previously seen by allergist, Dr. Orinda Kenner.  She was treated with multiple allergy medications, including anti-histamine and decongestant. She continues to be symptomatic. Her sinus CT scan showed near complete opacification of all paranasal sinuses. She also has bidirectional nasal septal deviation.  Exam: The nasal cavities were decongested and anesthetised with a combination of oxymetazoline and 4% lidocaine solution. The flexible scope was inserted into the right nasal cavity. Bidirectional nasoseptal deviation was noted. Endoscopy of the inferior and middle meatus was performed, demonstrating moderately congested nasal mucosa. Large polyps were noted at bilaterally, completely obstructing both nasal passageways. Turbinates were hypertrophied but without mass. The procedure was repeated on the contralateral side with similar findings. The patient tolerated the procedure well. Instructions were given to avoid eating or drinking for 2 hours.   Assessment:  1. Chronic rhinosinusitis and bilateral nasal polyposis. Large polyps are noted to completely obstruct both nasal passageways.  2. Bidirectional nasoseptal deviation.  3. The patient's CT scan shows complete opacification of all 4 pairs of paranasal sinuses.  Plan: 1. The endoscopy findings and CT results are extensively discussed with the patient 2. Continue nasal saline irrigation and Veramyst as  prescribed.  3. In light of her persistent symptoms, she will likely benefit from undergoing bilateral endoscopic sinus surgery and septoplasty to treat her persistent nasal obstruction and polyposis. 4. The risks, benefits, alternatives, and details of the procedure are reviewed with the patient. Questions are invited and answered.

## 2015-08-16 NOTE — Discharge Instructions (Addendum)

## 2015-08-16 NOTE — Op Note (Signed)
DATE OF PROCEDURE:  08/16/2015                              OPERATIVE REPORT  SURGEON:  Leta Baptist, MD  PREOPERATIVE DIAGNOSES: 1. Bilateral chronic pansinusitis 2. Bilateral sinonasal polyps, involving bilateral frontal, ethmoid, sphenoid, and maxillary sinuses 3. Nasal septal deviation  POSTOPERATIVE DIAGNOSES: 1. Bilateral chronic pansinusitis 2. Bilateral sinonasal polyps, involving bilateral frontal, ethmoid, sphenoid, and maxillary sinuses 3. Nasal septal deviation  PROCEDURE PERFORMED:   1. Bilateral endoscopic frontal sinusotomy with polyp removal 2. Septoplasty 3. Bilateral endoscopic total ethmoidectomy 4. Bilateral endoscopic maxillary antrostomy with polyp removal 5. Bilateral endoscopic sphenoidectomy with polyp removal  6.  FUSION stereotactic image guidance   ANESTHESIA:  General endotracheal tube anesthesia.  COMPLICATIONS:  None.  ESTIMATED BLOOD LOSS:  100 ml  INDICATION FOR PROCEDURE:  Hannah Hays is a 49 y.o. female with a history of bilateral chronic rhinosinusitis and nasal obstruction. On examination, the patient was noted to have large bilateral sinonasal polyps, completely obstructing the nasal passageways.  She was treated with multiple courses of antibiotics, systemic and topical steroids, anti-histamine, and decongestant. However, she continued to be symptomatic. On her CT scan, she was noted to have complete opacification of her maxillary, ethmoid, sphenoid, and frontal sinuses bilaterally. Her nasal cavities were also completely filled with polypoid tissue. Based on the above findings, the decision was made for the patient to proceed with the above-stated procedures. Likelihood of success in reducing symptoms was also discussed.  The risks, benefits, alternatives, and details of the procedures were discussed with the patient.  Questions were invited and answered.  Informed consent was obtained.  DESCRIPTION:  The patient was taken to the operating room and  placed supine on the operating table.  General endotracheal tube anesthesia was administered by the anesthesiologist.  The patient was positioned and prepped and draped in a standard fashion for sinonasal surgery. Pledgets soaked with Afrin were placed in both nasal cavities for vasoconstriction. The pledgets were subsequently removed. 1% lidocaine with 1-100,000 epinephrine was infiltrated onto the nasal septum and lateral nasal walls.  FUSION stereotactic image guidance marker was placed. The image guidance system was functional throughout the case. Attention was first focused on the left nasal cavity. The left nasal cavity was noted to be completely filled with polypoid tissue. Under the guidance of a 0 scope, the polypoid tissue was removed using a combination of Blakesley forceps, microdebrider, and Tru-Cut forceps. The middle turbinate was then carefully medialized. A large amount of polypoid tissue was removed from the middle meatus. The uncinate process was resected. The maxillary antrum was entered and enlarged using a combination of Tru-Cut forceps, backbiter, and microdebrider. Large amount of polypoid tissue was removed from the left maxillary sinus. The anterior and ethmoid cavities were subsequently entered. Polypoid tissue was also removed from the ethmoid sinuses. The anterior wall of the left sphenoid sinus was resected. A large amount of polypoid tissue was removed from the left sphenoid sinus. Attention was then focused on the frontal recess. The left  frontal recess was enlarged. Polypoid tissue and mucoid drainage were removed from the left frontal sinus.  All the sinuses were copiously irrigated.  The same procedure was repeated on the right side without exception. It should be noted that the right middle turbinate was completely eroded by the polypoid tissue.  The specimens from all sinuses was sent to the pathology department for permanent  histologic identification.  Attention was then  focused on the septum. A standard hemitransfixion incision was made on the left nasal septum. The left submucosal flap was elevated in a standard fashion. A cartilaginous incision was made 1 cm superior to the caudal margin of the septum. The contralateral mucosal flap was also elevated. The deviated portion of the cartilage and bony nasal septum were removed. The cartilage was morselized and replaced. The septum was quilted with 40 plain gut sutures. The hemitransfixion incision was closed with interrupted chromic sutures. Doyle splints were applied to the nasal septum bilaterally.  The care of the patient was turned over to the anesthesiologist.  The patient was awakened from anesthesia without difficulty.  She was extubated and transferred to the recovery room in good condition.  OPERATIVE FINDINGS:  Bilateral chronic pansinusitis and polyposis.  SPECIMEN:  Bilateral sinus contents.  FOLLOWUP CARE:  The patient will be discharged home once awake and alert.  She will be placed on amoxicillin 875 mg p.o. b.i.d. for 5 days. Percocet may be taken on a p.r.n. basis for pain control.  The patient will follow up in my office in approximately 1 week.  Lemon Whitacre,SUI W 08/16/2015 11:11 AM

## 2015-08-16 NOTE — Anesthesia Procedure Notes (Signed)
Procedure Name: Intubation Date/Time: 08/16/2015 8:44 AM Performed by: Lieutenant Diego Pre-anesthesia Checklist: Patient identified, Emergency Drugs available, Suction available and Patient being monitored Patient Re-evaluated:Patient Re-evaluated prior to inductionOxygen Delivery Method: Circle System Utilized Preoxygenation: Pre-oxygenation with 100% oxygen Intubation Type: IV induction Ventilation: Mask ventilation without difficulty Laryngoscope Size: Miller and 2 Grade View: Grade I Tube type: Oral Tube size: 7.0 mm Number of attempts: 1 Airway Equipment and Method: Stylet and Oral airway Placement Confirmation: ETT inserted through vocal cords under direct vision,  positive ETCO2 and breath sounds checked- equal and bilateral Secured at: 21 cm Tube secured with: Tape Dental Injury: Teeth and Oropharynx as per pre-operative assessment

## 2015-08-16 NOTE — Anesthesia Preprocedure Evaluation (Signed)
Anesthesia Evaluation  Patient identified by MRN, date of birth, ID band Patient awake    Reviewed: Allergy & Precautions, NPO status , Patient's Chart, lab work & pertinent test results  Airway Mallampati: I  TM Distance: >3 FB Neck ROM: Full    Dental  (+) Teeth Intact, Dental Advisory Given   Pulmonary asthma ,    breath sounds clear to auscultation       Cardiovascular hypertension, Pt. on medications  Rhythm:Regular Rate:Normal     Neuro/Psych    GI/Hepatic GERD  Medicated and Controlled,  Endo/Other    Renal/GU      Musculoskeletal   Abdominal   Peds  Hematology   Anesthesia Other Findings   Reproductive/Obstetrics                             Anesthesia Physical Anesthesia Plan  ASA: II  Anesthesia Plan: General   Post-op Pain Management:    Induction: Intravenous  Airway Management Planned: Oral ETT  Additional Equipment:   Intra-op Plan:   Post-operative Plan: Extubation in OR  Informed Consent: I have reviewed the patients History and Physical, chart, labs and discussed the procedure including the risks, benefits and alternatives for the proposed anesthesia with the patient or authorized representative who has indicated his/her understanding and acceptance.   Dental advisory given  Plan Discussed with: CRNA, Anesthesiologist and Surgeon  Anesthesia Plan Comments:         Anesthesia Quick Evaluation

## 2015-08-16 NOTE — Anesthesia Postprocedure Evaluation (Signed)
  Anesthesia Post-op Note  Patient: Hannah Hays  Procedure(s) Performed: Procedure(s): ETHMOIDECTOMY, SPHENOIDECTOMY, MAXILLARY ANTROSTOMY, FRONTAL RECESS EXPLORATION WITH FUSION NAVIGATION (Bilateral) SEPTOPLASTY (N/A)  Patient Location: PACU  Anesthesia Type: General   Level of Consciousness: awake, alert  and oriented  Airway and Oxygen Therapy: Patient Spontanous Breathing  Post-op Pain: mild  Post-op Assessment: Post-op Vital signs reviewed  Post-op Vital Signs: Reviewed  Last Vitals:  Filed Vitals:   08/16/15 1200  BP: 135/60  Pulse: 85  Temp:   Resp: 14    Complications: No apparent anesthesia complications

## 2015-08-16 NOTE — Transfer of Care (Signed)
Immediate Anesthesia Transfer of Care Note  Patient: Hannah Hays  Procedure(s) Performed: Procedure(s): ETHMOIDECTOMY, SPHENOIDECTOMY, MAXILLARY ANTROSTOMY, FRONTAL RECESS EXPLORATION WITH FUSION NAVIGATION (Bilateral) SEPTOPLASTY (N/A)  Patient Location: PACU  Anesthesia Type:General  Level of Consciousness: awake  Airway & Oxygen Therapy: Patient Spontanous Breathing and Patient connected to face mask oxygen  Post-op Assessment: Report given to RN and Post -op Vital signs reviewed and stable  Post vital signs: Reviewed and stable  Last Vitals:  Filed Vitals:   08/16/15 0746  BP: 156/73  Pulse: 56  Temp: 36.6 C  Resp: 18    Complications: No apparent anesthesia complications

## 2015-08-17 ENCOUNTER — Encounter (HOSPITAL_BASED_OUTPATIENT_CLINIC_OR_DEPARTMENT_OTHER): Payer: Self-pay | Admitting: Otolaryngology

## 2015-08-19 ENCOUNTER — Ambulatory Visit (INDEPENDENT_AMBULATORY_CARE_PROVIDER_SITE_OTHER): Payer: BLUE CROSS/BLUE SHIELD | Admitting: Otolaryngology

## 2015-08-19 DIAGNOSIS — J320 Chronic maxillary sinusitis: Secondary | ICD-10-CM

## 2015-08-19 DIAGNOSIS — J321 Chronic frontal sinusitis: Secondary | ICD-10-CM | POA: Diagnosis not present

## 2015-08-19 DIAGNOSIS — J322 Chronic ethmoidal sinusitis: Secondary | ICD-10-CM

## 2015-08-19 DIAGNOSIS — J323 Chronic sphenoidal sinusitis: Secondary | ICD-10-CM

## 2015-09-02 ENCOUNTER — Ambulatory Visit: Payer: BLUE CROSS/BLUE SHIELD

## 2015-09-02 ENCOUNTER — Ambulatory Visit (INDEPENDENT_AMBULATORY_CARE_PROVIDER_SITE_OTHER): Payer: BLUE CROSS/BLUE SHIELD | Admitting: Otolaryngology

## 2015-09-02 ENCOUNTER — Telehealth: Payer: Self-pay

## 2015-09-02 DIAGNOSIS — J323 Chronic sphenoidal sinusitis: Secondary | ICD-10-CM | POA: Diagnosis not present

## 2015-09-02 DIAGNOSIS — J320 Chronic maxillary sinusitis: Secondary | ICD-10-CM

## 2015-09-02 DIAGNOSIS — E785 Hyperlipidemia, unspecified: Secondary | ICD-10-CM

## 2015-09-02 DIAGNOSIS — J321 Chronic frontal sinusitis: Secondary | ICD-10-CM

## 2015-09-02 DIAGNOSIS — J322 Chronic ethmoidal sinusitis: Secondary | ICD-10-CM | POA: Diagnosis not present

## 2015-09-02 DIAGNOSIS — R7302 Impaired glucose tolerance (oral): Secondary | ICD-10-CM

## 2015-09-02 DIAGNOSIS — I10 Essential (primary) hypertension: Secondary | ICD-10-CM

## 2015-09-11 ENCOUNTER — Other Ambulatory Visit: Payer: Self-pay | Admitting: Family Medicine

## 2015-09-23 ENCOUNTER — Encounter: Payer: Self-pay | Admitting: Family Medicine

## 2015-09-23 ENCOUNTER — Ambulatory Visit (INDEPENDENT_AMBULATORY_CARE_PROVIDER_SITE_OTHER): Payer: BLUE CROSS/BLUE SHIELD | Admitting: Family Medicine

## 2015-09-23 VITALS — BP 134/84 | HR 81 | Resp 16 | Ht 65.0 in | Wt 150.0 lb

## 2015-09-23 DIAGNOSIS — E785 Hyperlipidemia, unspecified: Secondary | ICD-10-CM | POA: Diagnosis not present

## 2015-09-23 DIAGNOSIS — F32A Depression, unspecified: Secondary | ICD-10-CM | POA: Insufficient documentation

## 2015-09-23 DIAGNOSIS — E559 Vitamin D deficiency, unspecified: Secondary | ICD-10-CM

## 2015-09-23 DIAGNOSIS — R7301 Impaired fasting glucose: Secondary | ICD-10-CM

## 2015-09-23 DIAGNOSIS — J4520 Mild intermittent asthma, uncomplicated: Secondary | ICD-10-CM

## 2015-09-23 DIAGNOSIS — I10 Essential (primary) hypertension: Secondary | ICD-10-CM

## 2015-09-23 DIAGNOSIS — F329 Major depressive disorder, single episode, unspecified: Secondary | ICD-10-CM

## 2015-09-23 DIAGNOSIS — J310 Chronic rhinitis: Secondary | ICD-10-CM

## 2015-09-23 DIAGNOSIS — R160 Hepatomegaly, not elsewhere classified: Secondary | ICD-10-CM

## 2015-09-23 NOTE — Assessment & Plan Note (Signed)
Improved on paxil, PHQ 9 score of 4 today, continue current dose

## 2015-09-23 NOTE — Assessment & Plan Note (Signed)
Controlled, no change in medication DASH diet and commitment to daily physical activity for a minimum of 30 minutes discussed and encouraged, as a part of hypertension management. The importance of attaining a healthy weight is also discussed.  BP/Weight 09/23/2015 08/16/2015 06/10/2015 02/11/2015 08/20/2014 03/05/2014 5/99/7741  Systolic BP 423 953 202 334 356 861 683  Diastolic BP 84 55 80 84 82 84 70  Wt. (Lbs) 150 143.13 135.04 133.12 135 133 126  BMI 24.96 23.82 22.47 22.15 22.47 22.13 20.97

## 2015-09-23 NOTE — Progress Notes (Signed)
   Subjective:    Patient ID: Hannah Hays, female    DOB: January 17, 1966, 49 y.o.   MRN: [id number]  HPI   Hannah Hays     MRN: [id number]      DOB: October 15, 1966   HPI [name] is here for follow up and re-evaluation of chronic medical conditions, medication management and review of any available recent lab and radiology data.  Preventive health is updated, specifically  Cancer screening and Immunization.   Questions or concerns regarding consultations or procedures which the PT has had in the interim are  addressed. The PT denies any adverse reactions to current medications since the last visit.  There are no new concerns.  There are no specific complaints   ROS Denies recent fever or chills. Denies sinus pressure, nasal congestion, ear pain or sore throat. Denies chest congestion, productive cough or wheezing. Denies chest pains, palpitations and leg swelling Denies abdominal pain, nausea, vomiting,diarrhea or constipation.   Denies dysuria, frequency, hesitancy or incontinence. Denies joint pain, swelling and limitation in mobility. Denies headaches, seizures, numbness, or tingling. Denies depression, anxiety or insomnia. Denies skin break down or rash.   PE  BP 134/84 mmHg  Pulse 81  Resp 16  Ht 5\' 5"  (1.651 m)  Wt 150 lb (68.04 kg)  BMI 24.96 kg/m2  SpO2 98%  Patient alert and oriented and in no cardiopulmonary distress.  HEENT: No facial asymmetry, EOMI,   oropharynx pink and moist.  Neck supple no JVD, no mass.  Chest: Clear to auscultation bilaterally.  CVS: S1, S2 no murmurs, no S3.Regular rate.  ABD: Soft non tender.   Ext: No edema  MS: Adequate ROM spine, shoulders, hips and knees.  Skin: Intact, no ulcerations or rash noted.  Psych: Good eye contact, normal affect. Memory intact not anxious or depressed appearing.  CNS: CN 2-12 intact, power,  normal throughout.no focal deficits noted.   Assessment & Plan   HTN (hypertension) Controlled,  no change in medication DASH diet and commitment to daily physical activity for a minimum of 30 minutes discussed and encouraged, as a part of hypertension management. The importance of attaining a healthy weight is also discussed.  BP/Weight 09/23/2015 08/16/2015 06/10/2015 02/11/2015 08/20/2014 03/05/2014 01/13/4708  Systolic BP 628 366 294 765 465 035 465  Diastolic BP 84 55 80 84 82 84 70  Wt. (Lbs) 150 143.13 135.04 133.12 135 133 126  BMI 24.96 23.82 22.47 22.15 22.47 22.13 20.97        Asthma Well controlled, uses rescue inhaler on avg once per month  Chronic nonallergic rhinitis Improved following surgery, no longer using astelin, has ENT re eval, had rhinoplasty in Sept , 2016  Dyslipidemia Hyperlipidemia:Low fat diet discussed and encouraged.   Lipid Panel  Lab Results  Component Value Date   CHOL 223* 02/11/2015   HDL 55 02/11/2015   LDLCALC 152* 02/11/2015   TRIG 79 02/11/2015   CHOLHDL 4.1 02/11/2015      Updated lab needed at/ before next visit.   Depression Improved on paxil, PHQ 9 score of 4 today, continue current dose       Review of Systems     Objective:   Physical Exam        Assessment & Plan:

## 2015-09-23 NOTE — Assessment & Plan Note (Signed)
Well controlled, uses rescue inhaler on avg once per month

## 2015-09-23 NOTE — Assessment & Plan Note (Signed)
Improved following surgery, no longer using astelin, has ENT re eval, had rhinoplasty in Sept , 2016

## 2015-09-23 NOTE — Assessment & Plan Note (Signed)
Hyperlipidemia:Low fat diet discussed and encouraged.   Lipid Panel  Lab Results  Component Value Date   CHOL 223* 02/11/2015   HDL 55 02/11/2015   LDLCALC 152* 02/11/2015   TRIG 79 02/11/2015   CHOLHDL 4.1 02/11/2015      Updated lab needed at/ before next visit.

## 2015-09-23 NOTE — Patient Instructions (Signed)
F/u 5 months, call if you need me before  Thankful surgery has been successful and that you are doing well  It is important that you exercise regularly at least 30 minutes 5 times a week. If you develop chest pain, have severe difficulty breathing, or feel very tired, stop exercising immediately and seek medical attention   Target weight is 145 pounds  Fasting lipid, chem 7, HBA1C, TSH and vit D March 111 or after  Thanks for choosing [facility], we consider it a privelige to serve you.   ALL THE BEST!, HAPPY HOLIDAYS

## 2015-09-30 ENCOUNTER — Ambulatory Visit (INDEPENDENT_AMBULATORY_CARE_PROVIDER_SITE_OTHER): Payer: BLUE CROSS/BLUE SHIELD | Admitting: Otolaryngology

## 2015-09-30 DIAGNOSIS — J322 Chronic ethmoidal sinusitis: Secondary | ICD-10-CM | POA: Diagnosis not present

## 2015-09-30 DIAGNOSIS — J321 Chronic frontal sinusitis: Secondary | ICD-10-CM | POA: Diagnosis not present

## 2015-09-30 DIAGNOSIS — J320 Chronic maxillary sinusitis: Secondary | ICD-10-CM | POA: Diagnosis not present

## 2015-10-01 LAB — HEMOGLOBIN A1C
A1C: 5.9
LDL: 138

## 2015-10-02 ENCOUNTER — Other Ambulatory Visit: Payer: Self-pay | Admitting: Family Medicine

## 2015-10-08 LAB — HEMOGLOBIN A1C
A1C: 5.9
Cholesterol: 230
LDL: 138

## 2015-12-17 ENCOUNTER — Telehealth: Payer: Self-pay

## 2015-12-17 NOTE — Telephone Encounter (Signed)
Patient is scheduled for Tues Jan 17 at 8:45

## 2015-12-17 NOTE — Telephone Encounter (Signed)
pls give pt a work in appt early next week

## 2015-12-21 ENCOUNTER — Ambulatory Visit (HOSPITAL_COMMUNITY)
Admission: RE | Admit: 2015-12-21 | Discharge: 2015-12-21 | Disposition: A | Discharge status: Home / Self Care | Payer: BLUE CROSS/BLUE SHIELD | Source: Ambulatory Visit | Attending: Family Medicine | Admitting: Family Medicine

## 2015-12-21 ENCOUNTER — Encounter: Payer: Self-pay | Admitting: Family Medicine

## 2015-12-21 ENCOUNTER — Ambulatory Visit (INDEPENDENT_AMBULATORY_CARE_PROVIDER_SITE_OTHER): Payer: BLUE CROSS/BLUE SHIELD | Admitting: Family Medicine

## 2015-12-21 VITALS — BP 160/96 | HR 88 | Resp 16 | Ht 65.0 in | Wt 148.4 lb

## 2015-12-21 DIAGNOSIS — M542 Cervicalgia: Secondary | ICD-10-CM | POA: Insufficient documentation

## 2015-12-21 DIAGNOSIS — M25512 Pain in left shoulder: Secondary | ICD-10-CM | POA: Insufficient documentation

## 2015-12-21 DIAGNOSIS — F329 Major depressive disorder, single episode, unspecified: Secondary | ICD-10-CM

## 2015-12-21 DIAGNOSIS — J4540 Moderate persistent asthma, uncomplicated: Secondary | ICD-10-CM

## 2015-12-21 DIAGNOSIS — Z1211 Encounter for screening for malignant neoplasm of colon: Secondary | ICD-10-CM | POA: Diagnosis not present

## 2015-12-21 DIAGNOSIS — M545 Low back pain, unspecified: Secondary | ICD-10-CM

## 2015-12-21 DIAGNOSIS — Q057 Lumbar spina bifida without hydrocephalus: Secondary | ICD-10-CM | POA: Insufficient documentation

## 2015-12-21 DIAGNOSIS — J339 Nasal polyp, unspecified: Secondary | ICD-10-CM

## 2015-12-21 DIAGNOSIS — R7302 Impaired glucose tolerance (oral): Secondary | ICD-10-CM

## 2015-12-21 DIAGNOSIS — M25511 Pain in right shoulder: Secondary | ICD-10-CM | POA: Insufficient documentation

## 2015-12-21 DIAGNOSIS — I10 Essential (primary) hypertension: Secondary | ICD-10-CM | POA: Diagnosis not present

## 2015-12-21 DIAGNOSIS — M5440 Lumbago with sciatica, unspecified side: Secondary | ICD-10-CM

## 2015-12-21 DIAGNOSIS — F32A Depression, unspecified: Secondary | ICD-10-CM

## 2015-12-21 DIAGNOSIS — M5430 Sciatica, unspecified side: Secondary | ICD-10-CM

## 2015-12-21 DIAGNOSIS — R52 Pain, unspecified: Secondary | ICD-10-CM

## 2015-12-21 DIAGNOSIS — D179 Benign lipomatous neoplasm, unspecified: Secondary | ICD-10-CM

## 2015-12-21 MED ORDER — IBUPROFEN 800 MG PO TABS: 800.0000 mg | ORAL_TABLET | Freq: Three times a day (TID) | ORAL | Status: DC | PRN

## 2015-12-21 MED ORDER — PREDNISONE 5 MG (21) PO TBPK: 5.0000 mg | ORAL_TABLET | ORAL | Status: DC

## 2015-12-21 MED ORDER — MOMETASONE FURO-FORMOTEROL FUM 200-5 MCG/ACT IN AERO: 2.0000 | INHALATION_SPRAY | Freq: Two times a day (BID) | RESPIRATORY_TRACT | Status: DC

## 2015-12-21 MED ORDER — AMLODIPINE BESYLATE 10 MG PO TABS: 10.0000 mg | ORAL_TABLET | Freq: Every day | ORAL | Status: DC

## 2015-12-21 NOTE — Patient Instructions (Addendum)
F/u in march as before  Nurse BP check in 4 weeks  Increase amlodipine to 10 mg daily, OK to take TWO 5 mg daily till done  Xray of neck and low back today, ibuprofen and prednisone sent for arthritis pain  If persists call I will refer to rheumatology  Thanks for choosing [facility], we consider it a privelige to serve you.  All the best!

## 2015-12-21 NOTE — Progress Notes (Signed)
Subjective:    Patient ID: Hannah Hays, female    DOB: 1966/11/22, 50 y.o.   MRN: [id number]  HPI   Hannah Hays     MRN: [id number]      DOB: Jul 15, 1966   HPI Hannah Hays is here for follow up and re-evaluation of chronic medical conditions, medication management and review of any available recent lab and radiology data.  Preventive health is updated, specifically  Cancer screening and Immunization.   Questions or concerns regarding consultations or procedures which the PT has had in the interim are  addressed. The PT denies any adverse reactions to current medications since the last visit.  1 month h/o progressively debilitating generalized joint pain esp in neck and lower back C/o swelling on back of neck, non tender, no drainage from the area  ROS Denies recent fever or chills. Denies sinus pressure, nasal congestion, ear pain or sore throat. Denies chest congestion, productive cough or wheezing. Denies chest pains, palpitations and leg swelling Denies abdominal pain, nausea, vomiting,diarrhea or constipation.   Denies dysuria, frequency, hesitancy or incontinence.  Denies headaches, seizures, numbness, or tingling. Denies depression, anxiety or insomnia. Denies skin break down or rash.   PE  BP 160/96 mmHg  Pulse 88  Resp 16  Ht 5\' 5"  (1.651 m)  Wt 148 lb 6.4 oz (67.314 kg)  BMI 24.70 kg/m2  SpO2 98%  Patient alert and oriented and in no cardiopulmonary distress.  HEENT: No facial asymmetry, EOMI,   oropharynx pink and moist.  Neck decreased ROM, with tenderness, no spasmno JVD, no mass.  Chest: Clear to auscultation bilaterally.  CVS: S1, S2 no murmurs, no S3.Regular rate.  ABD: Soft non tender.   Ext: No edema  MS: Adequate ROM spine, shoulders, hips and knees.Generalized joint tenderness, no warmth, or swelling or erythema  noted  Skin: Intact, no ulcerations or rash noted.lipoma on back of neck  Psych: Good eye contact, normal affect. Memory  intact not anxious or depressed appearing.  CNS: CN 2-12 intact, power,  normal throughout.no focal deficits noted.   Assessment & Plan   Generalized pain Worse in past 4 weeks, ibuprofen and prednisone prescribed. X ays of neck and low back ordered. Will refer for rheumatology eval if persists or worsens, she is to call  Depression Controlled, no change in medication   HTN (hypertension) Uncontrolled, inc dose of amlodipine and re eval in 4 weeks by nurse visit DASH diet and commitment to daily physical activity for a minimum of 30 minutes discussed and encouraged, as a part of hypertension management. The importance of attaining a healthy weight is also discussed.  BP/Weight 12/21/2015 09/23/2015 08/16/2015 06/10/2015 02/11/2015 [id number]  Systolic BP 0000000 Q000111Q Q000111Q Q000111Q A999333 0000000 0000000  Diastolic BP 96 84 55 80 84 82 84  Wt. (Lbs) 148.4 150 143.13 135.04 133.12 135 133  BMI 24.7 24.96 23.82 22.47 22.15 22.47 22.13        Asthma Controlled, no change in medication   IGT (impaired glucose tolerance) Patient educated about the importance of limiting  Carbohydrate intake , the need to commit to daily physical activity for a minimum of 30 minutes , and to commit weight loss. The fact that changes in all these areas will reduce or eliminate all together the development of diabetes is stressed.  Updated lab needed at/ before next visit.   Diabetic Labs Latest Ref Rng 09/16/2015 08/12/2015 02/11/2015 03/03/2014 07/10/2013  HbA1c <5.7 % - - 5.7(H) - 5.3  Chol - 230 - 223(H) 220(H) -  HDL >=46 mg/dL - - 55 59 -  Calc LDL 0 - 99 mg/dL - - 152(H) 150(H) -  Triglycerides <150 mg/dL - - 79 53 -  Creatinine 0.44 - 1.00 mg/dL - 0.71 0.75 0.72 0.84   BP/Weight 12/21/2015 09/23/2015 08/16/2015 06/10/2015 02/11/2015 [id number] [id number]  Systolic BP 0000000 Q000111Q Q000111Q Q000111Q A999333 0000000 0000000  Diastolic BP 96 84 55 80 84 82 84  Wt. (Lbs) 148.4 150 143.13 135.04 133.12 135 133  BMI 24.7 24.96 23.82 22.47 22.15  22.47 22.13   No flowsheet data found.     Lipoma Lipoma on posterior neck, pt reassured of benign nature , will follow       Review of Systems     Objective:   Physical Exam        Assessment & Plan:

## 2015-12-26 DIAGNOSIS — R52 Pain, unspecified: Secondary | ICD-10-CM | POA: Insufficient documentation

## 2015-12-26 DIAGNOSIS — D179 Benign lipomatous neoplasm, unspecified: Secondary | ICD-10-CM | POA: Insufficient documentation

## 2015-12-26 NOTE — Assessment & Plan Note (Signed)
Uncontrolled, inc dose of amlodipine and re eval in 4 weeks by nurse visit DASH diet and commitment to daily physical activity for a minimum of 30 minutes discussed and encouraged, as a part of hypertension management. The importance of attaining a healthy weight is also discussed.  BP/Weight 12/21/2015 09/23/2015 08/16/2015 06/10/2015 02/11/2015 [id number] [id number]  Systolic BP 0000000 Q000111Q Q000111Q Q000111Q A999333 0000000 0000000  Diastolic BP 96 84 55 80 84 82 84  Wt. (Lbs) 148.4 150 143.13 135.04 133.12 135 133  BMI 24.7 24.96 23.82 22.47 22.15 22.47 22.13

## 2015-12-26 NOTE — Assessment & Plan Note (Signed)
Worse in past 4 weeks, ibuprofen and prednisone prescribed. X ays of neck and low back ordered. Will refer for rheumatology eval if persists or worsens, she is to call

## 2015-12-26 NOTE — Assessment & Plan Note (Signed)
Patient educated about the importance of limiting  Carbohydrate intake , the need to commit to daily physical activity for a minimum of 30 minutes , and to commit weight loss. The fact that changes in all these areas will reduce or eliminate all together the development of diabetes is stressed.  Updated lab needed at/ before next visit.   Diabetic Labs Latest Ref Rng 09/16/2015 08/12/2015 02/11/2015 03/03/2014 07/10/2013  HbA1c <5.7 % - - 5.7(H) - 5.3  Chol - 230 - 223(H) 220(H) -  HDL >=46 mg/dL - - 55 59 -  Calc LDL 0 - 99 mg/dL - - 152(H) 150(H) -  Triglycerides <150 mg/dL - - 79 53 -  Creatinine 0.44 - 1.00 mg/dL - 0.71 0.75 0.72 0.84   BP/Weight 12/21/2015 09/23/2015 08/16/2015 06/10/2015 02/11/2015 [id number]  Systolic BP 0000000 Q000111Q Q000111Q Q000111Q A999333 0000000 0000000  Diastolic BP 96 84 55 80 84 82 84  Wt. (Lbs) 148.4 150 143.13 135.04 133.12 135 133  BMI 24.7 24.96 23.82 22.47 22.15 22.47 22.13   No flowsheet data found.

## 2015-12-26 NOTE — Assessment & Plan Note (Signed)
Controlled, no change in medication  

## 2015-12-26 NOTE — Assessment & Plan Note (Signed)
Lipoma on posterior neck, pt reassured of benign nature , will follow

## 2016-01-05 ENCOUNTER — Telehealth: Payer: Self-pay

## 2016-01-05 NOTE — Telephone Encounter (Signed)
Pt received a letter from DS. I told her that DS would have to call her back. She can be reached at [phone]. Pt said that she was still at work and if she doesn't answer that she would call back after 4pm

## 2016-01-06 NOTE — Telephone Encounter (Signed)
Dr. Oneida Alar, this pt was referred by Dr. Moshe Cipro for screening colonoscopy.  See encounter from 05/13/2008, recommendations were for next colonoscopy in 2018, but it is not nic'd. Pt said she is not having any problems and no family hx of colon cancer. Please advise!

## 2016-01-06 NOTE — Telephone Encounter (Signed)
PLEASE CALL PT. SHE HAD TCS IN 2008 WITH ONE SIMPLE ADENOMA. HER NEXT TCS COULD BE BETWEEN 2013 AND 2018. SHE WAS LESS THAN 49 WHEN SHE HAD HER FIRST ADENOMA DETECTED. SINCE IT HAS BEEN 9 YEARS SINCE HER LAST TCS AND HER FIRST ADENOMA WAS DETECTED AGE < 30. SHE SHOULD HAVE A TCS IN 2017.

## 2016-01-06 NOTE — Telephone Encounter (Signed)
LMOM to call.

## 2016-01-10 NOTE — Telephone Encounter (Signed)
Pt is aware and is not at home now. She will call me about 11:00 Am tomorrow to be triaged for the colonoscopy.

## 2016-01-10 NOTE — Telephone Encounter (Signed)
LMOM for a return call.  

## 2016-01-11 ENCOUNTER — Telehealth: Payer: Self-pay

## 2016-01-11 NOTE — Telephone Encounter (Signed)
SUPREP SPLIT DOSING- CLEAR LIQUIDS WITH BREAKFAST.  

## 2016-01-11 NOTE — Telephone Encounter (Signed)
Gastroenterology Pre-Procedure Review  Request Date: 01/11/2016 Requesting Physician: Dr. Moshe Cipro  PATIENT REVIEW QUESTIONS: The patient responded to the following health history questions as indicated:    1. Diabetes Melitis: no 2. Joint replacements in the past 12 months: no 3. Major health problems in the past 3 months: no 4. Has an artificial valve or MVP: no 5. Has a defibrillator: no 6. Has been advised in past to take antibiotics in advance of a procedure like teeth cleaning: no 7. Family history of colon cancer: no  8. Alcohol Use: no 9. History of sleep apnea: no     MEDICATIONS & ALLERGIES:    Patient reports the following regarding taking any blood thinners:   Plavix? no Aspirin? no Coumadin? no  Patient confirms/reports the following medications:  Current Outpatient Prescriptions  Medication Sig Dispense Refill  . albuterol (PROVENTIL HFA;VENTOLIN HFA) 108 (90 BASE) MCG/ACT inhaler Inhale 2 puffs into the lungs every 6 (six) hours as needed for wheezing or shortness of breath. 6.7 g 3  . albuterol (PROVENTIL) (2.5 MG/3ML) 0.083% nebulizer solution Take 3 mLs (2.5 mg total) by nebulization every 4 (four) hours as needed for wheezing. 30 vial 3  . amLODipine (NORVASC) 10 MG tablet Take 1 tablet (10 mg total) by mouth daily. 90 tablet 1  . Calcium Carbonate-Vitamin D (CALCIUM 600 + D PO) Take 2 tablets by mouth at bedtime.     Marland Kitchen ibuprofen (ADVIL,MOTRIN) 800 MG tablet Take 1 tablet (800 mg total) by mouth every 8 (eight) hours as needed. 30 tablet 0  . mirtazapine (REMERON) 15 MG tablet TAKE 1/2 TABLET BY MOUTH AT BEDTIME. 15 tablet 2  . mometasone-formoterol (DULERA) 200-5 MCG/ACT AERO Inhale 2 puffs into the lungs 2 (two) times daily. 13 g 3  . montelukast (SINGULAIR) 10 MG tablet Take 10 mg by mouth at bedtime.      . Multiple Vitamin (MULTIVITAMIN WITH MINERALS) TABS Take 1 tablet by mouth at bedtime.     Marland Kitchen PARoxetine (PAXIL) 10 MG tablet Take 1 tablet (10 mg total) by  mouth daily. 30 tablet 4  . Vitamin D, Ergocalciferol, (DRISDOL) 50000 UNITS CAPS capsule TAKE 1 CAPSULE BY MOUTH ONCE A WEEK. 4 capsule 3  . predniSONE (STERAPRED UNI-PAK 21 TAB) 5 MG (21) TBPK tablet Take 1 tablet (5 mg total) by mouth as directed. Use as directed (Patient not taking: Reported on 01/11/2016) 21 tablet 0   No current facility-administered medications for this visit.    Patient confirms/reports the following allergies:  Allergies  Allergen Reactions  . Flora-Q Rash    No orders of the defined types were placed in this encounter.    AUTHORIZATION INFORMATION Primary Insurance:   ID #:  Group #:  Pre-Cert / Auth required:  Pre-Cert / Auth #:   Secondary Insurance:   ID #:   Group #:  Pre-Cert / Auth required:  Pre-Cert / Auth #:   SCHEDULE INFORMATION: Procedure has been scheduled as follows:  Date:              Time:   Location:   This Gastroenterology Pre-Precedure Review Form is being routed to the following provider(s): Barney Drain, MD

## 2016-01-11 NOTE — Telephone Encounter (Signed)
See separate triage.  

## 2016-01-12 ENCOUNTER — Other Ambulatory Visit: Payer: Self-pay

## 2016-01-12 DIAGNOSIS — Z86010 Personal history of colonic polyps: Secondary | ICD-10-CM

## 2016-01-12 MED ORDER — NA SULFATE-K SULFATE-MG SULF 17.5-3.13-1.6 GM/177ML PO SOLN: 1.0000 | ORAL | Status: DC

## 2016-01-12 NOTE — Telephone Encounter (Signed)
Pt is scheduled for the colonoscopy on 02/02/2016 at 12:15 pm. Rx sent to the pharmacy and instructions mailed to pt.

## 2016-01-27 ENCOUNTER — Ambulatory Visit (INDEPENDENT_AMBULATORY_CARE_PROVIDER_SITE_OTHER): Payer: BLUE CROSS/BLUE SHIELD | Admitting: Otolaryngology

## 2016-01-31 ENCOUNTER — Telehealth: Payer: Self-pay

## 2016-01-31 NOTE — Telephone Encounter (Signed)
I called [employer] and spoke to [place] S who said a PA is not required for the screening colonoscopy. Reference # [id number].

## 2016-02-02 ENCOUNTER — Encounter (HOSPITAL_COMMUNITY)
Admission: RE | Disposition: A | Discharge status: Home / Self Care | Payer: Self-pay | Source: Ambulatory Visit | Attending: Gastroenterology

## 2016-02-02 ENCOUNTER — Encounter (HOSPITAL_COMMUNITY): Payer: Self-pay | Admitting: *Deleted

## 2016-02-02 ENCOUNTER — Ambulatory Visit (HOSPITAL_COMMUNITY)
Admission: RE | Admit: 2016-02-02 | Discharge: 2016-02-02 | Disposition: A | Discharge status: Home / Self Care | Payer: BLUE CROSS/BLUE SHIELD | Source: Ambulatory Visit | Attending: Gastroenterology | Admitting: Gastroenterology

## 2016-02-02 DIAGNOSIS — J45909 Unspecified asthma, uncomplicated: Secondary | ICD-10-CM | POA: Insufficient documentation

## 2016-02-02 DIAGNOSIS — Q438 Other specified congenital malformations of intestine: Secondary | ICD-10-CM | POA: Insufficient documentation

## 2016-02-02 DIAGNOSIS — Z1211 Encounter for screening for malignant neoplasm of colon: Secondary | ICD-10-CM | POA: Insufficient documentation

## 2016-02-02 DIAGNOSIS — K219 Gastro-esophageal reflux disease without esophagitis: Secondary | ICD-10-CM | POA: Diagnosis not present

## 2016-02-02 DIAGNOSIS — Z86010 Personal history of colonic polyps: Secondary | ICD-10-CM

## 2016-02-02 DIAGNOSIS — Z79899 Other long term (current) drug therapy: Secondary | ICD-10-CM | POA: Diagnosis not present

## 2016-02-02 DIAGNOSIS — I10 Essential (primary) hypertension: Secondary | ICD-10-CM | POA: Diagnosis not present

## 2016-02-02 DIAGNOSIS — F419 Anxiety disorder, unspecified: Secondary | ICD-10-CM | POA: Diagnosis not present

## 2016-02-02 DIAGNOSIS — K648 Other hemorrhoids: Secondary | ICD-10-CM | POA: Diagnosis not present

## 2016-02-02 DIAGNOSIS — F329 Major depressive disorder, single episode, unspecified: Secondary | ICD-10-CM | POA: Insufficient documentation

## 2016-02-02 HISTORY — PX: COLONOSCOPY: SHX5424

## 2016-02-02 SURGERY — COLONOSCOPY
Anesthesia: Moderate Sedation

## 2016-02-02 MED ORDER — MEPERIDINE HCL 100 MG/ML IJ SOLN
INTRAMUSCULAR | Status: AC
  Filled 2016-02-02: qty 2

## 2016-02-02 MED ORDER — MEPERIDINE HCL 100 MG/ML IJ SOLN
INTRAMUSCULAR | Status: DC | PRN
  Administered 2016-02-02 (×3): 25 mg via INTRAVENOUS

## 2016-02-02 MED ORDER — SODIUM CHLORIDE 0.9 % IV SOLN
INTRAVENOUS | Status: DC
  Administered 2016-02-02: 12:00:00 via INTRAVENOUS

## 2016-02-02 MED ORDER — STERILE WATER FOR IRRIGATION IR SOLN
Status: DC | PRN
  Administered 2016-02-02: 12:00:00

## 2016-02-02 MED ORDER — MIDAZOLAM HCL 5 MG/5ML IJ SOLN
INTRAMUSCULAR | Status: AC
  Filled 2016-02-02: qty 10

## 2016-02-02 MED ORDER — MIDAZOLAM HCL 5 MG/5ML IJ SOLN
INTRAMUSCULAR | Status: DC | PRN
  Administered 2016-02-02: 2 mg via INTRAVENOUS
  Administered 2016-02-02: 1 mg via INTRAVENOUS
  Administered 2016-02-02: 2 mg via INTRAVENOUS

## 2016-02-02 NOTE — H&P (Signed)
Primary Care Physician:  Tula Nakayama, MD Primary Gastroenterologist:  Dr. Oneida Alar  Pre-Procedure History & Physical: HPI:  Hannah Hays is a 50 y.o. female here for  PERSONAL HISTORY OF POLYPS. TCS 2008: 2 mm simple adenoma.   Past Medical History  Diagnosis Date  . Asthma   . Allergic rhinitis, seasonal   . Migraine headache 17    recurrence 1st time in 10 year in 2011  . IBS (irritable bowel syndrome)     pre-dominant diarrhea  . History of colonoscopy 2008    with simple adenoma  . Helicobacter pylori gastritis 2009    s/p Prevpac treatment  . GERD (gastroesophageal reflux disease)   . Hypertension   . Depression   . Anxiety     Past Surgical History  Procedure Laterality Date  . Partial hysterectomy  2002  . Tubal ligation  1995  . Cholecystectomy    . Colonoscopy  11/13/2007    SLF:A 4 mm sessile transverse colon polyp/Random biopsies obtained throughout the colon/Otherwise normal. Negative for microscopic colitis, simple adenoma, due for surveillance 11/2017 per [facility]  . Esophagogastroduodenoscopy  03/20/2008    SLF:A 1- to 2-cm hiatal hernia, mild erythema in the antrum/Normal esophagus without evidence of Barrett's, +H.pylori s/p Prevapc treatment   . Esophagogastroduodenoscopy (egd) with esophageal dilation N/A 02/14/2013    BDZ:HGDJME Esophageal web/MILD Non-erosive gastritis (inflammation)  . Abdominal hysterectomy    . Sinus endo with fusion Bilateral 08/16/2015    Procedure: ETHMOIDECTOMY, SPHENOIDECTOMY, MAXILLARY ANTROSTOMY, FRONTAL RECESS EXPLORATION WITH FUSION NAVIGATION;  Surgeon: Leta Baptist, MD;  Location: [place];  Service: ENT;  Laterality: Bilateral;  . Septoplasty N/A 08/16/2015    Procedure: SEPTOPLASTY;  Surgeon: Leta Baptist, MD;  Location: [place];  Service: ENT;  Laterality: N/A;    Prior to Admission medications   Medication Sig Start Date End Date Taking? Authorizing Provider  albuterol (PROVENTIL HFA;VENTOLIN  HFA) 108 (90 BASE) MCG/ACT inhaler Inhale 2 puffs into the lungs every 6 (six) hours as needed for wheezing or shortness of breath. 03/07/13  Yes [name], MD  albuterol (PROVENTIL) (2.5 MG/3ML) 0.083% nebulizer solution Take 3 mLs (2.5 mg total) by nebulization every 4 (four) hours as needed for wheezing. 12/10/13  Yes [name], MD  amLODipine (NORVASC) 10 MG tablet Take 1 tablet (10 mg total) by mouth daily. 12/21/15  Yes [name], MD  Calcium Carbonate-Vitamin D (CALCIUM 600 + D PO) Take 2 tablets by mouth at bedtime.    Yes Historical Provider, MD  fluticasone (VERAMYST) 27.5 MCG/SPRAY nasal spray Place 2 sprays into the nose daily.   Yes Historical Provider, MD  ibuprofen (ADVIL,MOTRIN) 800 MG tablet Take 1 tablet (800 mg total) by mouth every 8 (eight) hours as needed. 12/21/15  Yes [name], MD  mirtazapine (REMERON) 15 MG tablet TAKE 1/2 TABLET BY MOUTH AT BEDTIME. 10/04/15  Yes [name], MD  mometasone-formoterol [facility]) 200-5 MCG/ACT AERO Inhale 2 puffs into the lungs 2 (two) times daily. 12/21/15  Yes [name], MD  montelukast (SINGULAIR) 10 MG tablet Take 10 mg by mouth at bedtime.     Yes Historical Provider, MD  Multiple Vitamin (MULTIVITAMIN WITH MINERALS) TABS Take 1 tablet by mouth at bedtime.    Yes Historical Provider, MD  Na Sulfate-K Sulfate-Mg Sulf (SUPREP BOWEL PREP) SOLN Take 1 kit by mouth as directed. 01/12/16  Yes Danie Binder, MD  PARoxetine (PAXIL) 10 MG tablet Take  1 tablet (10 mg total) by mouth daily. 06/10/15  Yes [name], MD  Vitamin D, Ergocalciferol, (DRISDOL) 50000 UNITS CAPS capsule TAKE 1 CAPSULE BY MOUTH ONCE A WEEK. 09/13/15  Yes [name], MD    Allergies as of 01/12/2016 - Review Complete 01/11/2016  Allergen Reaction Noted  . Flora-q Rash 02/08/2011    Family History  Problem Relation Age of Onset  . Hypertension Mother   . Heart disease Mother   . Diabetes Mother   . Alcohol  abuse Father   . Hypertension Father   . Hypertension Sister   . Kidney disease Sister   . Diabetes Sister   . Hypertension Sister   . Diabetes Sister   . Diabetes Brother   . Heart disease Brother   . Hypertension Brother   . Colon cancer Neg Hx     Social History   Social History  . Marital Status: Married    Spouse Name: N/A  . Number of Children: 2  . Years of Education: N/A   Occupational History  . employed International aid/development worker for [employer]   .     Social History Main Topics  . Smoking status: Never Smoker   . Smokeless tobacco: Not on file  . Alcohol Use: No  . Drug Use: No  . Sexual Activity: Yes    Birth Control/ Protection: Surgical   Other Topics Concern  . Not on file   Social History Narrative   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    Review of Systems: See HPI, otherwise negative ROS   Physical Exam: BP 133/64 mmHg  Pulse 77  Temp(Src) 97.9 F (36.6 C) (Oral)  Resp 12  Ht 5' 5.5" (1.664 m)  Wt 147 lb (66.679 kg)  BMI 24.08 kg/m2  SpO2 100% General:   Alert,  pleasant and cooperative in NAD Head:  Normocephalic and atraumatic. Neck:  Supple; Lungs:  Clear throughout to auscultation.    Heart:  Regular rate and rhythm. Abdomen:  Soft, nontender and nondistended. Normal bowel sounds, without guarding, and without rebound.   Neurologic:  Alert and  oriented x4;  grossly normal neurologically.  Impression/Plan:     SCREENING  Plan:  1. TCS TODAY

## 2016-02-02 NOTE — Discharge Instructions (Signed)
You have internal hemorrhoids. YOU DID NOT HAVE ANY POLYPS.   FOLLOW A HIGH FIBER DIET. AVOID ITEMS THAT CAUSE BLOATING. SEE INFO BELOW.  Use PREPARATION H UP TO 4 TIMES A DAY FOR RECTAL BLEEDING, ITCHING, PRESSURE, BURNING, OR PAIN.  Next colonoscopy in 5-10 years. YOUR SISTERS, BROTHERS, CHILDREN, AND PARENTS NEED TO HAVE A COLONOSCOPY STARTING AT THE AGE OF 40.   Colonoscopy Care After Read the instructions outlined below and refer to this sheet in the next week. These discharge instructions provide you with general information on caring for yourself after you leave the hospital. While your treatment has been planned according to the most current medical practices available, unavoidable complications occasionally occur. If you have any problems or questions after discharge, call DR. Audree Schrecengost, [phone].  ACTIVITY  You may resume your regular activity, but move at a slower pace for the next 24 hours.   Take frequent rest periods for the next 24 hours.   Walking will help get rid of the air and reduce the bloated feeling in your belly (abdomen).   No driving for 24 hours (because of the medicine (anesthesia) used during the test).   You may shower.   Do not sign any important legal documents or operate any machinery for 24 hours (because of the anesthesia used during the test).    NUTRITION  Drink plenty of fluids.   You may resume your normal diet as instructed by your doctor.   Begin with a light meal and progress to your normal diet. Heavy or fried foods are harder to digest and may make you feel sick to your stomach (nauseated).   Avoid alcoholic beverages for 24 hours or as instructed.    MEDICATIONS  You may resume your normal medications.   WHAT YOU CAN EXPECT TODAY  Some feelings of bloating in the abdomen.   Passage of more gas than usual.   Spotting of blood in your stool or on the toilet paper  .  IF YOU HAD POLYPS REMOVED DURING THE  COLONOSCOPY:  Eat a soft diet IF YOU HAVE NAUSEA, BLOATING, ABDOMINAL PAIN, OR VOMITING.    FINDING OUT THE RESULTS OF YOUR TEST Not all test results are available during your visit. DR. Oneida Alar WILL CALL YOU WITHIN 7 DAYS OF YOUR PROCEDUE WITH YOUR RESULTS. Do not assume everything is normal if you have not heard from DR. Floyed Masoud IN ONE WEEK, CALL HER OFFICE AT [phone].  SEEK IMMEDIATE MEDICAL ATTENTION AND CALL THE OFFICE: [phone] IF:  You have more than a spotting of blood in your stool.   Your belly is swollen (abdominal distention).   You are nauseated or vomiting.   You have a temperature over 101F.   You have abdominal pain or discomfort that is severe or gets worse throughout the day.  High-Fiber Diet A high-fiber diet changes your normal diet to include more whole grains, legumes, fruits, and vegetables. Changes in the diet involve replacing refined carbohydrates with unrefined foods. The calorie level of the diet is essentially unchanged. The Dietary Reference Intake (recommended amount) for adult males is 38 grams per day. For adult females, it is 25 grams per day. Pregnant and lactating women should consume 28 grams of fiber per day. Fiber is the intact part of a plant that is not broken down during digestion. Functional fiber is fiber that has been isolated from the plant to provide a beneficial effect in the body. PURPOSE  Increase stool bulk.   Ease and  regulate bowel movements.   Lower cholesterol.  REDUCE RISK OF COLON CANCER  INDICATIONS THAT YOU NEED MORE FIBER  Constipation and hemorrhoids.   Uncomplicated diverticulosis (intestine condition) and irritable bowel syndrome.   Weight management.   As a protective measure against hardening of the arteries (atherosclerosis), diabetes, and cancer.   GUIDELINES FOR INCREASING FIBER IN THE DIET  Start adding fiber to the diet slowly. A gradual increase of about 5 more grams (2 slices of whole-wheat  bread, 2 servings of most fruits or vegetables, or 1 bowl of high-fiber cereal) per day is best. Too rapid an increase in fiber may result in constipation, flatulence, and bloating.   Drink enough water and fluids to keep your urine clear or pale yellow. Water, juice, or caffeine-free drinks are recommended. Not drinking enough fluid may cause constipation.   Eat a variety of high-fiber foods rather than one type of fiber.   Try to increase your intake of fiber through using high-fiber foods rather than fiber pills or supplements that contain small amounts of fiber.   The goal is to change the types of food eaten. Do not supplement your present diet with high-fiber foods, but replace foods in your present diet.   INCLUDE A VARIETY OF FIBER SOURCES  Replace refined and processed grains with whole grains, canned fruits with fresh fruits, and incorporate other fiber sources. White rice, white breads, and most bakery goods contain little or no fiber.   Brown whole-grain rice, buckwheat oats, and many fruits and vegetables are all good sources of fiber. These include: broccoli, Brussels sprouts, cabbage, cauliflower, beets, sweet potatoes, white potatoes (skin on), carrots, tomatoes, eggplant, squash, berries, fresh fruits, and dried fruits.   Cereals appear to be the richest source of fiber. Cereal fiber is found in whole grains and bran. Bran is the fiber-rich outer coat of cereal grain, which is largely removed in refining. In whole-grain cereals, the bran remains. In breakfast cereals, the largest amount of fiber is found in those with "bran" in their names. The fiber content is sometimes indicated on the label.   You may need to include additional fruits and vegetables each day.   In baking, for 1 cup white flour, you may use the following substitutions:   1 cup whole-wheat flour minus 2 tablespoons.   1/2 cup white flour plus 1/2 cup whole-wheat flour.   Hemorrhoids Hemorrhoids are  dilated (enlarged) veins around the rectum. Sometimes clots will form in the veins. This makes them swollen and painful. These are called thrombosed hemorrhoids. Causes of hemorrhoids include:  Constipation.   Straining to have a bowel movement.   HEAVY LIFTING  HOME CARE INSTRUCTIONS  Eat a well balanced diet and drink 6 to 8 glasses of water every day to avoid constipation. You may also use a bulk laxative.   Avoid straining to have bowel movements.   Keep anal area dry and clean.   Do not use a donut shaped pillow or sit on the toilet for long periods. This increases blood pooling and pain.   Move your bowels when your body has the urge; this will require less straining and will decrease pain and pressure.

## 2016-02-02 NOTE — Op Note (Signed)
[place], [id number]   COLONOSCOPY PROCEDURE REPORT  PATIENT: Hannah Hays, Hannah Hays  MR#: [id number] BIRTHDATE: [birth year] , 50  yrs. old GENDER: female ENDOSCOPIST: Danie Binder, MD REFERRED [id number] Moshe Cipro, M.D. PROCEDURE DATE:  Feb 14, 2016 PROCEDURE:   Colonoscopy, screening INDICATIONS:high risk patient with personal history of colonic polyps. MEDICATIONS: Demerol 100 mg IV and Versed 5 mg IV  DESCRIPTION OF PROCEDURE:    Physical exam was performed.  Informed consent was obtained from the patient after explaining the benefits, risks, and alternatives to procedure.  The patient was connected to monitor and placed in left lateral position. Continuous oxygen was provided by nasal cannula and IV medicine administered through an indwelling cannula.  After administration of sedation and rectal exam, the patients rectum was intubated and the [id number]  colonoscope was advanced under direct visualization to the ileum.  The scope was removed slowly by carefully examining the color, texture, anatomy, and integrity mucosa on the way out.  The patient was recovered in endoscopy and discharged home in satisfactory condition. Estimated blood loss is zero unless otherwise noted in this procedure report.    COLON FINDINGS: The colonic mucosa appeared normal throughout the entire examined colon.  , Moderate sized internal hemorrhoids were found.  , and The colon was redundant.  The patient was moved on to their back to reach the cecum.  PREP QUALITY: excellent.  CECAL W/D TIME: 10       minutes COMPLICATIONS: None  ENDOSCOPIC IMPRESSION: 1.   Moderate sized internal hemorrhoids 2.   The left colon IS SLIGHTLY redundant  RECOMMENDATIONS: HIGH FIBER DIET NEXT TCS IN 5-10 YEARS.  ALL FIRST DEGREE RELATIVES NEED TCS AT AGE 13.      _______________________________ eSignedDanie Binder, MD 02/14/2016 1:06 PM   CPT  CODES: ICD CODES:  The ICD and CPT codes recommended by this software are interpretations from the data that the clinical staff has captured with the software.  The verification of the translation of this report to the ICD and CPT codes and modifiers is the sole responsibility of the health care institution and practicing physician where this report was generated.  [facility]. will not be held responsible for the validity of the ICD and CPT codes included on this report.  AMA assumes no liability for data contained or not contained herein. CPT is a Designer, television/film set of the [employer].

## 2016-02-04 ENCOUNTER — Encounter (HOSPITAL_COMMUNITY): Payer: Self-pay | Admitting: Gastroenterology

## 2016-02-18 ENCOUNTER — Other Ambulatory Visit: Payer: Self-pay | Admitting: Allergy and Immunology

## 2016-02-18 NOTE — Telephone Encounter (Signed)
Needs ov for further refills

## 2016-02-22 ENCOUNTER — Other Ambulatory Visit: Payer: Self-pay | Admitting: Family Medicine

## 2016-02-22 ENCOUNTER — Ambulatory Visit (INDEPENDENT_AMBULATORY_CARE_PROVIDER_SITE_OTHER): Payer: BLUE CROSS/BLUE SHIELD | Admitting: Family Medicine

## 2016-02-22 ENCOUNTER — Other Ambulatory Visit: Payer: Self-pay

## 2016-02-22 ENCOUNTER — Encounter: Payer: Self-pay | Admitting: Family Medicine

## 2016-02-22 VITALS — BP 124/80 | HR 87 | Resp 16 | Ht 66.0 in | Wt 149.0 lb

## 2016-02-22 DIAGNOSIS — E785 Hyperlipidemia, unspecified: Secondary | ICD-10-CM

## 2016-02-22 DIAGNOSIS — J310 Chronic rhinitis: Secondary | ICD-10-CM

## 2016-02-22 DIAGNOSIS — I10 Essential (primary) hypertension: Secondary | ICD-10-CM

## 2016-02-22 DIAGNOSIS — R52 Pain, unspecified: Secondary | ICD-10-CM

## 2016-02-22 DIAGNOSIS — J4530 Mild persistent asthma, uncomplicated: Secondary | ICD-10-CM

## 2016-02-22 DIAGNOSIS — M2550 Pain in unspecified joint: Secondary | ICD-10-CM

## 2016-02-22 DIAGNOSIS — R7302 Impaired glucose tolerance (oral): Secondary | ICD-10-CM

## 2016-02-22 LAB — CBC WITH DIFFERENTIAL/PLATELET
BASOS PCT: 1 % (ref 0–1)
Basophils Absolute: 0 10*3/uL (ref 0.0–0.1)
EOS ABS: 0.2 10*3/uL (ref 0.0–0.7)
EOS PCT: 4 % (ref 0–5)
HCT: 34.9 % — ABNORMAL LOW (ref 36.0–46.0)
Hemoglobin: 11.8 g/dL — ABNORMAL LOW (ref 12.0–15.0)
LYMPHS ABS: 1.2 10*3/uL (ref 0.7–4.0)
Lymphocytes Relative: 31 % (ref 12–46)
MCH: 28.5 pg (ref 26.0–34.0)
MCHC: 33.8 g/dL (ref 30.0–36.0)
MCV: 84.3 fL (ref 78.0–100.0)
MONOS PCT: 18 % — AB (ref 3–12)
MPV: 10.9 fL (ref 8.6–12.4)
Monocytes Absolute: 0.7 10*3/uL (ref 0.1–1.0)
NEUTROS PCT: 46 % (ref 43–77)
Neutro Abs: 1.8 10*3/uL (ref 1.7–7.7)
PLATELETS: 152 10*3/uL (ref 150–400)
RBC: 4.14 MIL/uL (ref 3.87–5.11)
RDW: 15.4 % (ref 11.5–15.5)
WBC: 4 10*3/uL (ref 4.0–10.5)

## 2016-02-22 LAB — LIPID PANEL
CHOL/HDL RATIO: 3.8 ratio (ref <=5.0)
Cholesterol: 191 mg/dL (ref 125–200)
HDL: 50 mg/dL (ref >=46)
LDL CALC: 128 mg/dL (ref <130)
TRIGLYCERIDES: 67 mg/dL (ref <150)
VLDL: 13 mg/dL (ref <30)

## 2016-02-22 LAB — COMPREHENSIVE METABOLIC PANEL
ALK PHOS: 86 U/L (ref 33–130)
ALT: 13 U/L (ref 6–29)
AST: 17 U/L (ref 10–35)
Albumin: 4.1 g/dL (ref 3.6–5.1)
BUN: 11 mg/dL (ref 7–25)
CALCIUM: 9.3 mg/dL (ref 8.6–10.4)
CHLORIDE: 106 mmol/L (ref 98–110)
CO2: 27 mmol/L (ref 20–31)
Creat: 0.79 mg/dL (ref 0.50–1.05)
GLUCOSE: 103 mg/dL — AB (ref 65–99)
POTASSIUM: 3.8 mmol/L (ref 3.5–5.3)
Sodium: 142 mmol/L (ref 135–146)
Total Bilirubin: 0.4 mg/dL (ref 0.2–1.2)
Total Protein: 7 g/dL (ref 6.1–8.1)

## 2016-02-22 LAB — TSH: TSH: 1.89 m[IU]/L

## 2016-02-22 MED ORDER — KETOROLAC TROMETHAMINE 60 MG/2ML IM SOLN
60.0000 mg | Freq: Once | INTRAMUSCULAR | Status: AC
  Administered 2016-02-22: 60 mg via INTRAMUSCULAR

## 2016-02-22 MED ORDER — MIRTAZAPINE 15 MG PO TABS: ORAL_TABLET | ORAL | Status: DC

## 2016-02-22 MED ORDER — METHYLPREDNISOLONE ACETATE 80 MG/ML IJ SUSP
80.0000 mg | Freq: Once | INTRAMUSCULAR | Status: AC
  Administered 2016-02-22: 80 mg via INTRAMUSCULAR

## 2016-02-22 MED ORDER — NAPROXEN 375 MG PO TBEC: DELAYED_RELEASE_TABLET | ORAL | Status: DC

## 2016-02-22 MED ORDER — PREDNISONE 5 MG (21) PO TBPK: 5.0000 mg | ORAL_TABLET | ORAL | Status: DC

## 2016-02-22 NOTE — Assessment & Plan Note (Signed)
3 month h/o progressive and disabling joint pain and swelling involving spine, elbows, knees, ankles and hands, multiple tender spots Uncontrolled.Toradol and depo medrol administered IM in the office , to be followed by a short course of oral prednisone and NSAIDS.

## 2016-02-22 NOTE — Progress Notes (Signed)
Subjective:    Patient ID: Hannah Hays, female    DOB: [birth year], 50 y.o.   MRN: [id number]  HPI   Hannah Hays     MRN: [id number]      DOB: January 21, 1966   HPI Hannah Hays is here for follow up and re-evaluation of chronic medical conditions, medication management and review of any available recent lab and radiology data.  Preventive health is updated, specifically  Cancer screening and Immunization.   Questions or concerns regarding consultations or procedures which the PT has had in the interim are  addressed. The PT denies any adverse reactions to current medications since the last visit.  3 month h/o generalized pain , debility and joint deformity. Spine pain  is worse in supine position, no comfortable position lying down, poor sleep, difficulty standing, no radiation to legs. Bilateral knee pain and popping, also ankle pain and swelling, elbows and hands hurt unable to wear rings, deformity of joints  ROS Denies recent fever or chills. Denies sinus pressure, nasal congestion, ear pain or sore throat. Denies chest congestion, productive cough or wheezing. Denies chest pains, palpitations and leg swelling Denies abdominal pain, nausea, vomiting,diarrhea or constipation.   Denies dysuria, frequency, hesitancy or incontinence. . Denies headaches, seizures, numbness, or tingling.  Denies skin break down or rash.   PE  BP 124/80 mmHg  Pulse 87  Resp 16  Ht _0  (1.676 m)  Wt 149 lb (67.586 kg)  BMI 24.06 kg/m2  SpO2 97%  Patient alert and oriented and in no cardiopulmonary distress.  HEENT: No facial asymmetry, EOMI,   oropharynx pink and moist.  Neck decreased ROM no JVD, no mass.  Chest: Clear to auscultation bilaterally.  CVS: S1, S2 no murmurs, no S3.Regular rate.  ABD: Soft non tender.   Ext: No edema  MS: decreased  ROM spine, shoulders, hips and knees.Deformity and swelling of IP joints of most digits  Skin: Intact, no ulcerations or rash  noted.  Psych: Good eye contact, normal affect. Memory intact not anxious or depressed appearing.  CNS: CN 2-12 intact, power,  normal throughout.no focal deficits noted.   Assessment & Plan   Generalized pain  3 month h/o progressive and disabling joint pain and swelling involving spine, elbows, knees, ankles and hands, multiple tender spots Uncontrolled.Toradol and depo medrol administered IM in the office , to be followed by a short course of oral prednisone and NSAIDS.   HTN (hypertension) Controlled, no change in medication DASH diet and commitment to daily physical activity for a minimum of 30 minutes discussed and encouraged, as a part of hypertension management. The importance of attaining a healthy weight is also discussed.  BP/Weight 02/22/2016 02/02/2016 12/21/2015 09/23/2015 08/16/2015 06/10/2015 5/63/1497  Systolic BP 026 378 588 502 774 128 786  Diastolic BP 80 49 96 84 55 80 84  Wt. (Lbs) 149 147 148.4 150 143.13 135.04 133.12  BMI 24.06 24.08 24.7 24.96 23.82 22.47 22.15        Asthma Controlled, no change in medication   IGT (impaired glucose tolerance) Patient educated about the importance of limiting  Carbohydrate intake , the need to commit to daily physical activity for a minimum of 30 minutes , and to commit weight loss. The fact that changes in all these areas will reduce or eliminate all together the development of diabetes is stressed.   Diabetic Labs Latest Ref Rng 09/16/2015 08/12/2015 02/11/2015 03/03/2014 07/10/2013  HbA1c <5.7 % - - 5.7(H) - 5.3  Chol - 230 - 223(H) 220(H) -  HDL >=46 mg/dL - - 55 59 -  Calc LDL 0 - 99 mg/dL - - 152(H) 150(H) -  Triglycerides <150 mg/dL - - 79 53 -  Creatinine 0.44 - 1.00 mg/dL - 0.71 0.75 0.72 0.84   BP/Weight 02/22/2016 02/02/2016 12/21/2015 09/23/2015 08/16/2015 06/10/2015 01/30/7374  Systolic BP 051 071 252 479 980 012 393  Diastolic BP 80 49 96 84 55 80 84  Wt. (Lbs) 149 147 148.4 150 143.13 135.04 133.12  BMI 24.06  24.08 24.7 24.96 23.82 22.47 22.15   No flowsheet data found.     Dyslipidemia Hyperlipidemia:Low fat diet discussed and encouraged.   Lipid Panel  Lab Results  Component Value Date   CHOL 230 09/16/2015   HDL 55 02/11/2015   LDLCALC 152* 02/11/2015   TRIG 79 02/11/2015   CHOLHDL 4.1 02/11/2015      Updated lab needed at/ before next visit.   Chronic nonallergic rhinitis Controlled, no change in medication   Joint pain Polyarticular joint pain involving small and large joints, increasing in severity and debilitating, needs rheumatology eval, ESR today Uncontrolled.Toradol and depo medrol administered IM in the office , to be followed by a short course of oral prednisone and NSAIDS.         Review of Systems     Objective:   Physical Exam        Assessment & Plan:

## 2016-02-22 NOTE — Patient Instructions (Signed)
F/u in  3 month, call if you need me sooner  You are referred to rheumatology as soon as possible   Injections in office and naproxen and prednisone sent for pain  STOP ibuprofen, instead use naproxen which  is sent in  Labs today.  Cyst on upper back is not cancerous, it is a sebaceous cyst,  to get rid of it you will need it removed  Hope you feel better soon  Thanks for choosing [facility], we consider it a privelige to serve you.

## 2016-02-22 NOTE — Assessment & Plan Note (Signed)
Hyperlipidemia:Low fat diet discussed and encouraged.   Lipid Panel  Lab Results  Component Value Date   CHOL 230 09/16/2015   HDL 55 02/11/2015   LDLCALC 152* 02/11/2015   TRIG 79 02/11/2015   CHOLHDL 4.1 02/11/2015      Updated lab needed at/ before next visit.

## 2016-02-22 NOTE — Assessment & Plan Note (Signed)
Controlled, no change in medication  

## 2016-02-22 NOTE — Assessment & Plan Note (Signed)
Patient educated about the importance of limiting  Carbohydrate intake , the need to commit to daily physical activity for a minimum of 30 minutes , and to commit weight loss. The fact that changes in all these areas will reduce or eliminate all together the development of diabetes is stressed.   Diabetic Labs Latest Ref Rng 09/16/2015 08/12/2015 02/11/2015 03/03/2014 07/10/2013  HbA1c <5.7 % - - 5.7(H) - 5.3  Chol - 230 - 223(H) 220(H) -  HDL >=46 mg/dL - - 55 59 -  Calc LDL 0 - 99 mg/dL - - 152(H) 150(H) -  Triglycerides <150 mg/dL - - 79 53 -  Creatinine 0.44 - 1.00 mg/dL - 0.71 0.75 0.72 0.84   BP/Weight 02/22/2016 02/02/2016 12/21/2015 09/23/2015 08/16/2015 06/10/2015 Q000111Q  Systolic BP A999333 [id number] 0000000 Q000111Q Q000111Q Q000111Q A999333  Diastolic BP 80 49 96 84 55 80 84  Wt. (Lbs) 149 147 148.4 150 143.13 135.04 133.12  BMI 24.06 24.08 24.7 24.96 23.82 22.47 22.15   No flowsheet data found.

## 2016-02-22 NOTE — Assessment & Plan Note (Signed)
Controlled, no change in medication DASH diet and commitment to daily physical activity for a minimum of 30 minutes discussed and encouraged, as a part of hypertension management. The importance of attaining a healthy weight is also discussed.  BP/Weight 02/22/2016 02/02/2016 12/21/2015 09/23/2015 08/16/2015 06/10/2015 Q000111Q  Systolic BP A999333 [id number] 0000000 Q000111Q Q000111Q Q000111Q A999333  Diastolic BP 80 49 96 84 55 80 84  Wt. (Lbs) 149 147 148.4 150 143.13 135.04 133.12  BMI 24.06 24.08 24.7 24.96 23.82 22.47 22.15

## 2016-02-23 LAB — SEDIMENTATION RATE: Sed Rate: 28 mm/hr — ABNORMAL HIGH (ref 0–20)

## 2016-02-23 LAB — VITAMIN D 25 HYDROXY (VIT D DEFICIENCY, FRACTURES): Vit D, 25-Hydroxy: 38 ng/mL (ref 30–100)

## 2016-02-25 LAB — ANEMIA PANEL
%SAT: 13 % (ref 11–50)
ABS RETIC: 45.7 10*3/uL (ref 19.0–186.0)
FOLATE: 11.5 ng/mL (ref >5.4)
Ferritin: 72 ng/mL (ref 10–232)
Iron: 43 ug/dL — ABNORMAL LOW (ref 45–160)
RBC.: 4.15 MIL/uL (ref 3.87–5.11)
RETIC CT PCT: 1.1 % (ref 0.4–2.3)
TIBC: 327 ug/dL (ref 250–450)
UIBC: 284 ug/dL (ref 125–400)
Vitamin B-12: 317 pg/mL (ref 200–1100)

## 2016-02-26 DIAGNOSIS — M2550 Pain in unspecified joint: Secondary | ICD-10-CM | POA: Insufficient documentation

## 2016-02-26 NOTE — Assessment & Plan Note (Addendum)
Polyarticular joint pain involving small and large joints, increasing in severity and debilitating, needs rheumatology eval, ESR today Uncontrolled.Toradol and depo medrol administered IM in the office , to be followed by a short course of oral prednisone and NSAIDS.

## 2016-02-26 NOTE — Assessment & Plan Note (Signed)
Controlled, no change in medication  

## 2016-02-28 ENCOUNTER — Telehealth: Payer: Self-pay | Admitting: Family Medicine

## 2016-02-28 NOTE — Telephone Encounter (Signed)
Patient is calling stating that Dr. Moshe Cipro wanted her to take   Naproxen 375 MG TBEC 2 a day for 2 wks and she only got 1 wk dose, please advise?

## 2016-02-29 ENCOUNTER — Other Ambulatory Visit: Payer: Self-pay

## 2016-02-29 DIAGNOSIS — R52 Pain, unspecified: Secondary | ICD-10-CM

## 2016-02-29 MED ORDER — NAPROXEN 375 MG PO TBEC: DELAYED_RELEASE_TABLET | ORAL | Status: DC

## 2016-02-29 NOTE — Telephone Encounter (Signed)
Wrong quantity was initially sent in.  Will send in additional quantity.

## 2016-03-13 ENCOUNTER — Other Ambulatory Visit: Payer: Self-pay | Admitting: Family Medicine

## 2016-03-22 ENCOUNTER — Other Ambulatory Visit: Payer: Self-pay | Admitting: Family Medicine

## 2016-03-22 DIAGNOSIS — Z1231 Encounter for screening mammogram for malignant neoplasm of breast: Secondary | ICD-10-CM

## 2016-03-30 ENCOUNTER — Ambulatory Visit (HOSPITAL_COMMUNITY)
Admission: RE | Admit: 2016-03-30 | Discharge: 2016-03-30 | Disposition: A | Discharge status: Home / Self Care | Payer: BLUE CROSS/BLUE SHIELD | Source: Ambulatory Visit | Attending: Family Medicine | Admitting: Family Medicine

## 2016-03-30 DIAGNOSIS — Z1231 Encounter for screening mammogram for malignant neoplasm of breast: Secondary | ICD-10-CM | POA: Insufficient documentation

## 2016-04-05 ENCOUNTER — Other Ambulatory Visit: Payer: Self-pay | Admitting: Family Medicine

## 2016-05-24 ENCOUNTER — Ambulatory Visit (INDEPENDENT_AMBULATORY_CARE_PROVIDER_SITE_OTHER): Payer: BLUE CROSS/BLUE SHIELD | Admitting: Family Medicine

## 2016-05-24 ENCOUNTER — Encounter: Payer: Self-pay | Admitting: Family Medicine

## 2016-05-24 VITALS — BP 134/84 | HR 66 | Resp 16 | Ht 66.0 in | Wt 147.0 lb

## 2016-05-24 DIAGNOSIS — E559 Vitamin D deficiency, unspecified: Secondary | ICD-10-CM

## 2016-05-24 DIAGNOSIS — I10 Essential (primary) hypertension: Secondary | ICD-10-CM | POA: Diagnosis not present

## 2016-05-24 DIAGNOSIS — E785 Hyperlipidemia, unspecified: Secondary | ICD-10-CM

## 2016-05-24 DIAGNOSIS — J4530 Mild persistent asthma, uncomplicated: Secondary | ICD-10-CM

## 2016-05-24 DIAGNOSIS — R52 Pain, unspecified: Secondary | ICD-10-CM

## 2016-05-24 DIAGNOSIS — J310 Chronic rhinitis: Secondary | ICD-10-CM

## 2016-05-24 DIAGNOSIS — F329 Major depressive disorder, single episode, unspecified: Secondary | ICD-10-CM

## 2016-05-24 DIAGNOSIS — R7302 Impaired glucose tolerance (oral): Secondary | ICD-10-CM | POA: Diagnosis not present

## 2016-05-24 DIAGNOSIS — F32A Depression, unspecified: Secondary | ICD-10-CM

## 2016-05-24 NOTE — Patient Instructions (Signed)
F/u 2nd week in November, call if you need me before  Thankful that you feel better   Fasting chem 7, CBC and vit D 1 week before follow up  Exam is good , no med change

## 2016-05-27 NOTE — Assessment & Plan Note (Signed)
Continue supplement as before

## 2016-05-27 NOTE — Assessment & Plan Note (Signed)
Controlled, no change in medication  

## 2016-05-27 NOTE — Assessment & Plan Note (Signed)
Controlled, no change in medication DASH diet and commitment to daily physical activity for a minimum of 30 minutes discussed and encouraged, as a part of hypertension management. The importance of attaining a healthy weight is also discussed.  BP/Weight 05/24/2016 02/22/2016 02/02/2016 12/21/2015 09/23/2015 [id number] A999333  Systolic BP Q000111Q A999333 99991111 0000000 Q000111Q Q000111Q Q000111Q  Diastolic BP 84 80 49 96 84 55 80  Wt. (Lbs) 147 149 147 148.4 150 143.13 135.04  BMI 23.74 24.06 24.08 24.7 24.96 23.82 22.47

## 2016-05-27 NOTE — Assessment & Plan Note (Signed)
Patient educated about the importance of limiting  Carbohydrate intake , the need to commit to daily physical activity for a minimum of 30 minutes , and to commit weight loss. The fact that changes in all these areas will reduce or eliminate all together the development of diabetes is stressed.   Diabetic Labs Latest Ref Rng 02/22/2016 09/16/2015 08/12/2015 02/11/2015 03/03/2014  HbA1c <5.7 % - - - 5.7(H) -  Chol 125 - 200 mg/dL 191 230 - 223(H) 220(H)  HDL >=46 mg/dL 50 - - 55 59  Calc LDL <130 mg/dL 128 - - 152(H) 150(H)  Triglycerides <150 mg/dL 67 - - 79 53  Creatinine 0.50 - 1.05 mg/dL 0.79 - 0.71 0.75 0.72   BP/Weight 05/24/2016 02/22/2016 02/02/2016 12/21/2015 09/23/2015 [id number]  Systolic BP Q000111Q A999333 99991111 0000000 Q000111Q Q000111Q Q000111Q  Diastolic BP 84 80 49 96 84 55 80  Wt. (Lbs) 147 149 147 148.4 150 143.13 135.04  BMI 23.74 24.06 24.08 24.7 24.96 23.82 22.47   No flowsheet data found.

## 2016-05-27 NOTE — Progress Notes (Signed)
Hannah Hays     MRN: [id number]      DOB: March 31, 1966   HPI Hannah Hays is here for follow up and re-evaluation of chronic medical conditions, medication management and review of any available recent lab and radiology data.  Preventive health is updated, specifically  Cancer screening and Immunization.   Questions or concerns regarding consultations or procedures which the PT has had in the interim are  Addressed.Improved joint pain and on no medication for this at this time, will f/u with rheumatology The PT denies any adverse reactions to current medications since the last visit.  There are no new concerns.  There are no specific complaints   ROS Denies recent fever or chills. Denies sinus pressure, nasal congestion, ear pain or sore throat. Denies chest congestion, productive cough or wheezing. Denies chest pains, palpitations and leg swelling Denies abdominal pain, nausea, vomiting,diarrhea or constipation.   Denies dysuria, frequency, hesitancy or incontinence. Denies joint pain, swelling and limitation in mobility. Denies headaches, seizures, numbness, or tingling. Denies depression, anxiety or insomnia. Denies skin break down or rash.   PE  BP 134/84 mmHg  Pulse 66  Resp 16  Ht 5\' 6"  (1.676 m)  Wt 147 lb (66.679 kg)  BMI 23.74 kg/m2  SpO2 99%  Patient alert and oriented and in no cardiopulmonary distress.  HEENT: No facial asymmetry, EOMI,   oropharynx pink and moist.  Neck supple no JVD, no mass.  Chest: Clear to auscultation bilaterally.  CVS: S1, S2 no murmurs, no S3.Regular rate.  ABD: Soft non tender.   Ext: No edema  MS: Adequate ROM spine, shoulders, hips and knees.  Skin: Intact, no ulcerations or rash noted.  Psych: Good eye contact, normal affect. Memory intact not anxious or depressed appearing.  CNS: CN 2-12 intact, power,  normal throughout.no focal deficits noted.   Assessment & Plan   HTN (hypertension) Controlled, no change in  medication DASH diet and commitment to daily physical activity for a minimum of 30 minutes discussed and encouraged, as a part of hypertension management. The importance of attaining a healthy weight is also discussed.  BP/Weight 05/24/2016 02/22/2016 02/02/2016 12/21/2015 09/23/2015 [id number]  Systolic BP Q000111Q A999333 [id number] 0000000 Q000111Q Q000111Q Q000111Q  Diastolic BP 84 80 49 96 84 55 80  Wt. (Lbs) 147 149 147 148.4 150 143.13 135.04  BMI 23.74 24.06 24.08 24.7 24.96 23.82 22.47        IGT (impaired glucose tolerance) Patient educated about the importance of limiting  Carbohydrate intake , the need to commit to daily physical activity for a minimum of 30 minutes , and to commit weight loss. The fact that changes in all these areas will reduce or eliminate all together the development of diabetes is stressed.   Diabetic Labs Latest Ref Rng 02/22/2016 09/16/2015 08/12/2015 02/11/2015 03/03/2014  HbA1c <5.7 % - - - 5.7(H) -  Chol 125 - 200 mg/dL 191 230 - 223(H) 220(H)  HDL >=46 mg/dL 50 - - 55 59  Calc LDL <130 mg/dL 128 - - 152(H) 150(H)  Triglycerides <150 mg/dL 67 - - 79 53  Creatinine 0.50 - 1.05 mg/dL 0.79 - 0.71 0.75 0.72   BP/Weight 05/24/2016 02/22/2016 02/02/2016 12/21/2015 09/23/2015 [id number]  Systolic BP Q000111Q A999333 99991111 0000000 Q000111Q Q000111Q Q000111Q  Diastolic BP 84 80 49 96 84 55 80  Wt. (Lbs) 147 149 147 148.4 150 143.13 135.04  BMI 23.74 24.06 24.08 24.7 24.96 23.82 22.47   No flowsheet data  found.     Generalized pain Marked improvement, followed by rheumatology  Depression Controlled, no change in medication   Asthma Controlled, no change in medication   Chronic nonallergic rhinitis Controlled, no change in medication   Vitamin D deficiency Continue supplement as before

## 2016-05-27 NOTE — Assessment & Plan Note (Signed)
Marked improvement, followed by rheumatology

## 2016-06-26 ENCOUNTER — Other Ambulatory Visit: Payer: Self-pay | Admitting: Family Medicine

## 2016-06-26 DIAGNOSIS — N951 Menopausal and female climacteric states: Secondary | ICD-10-CM

## 2016-06-30 ENCOUNTER — Telehealth: Payer: Self-pay | Admitting: Family Medicine

## 2016-06-30 ENCOUNTER — Other Ambulatory Visit: Payer: Self-pay

## 2016-06-30 MED ORDER — MOMETASONE FURO-FORMOTEROL FUM 200-5 MCG/ACT IN AERO: 2.0000 | INHALATION_SPRAY | Freq: Two times a day (BID) | RESPIRATORY_TRACT | 3 refills | Status: DC

## 2016-06-30 NOTE — Telephone Encounter (Signed)
Hannah Hays is calling asking if Dr. Moshe Cipro would refill her medication on mometasone-formoterol (DULERA) 200-5 MCG/ACT AERO

## 2016-06-30 NOTE — Telephone Encounter (Signed)
Med refilled.

## 2016-07-13 ENCOUNTER — Other Ambulatory Visit: Payer: Self-pay | Admitting: Gastroenterology

## 2016-08-22 ENCOUNTER — Ambulatory Visit (INDEPENDENT_AMBULATORY_CARE_PROVIDER_SITE_OTHER): Payer: BLUE CROSS/BLUE SHIELD | Admitting: Allergy & Immunology

## 2016-08-22 ENCOUNTER — Ambulatory Visit: Payer: Self-pay | Admitting: Allergy & Immunology

## 2016-08-22 ENCOUNTER — Encounter: Payer: Self-pay | Admitting: Allergy & Immunology

## 2016-08-22 VITALS — BP 130/80 | HR 60 | Temp 97.7°F

## 2016-08-22 DIAGNOSIS — J310 Chronic rhinitis: Secondary | ICD-10-CM | POA: Diagnosis not present

## 2016-08-22 DIAGNOSIS — J4540 Moderate persistent asthma, uncomplicated: Secondary | ICD-10-CM | POA: Diagnosis not present

## 2016-08-22 MED ORDER — MOMETASONE FURO-FORMOTEROL FUM 200-5 MCG/ACT IN AERO: 2.0000 | INHALATION_SPRAY | Freq: Two times a day (BID) | RESPIRATORY_TRACT | 5 refills | Status: DC

## 2016-08-22 MED ORDER — MONTELUKAST SODIUM 10 MG PO TABS: 10.0000 mg | ORAL_TABLET | Freq: Every day | ORAL | 5 refills | Status: DC

## 2016-08-22 MED ORDER — BECLOMETHASONE DIPROPIONATE 80 MCG/ACT NA AERS: 2.0000 | INHALATION_SPRAY | Freq: Two times a day (BID) | NASAL | 5 refills | Status: DC

## 2016-08-22 NOTE — Progress Notes (Signed)
FOLLOW UP  Date of Service/Encounter:  08/22/16   Assessment:   Moderate persistent asthma, uncomplicated  Chronic rhinitis   Asthma Reportables:  Severity: moderate persistent  Risk: low Control: well controlled  Seasonal Influenza Vaccine: no but encouraged    Plan/Recommendations:   1. Moderate persistent asthma, uncomplicated - Continue Dulera two puffs in the morning and two puffs at night. - Use a spacer for the best distribution into the lungs. - Use ProAir 4 puffs every 4-6 hours as needed for coughing/wheezing.  2. Chronic rhinitis - Continue with Qnasl two sprays per nostril daily. - No need for further interventions at this time. - No sensitivity to NSAIDs, therefore AERD unlikely. - Follow up with Dr. Benjamine Mola as scheduled.  3. Return in about 6 months (around 02/19/2017).   Subjective:   Hannah Hays is a 50 y.o. female presenting today for follow up of  Chief Complaint  Patient presents with  . Asthma    no problems  .  Hannah Hays has a history of the following: Patient Active Problem List   Diagnosis Date Noted  . Hx of adenomatous colonic polyps   . Generalized pain 12/26/2015  . Lipoma 12/26/2015  . Depression 09/23/2015  . Hot flashes, menopausal 06/10/2015  . Liver mass, left lobe 02/11/2015  . Nasal polyposis 08/20/2014  . Chronic nonallergic rhinitis 08/20/2014  . Dysphagia 04/23/2013  . Vitamin D deficiency 07/12/2012  . HTN (hypertension) 08/19/2011  . GERD (gastroesophageal reflux disease) 04/11/2011  . IBS 12/19/2010  . MIGRAINE HEADACHE 01/19/2010  . IGT (impaired glucose tolerance) 12/26/2009  . Dyslipidemia 10/13/2008  . Asthma 11/15/2007    History obtained from: chart review and patient.  Hannah Hays was referred by Tula Nakayama, MD.     Hannah Hays is a 50 y.o. female presenting for a follow up visit for asthma and allergies. She was last seen in August 2016 and was doing well at the time. She was on a  presurgical dose of prednisone at the time, as she was about to undergo a polpectomy with Dr. Benjamine Mola. Her last testing was performed in 2011 and was positive to one mold mix and dust mites.   Since the last visit, things have gone well. She had sinus surgery to remove the nasal polyps and her symptoms improved markedly. She is followed by Dr. Benjamine Mola. Currently she is seeing him every six months or so. She is on Qnasl two spray BID as well as Singulair. She continues to have anosmia. Her breathing has been well controlled on the [facility] two puffs BID. She takes ProAir RespiClick once per week but overall feels that she is doing well. She has had no ED visits or UC visits for breathing problems. She has not been on a prednisone in over one year. She does not have a problem with NSAID sensitivity and takes ibuprofen all of the time for her arthritis.   Otherwise, there have been no changes to the past medical history, surgical history, family history, or social history.     Review of Systems: a 14-point review of systems is pertinent for what is mentioned in HPI.  Otherwise, all other systems were negative. Constitutional: negative other than that listed in the HPI Eyes: negative other than that listed in the HPI Ears, nose, mouth, throat, and face: negative other than that listed in the HPI Respiratory: negative other than that listed in the HPI Cardiovascular: negative other than that listed in the HPI Gastrointestinal: negative  other than that listed in the HPI Genitourinary: negative other than that listed in the HPI Integument: negative other than that listed in the HPI Hematologic: negative other than that listed in the HPI Musculoskeletal: negative other than that listed in the HPI Neurological: negative other than that listed in the HPI Allergy/Immunologic: negative other than that listed in the HPI    Objective:   Blood pressure 130/80, pulse 60, temperature 97.7 F (36.5 C), temperature  source Oral, SpO2 98 %. There is no height or weight on file to calculate BMI.   Physical Exam:  General: Alert, interactive, in no acute distress. Cooperative with the exam. Smiling and pleasant female.  HEENT: TMs pearly gray, turbinates minimally edematous with clear discharge, post-pharynx erythematous. Neck: Supple without thyromegaly. Adenopathy: no enlarged lymph nodes appreciated in the anterior cervical, occipital, axillary, epitrochlear, inguinal, or popliteal regions Lungs: Clear to auscultation without wheezing, rhonchi or rales. No increased work of breathing. CV: Normal S1, S2 without murmurs. Capillary refill <2 seconds.  Skin: Warm and dry, without lesions or rashes. Extremities:  No clubbing, cyanosis or edema. Neuro:   Grossly intact. No focal deficits noted.   Diagnostic studies:  Spirometry: results normal (FEV1: 1.81/70%, FVC: 2.46/81%, FEV1/FVC: 73%).    Spirometry consistent with normal pattern      [name], MD [place] [facility] [place] [facility] [place]

## 2016-08-22 NOTE — Patient Instructions (Addendum)
1. Moderate persistent asthma, uncomplicated - Continue Dulera two puffs in the morning and two puffs at night. - Use a spacer for the best distribution into the lungs. - Use ProAir 4 puffs every 4-6 hours as needed for coughing/wheezing.  2. Chronic rhinitis - Continue with Qnasl two sprays per nostril daily. - No need for further interventions at this time. - No sensitivity to NSAIDs, therefore AERD unlikely. - Follow up with Dr. Benjamine Mola as scheduled.  3. Return in about 6 months (around 02/19/2017).  Please inform us of any Emergency Department visits, hospitalizations, or changes in symptoms. Call us before going to the ED for breathing or allergy symptoms since we might be able to fit you in for a sick visit. Feel free to contact us anytime with any questions, problems, or concerns.  It was a pleasure to meet you today!

## 2016-10-11 ENCOUNTER — Encounter: Payer: Self-pay | Admitting: Family Medicine

## 2016-10-11 ENCOUNTER — Ambulatory Visit (INDEPENDENT_AMBULATORY_CARE_PROVIDER_SITE_OTHER): Payer: BLUE CROSS/BLUE SHIELD | Admitting: Family Medicine

## 2016-10-11 VITALS — BP 142/90 | HR 79 | Resp 17 | Ht 66.0 in | Wt 150.0 lb

## 2016-10-11 DIAGNOSIS — J4520 Mild intermittent asthma, uncomplicated: Secondary | ICD-10-CM

## 2016-10-11 DIAGNOSIS — R7302 Impaired glucose tolerance (oral): Secondary | ICD-10-CM

## 2016-10-11 DIAGNOSIS — E785 Hyperlipidemia, unspecified: Secondary | ICD-10-CM | POA: Diagnosis not present

## 2016-10-11 DIAGNOSIS — I10 Essential (primary) hypertension: Secondary | ICD-10-CM

## 2016-10-11 MED ORDER — IBUPROFEN 800 MG PO TABS: 800.0000 mg | ORAL_TABLET | Freq: Three times a day (TID) | ORAL | 0 refills | Status: DC | PRN

## 2016-10-11 NOTE — Assessment & Plan Note (Signed)
Uncontrolled, no med change DASH diet and commitment to daily physical activity for a minimum of 30 minutes discussed and encouraged, as a part of hypertension management. The importance of attaining a healthy weight is also discussed.  BP/Weight 10/11/2016 08/22/2016 05/24/2016 02/22/2016 02/02/2016 12/21/2015 [id number]  Systolic BP A999333 [id number] Q000111Q A999333 [id number] 0000000 Q000111Q  Diastolic BP 90 80 84 80 49 96 84  Wt. (Lbs) 150 - 147 149 147 148.4 150  BMI 24.21 - 23.74 24.06 24.08 24.7 24.96

## 2016-10-11 NOTE — Patient Instructions (Signed)
F/u in 4 month, call if you need me before  Blood pressure, cholesterol and blood sugar are high  Change eating as discussed, to fruit and vegetable 80% and also commit to exercise for at least  30 minutes every day  Ibuprofen refilled.  Wax is starting to build up oin right ear causing the discomfort, call fro ENT to flush when ready  Please work on good  health habits so that your health will improve. 1. Commitment to daily physical activity for 30 to 60  minutes, if you are able to do this.  2. Commitment to wise food choices. Aim for half of your  food intake to be vegetable and fruit, one quarter starchy foods, and one quarter protein. Try to eat on a regular schedule  3 meals per day, snacking between meals should be limited to vegetables or fruits or small portions of nuts. 64 ounces of water per day is generally recommended, unless you have specific health conditions, like heart failure or kidney failure where you will need to limit fluid intake.  3. Commitment to sufficient and a  good quality of physical and mental rest daily, generally between 6 to 8 hours per day.  WITH PERSISTANCE AND PERSEVERANCE, THE IMPOSSIBLE , BECOMES THE NORM! Thank you  for choosing [facility]. We consider it a privelige to serve you.  Delivering excellent health care in a caring and  compassionate way is our goal.  Partnering with you,  so that together we can achieve this goal is our strategy.

## 2016-10-11 NOTE — Assessment & Plan Note (Signed)
Improved Patient educated about the importance of limiting  Carbohydrate intake , the need to commit to daily physical activity for a minimum of 30 minutes , and to commit weight loss. The fact that changes in all these areas will reduce or eliminate all together the development of diabetes is stressed.   Diabetic Labs Latest Ref Rng & Units 02/22/2016 09/16/2015 08/12/2015 02/11/2015 03/03/2014  HbA1c <5.7 % - - - 5.7(H) -  Chol 125 - 200 mg/dL 191 230 - 223(H) 220(H)  HDL >=46 mg/dL 50 - - 55 59  Calc LDL <130 mg/dL 128 - - 152(H) 150(H)  Triglycerides <150 mg/dL 67 - - 79 53  Creatinine 0.50 - 1.05 mg/dL 0.79 - 0.71 0.75 0.72   BP/Weight 10/11/2016 08/22/2016 05/24/2016 02/22/2016 02/02/2016 12/21/2015 [id number]  Systolic BP A999333 [id number] Q000111Q A999333 99991111 0000000 Q000111Q  Diastolic BP 90 80 84 80 49 96 84  Wt. (Lbs) 150 - 147 149 147 148.4 150  BMI 24.21 - 23.74 24.06 24.08 24.7 24.96   No flowsheet data found.

## 2016-10-11 NOTE — Assessment & Plan Note (Signed)
Controlled, no change in medication  

## 2016-10-11 NOTE — Progress Notes (Signed)
Hannah Hays     MRN: [id number]      DOB: May 31, 1966   HPI Ms. Writer is here for follow up and re-evaluation of chronic medical conditions, medication management and review of any available recent lab and radiology data.  Preventive health is updated, specifically  Cancer screening and Immunization.   Questions or concerns regarding consultations or procedures which the PT has had in the interim are  addressed. The PT denies any adverse reactions to current medications since the last visit.  2 week h/o right ear fullness and left elbow pain, no recent trauma, has upcoming appt with rheumatology  ROS Denies recent fever or chills. Denies sinus pressure, nasal congestion, ear pain or sore throat. Denies chest congestion, productive cough or wheezing. Denies chest pains, palpitations and leg swelling Denies abdominal pain, nausea, vomiting,diarrhea or constipation.   Denies dysuria, frequency, hesitancy or incontinence.  Denies headaches, seizures, numbness, or tingling. Denies depression, anxiety or insomnia. Denies skin break down or rash.   PE  BP (!) 142/90   Pulse 79   Resp 17   Ht 5\' 6"  (1.676 m)   Wt 150 lb (68 kg)   SpO2 98%   BMI 24.21 kg/m   Patient alert and oriented and in no cardiopulmonary distress.  HEENT: No facial asymmetry, EOMI,   oropharynx pink and moist.  Neck supple no JVD, no mass.Right tM partially occluded by cerumen, left TM clear  Chest: Clear to auscultation bilaterally.  CVS: S1, S2 no murmurs, no S3.Regular rate.  ABD: Soft non tender.   Ext: No edema  MS: Adequate ROM spine, shoulders, hips and knees. Tender over left elbow, no warmth or redness Skin: Intact, no ulcerations or rash noted.  Psych: Good eye contact, normal affect. Memory intact not anxious or depressed appearing.  CNS: CN 2-12 intact, power,  normal throughout.no focal deficits noted.   Assessment & Plan  HTN (hypertension) Uncontrolled, no med change DASH  diet and commitment to daily physical activity for a minimum of 30 minutes discussed and encouraged, as a part of hypertension management. The importance of attaining a healthy weight is also discussed.  BP/Weight 10/11/2016 08/22/2016 05/24/2016 02/22/2016 02/02/2016 12/21/2015 [id number]  Systolic BP A999333 [id number] Q000111Q A999333 [id number] 0000000 Q000111Q  Diastolic BP 90 80 84 80 49 96 84  Wt. (Lbs) 150 - 147 149 147 148.4 150  BMI 24.21 - 23.74 24.06 24.08 24.7 24.96       IGT (impaired glucose tolerance) Improved Patient educated about the importance of limiting  Carbohydrate intake , the need to commit to daily physical activity for a minimum of 30 minutes , and to commit weight loss. The fact that changes in all these areas will reduce or eliminate all together the development of diabetes is stressed.   Diabetic Labs Latest Ref Rng & Units 02/22/2016 09/16/2015 08/12/2015 02/11/2015 03/03/2014  HbA1c <5.7 % - - - 5.7(H) -  Chol 125 - 200 mg/dL 191 230 - 223(H) 220(H)  HDL >=46 mg/dL 50 - - 55 59  Calc LDL <130 mg/dL 128 - - 152(H) 150(H)  Triglycerides <150 mg/dL 67 - - 79 53  Creatinine 0.50 - 1.05 mg/dL 0.79 - 0.71 0.75 0.72   BP/Weight 10/11/2016 08/22/2016 05/24/2016 02/22/2016 02/02/2016 12/21/2015 [id number]  Systolic BP A999333 [id number] Q000111Q A999333 [id number] 0000000 Q000111Q  Diastolic BP 90 80 84 80 49 96 84  Wt. (Lbs) 150 - 147 149 147 148.4 150  BMI 24.21 - 23.74 24.06 24.08 24.7  24.96   No flowsheet data found.    Dyslipidemia Uncontrolled  Low fat diet discussd Updated lab needed at/ before next visit.   Depression Controlled, no change in medication   Asthma Controlled, no change in medication

## 2016-10-11 NOTE — Assessment & Plan Note (Signed)
Uncontrolled  Low fat diet discussd Updated lab needed at/ before next visit.

## 2016-10-15 NOTE — Assessment & Plan Note (Signed)
Controlled, no change in medication  

## 2016-10-16 IMAGING — MG MM DIGITAL SCREENING
4 series · 4 of 4 positions shown · non-contrast
Comparison: Previous exam(s).

CLINICAL DATA: Screening.

EXAM:
DIGITAL SCREENING BILATERAL MAMMOGRAM WITH CAD

[R CC]
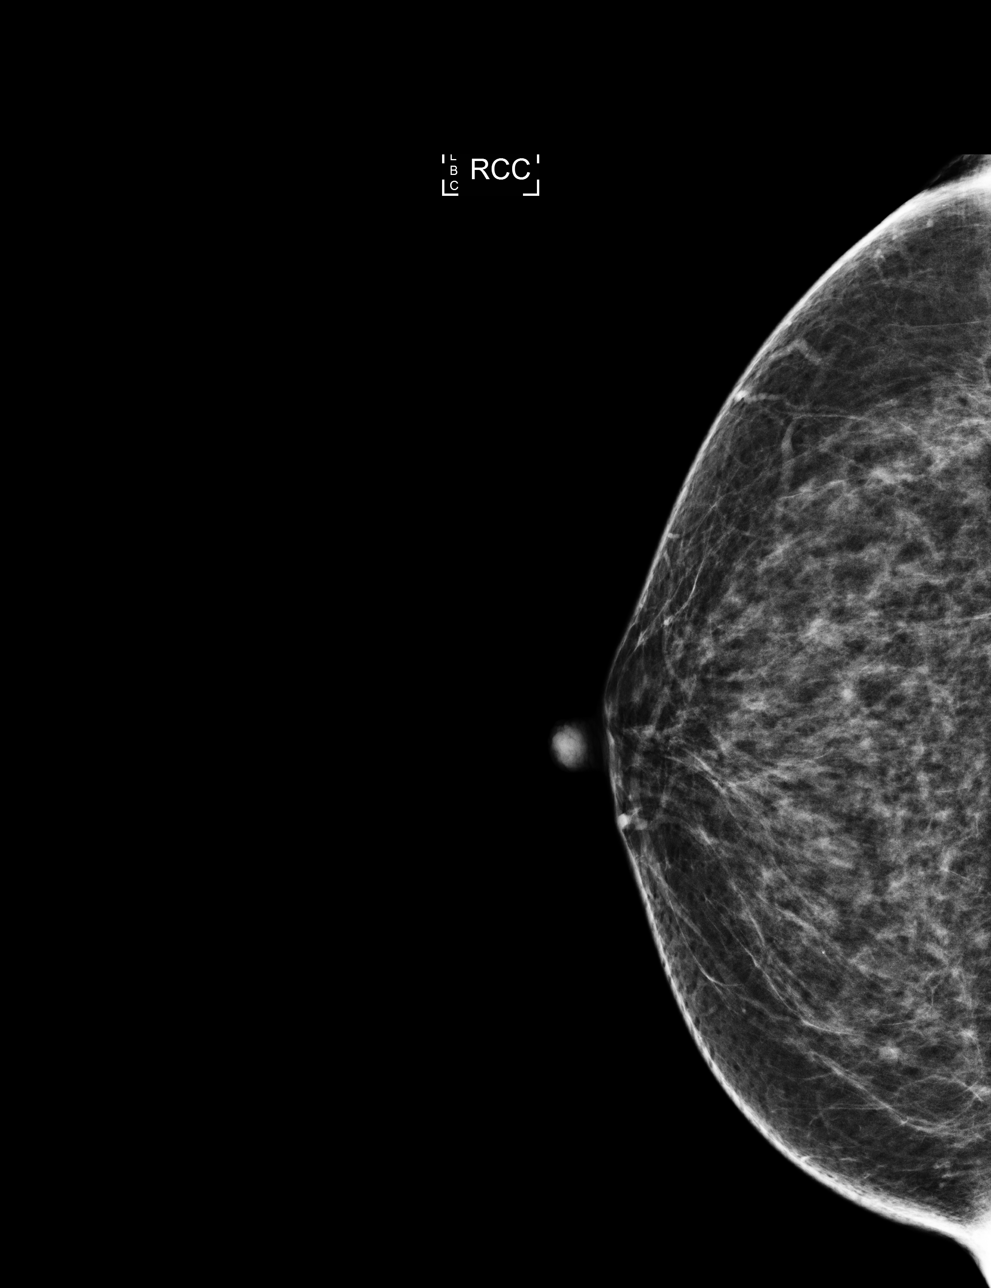

[L CC]
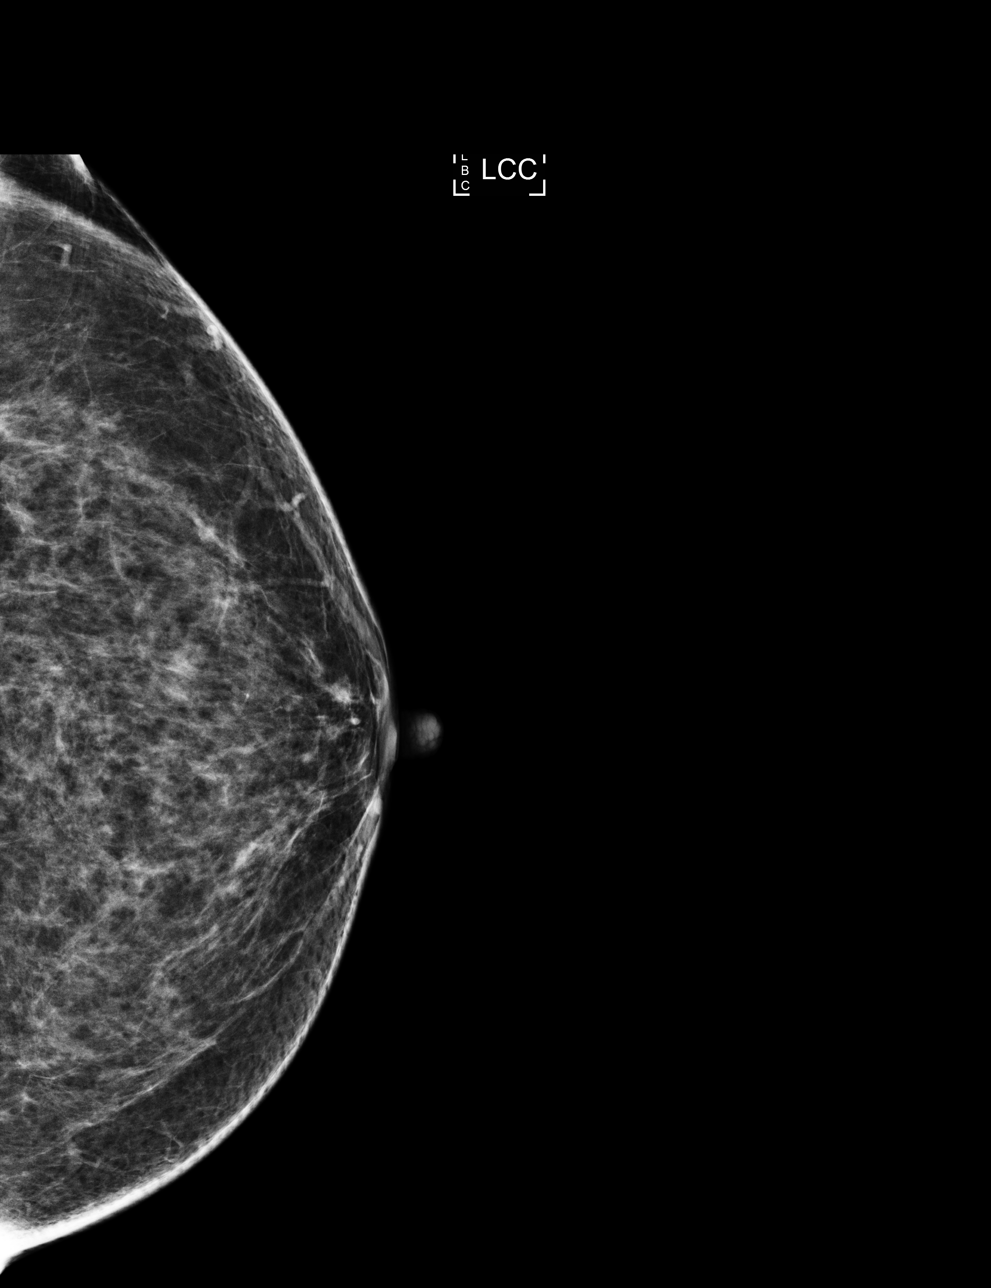

[R MLO]
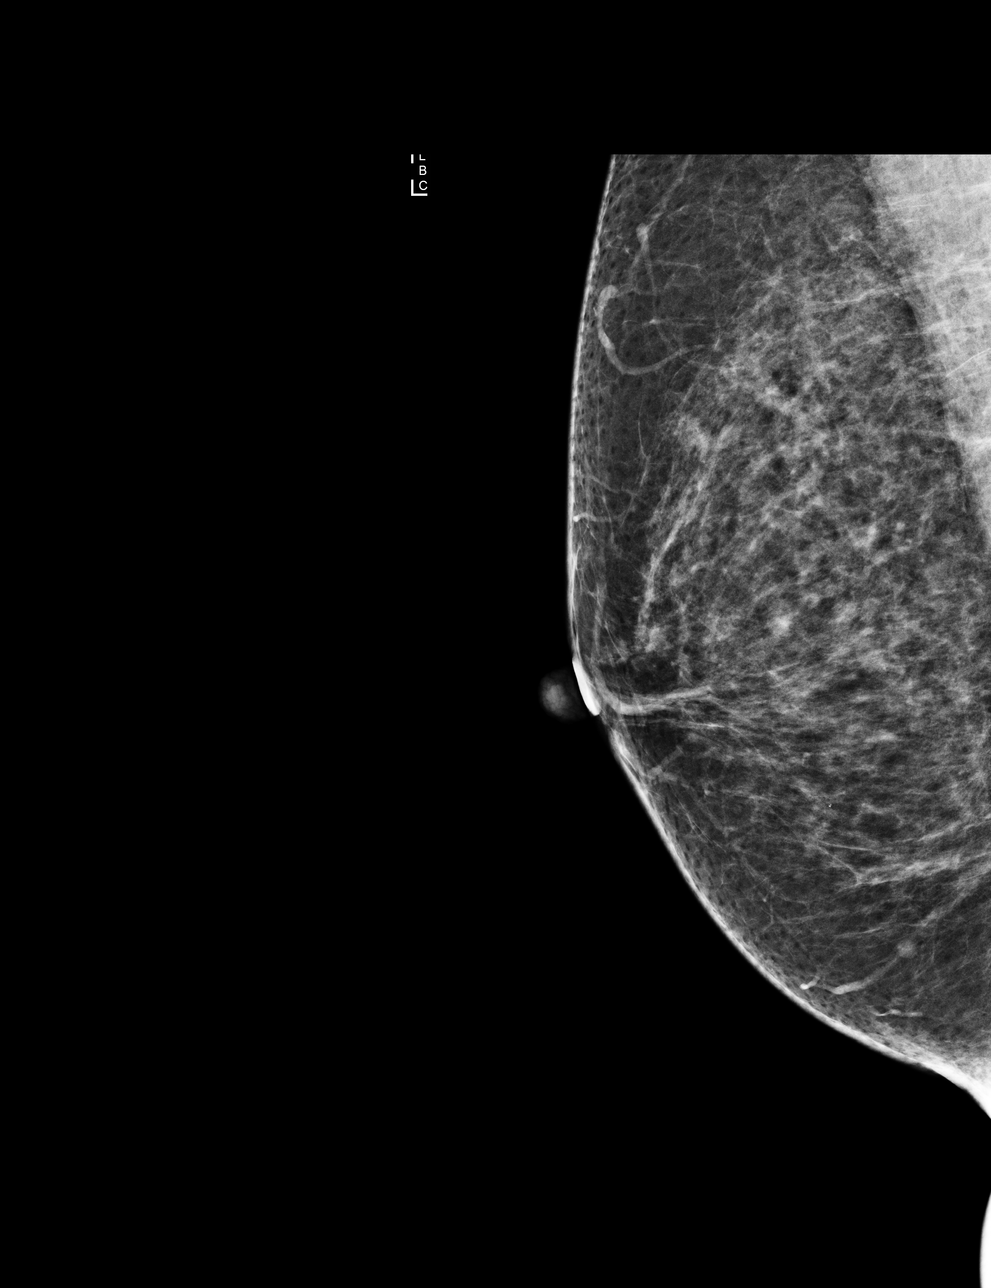

[L MLO]
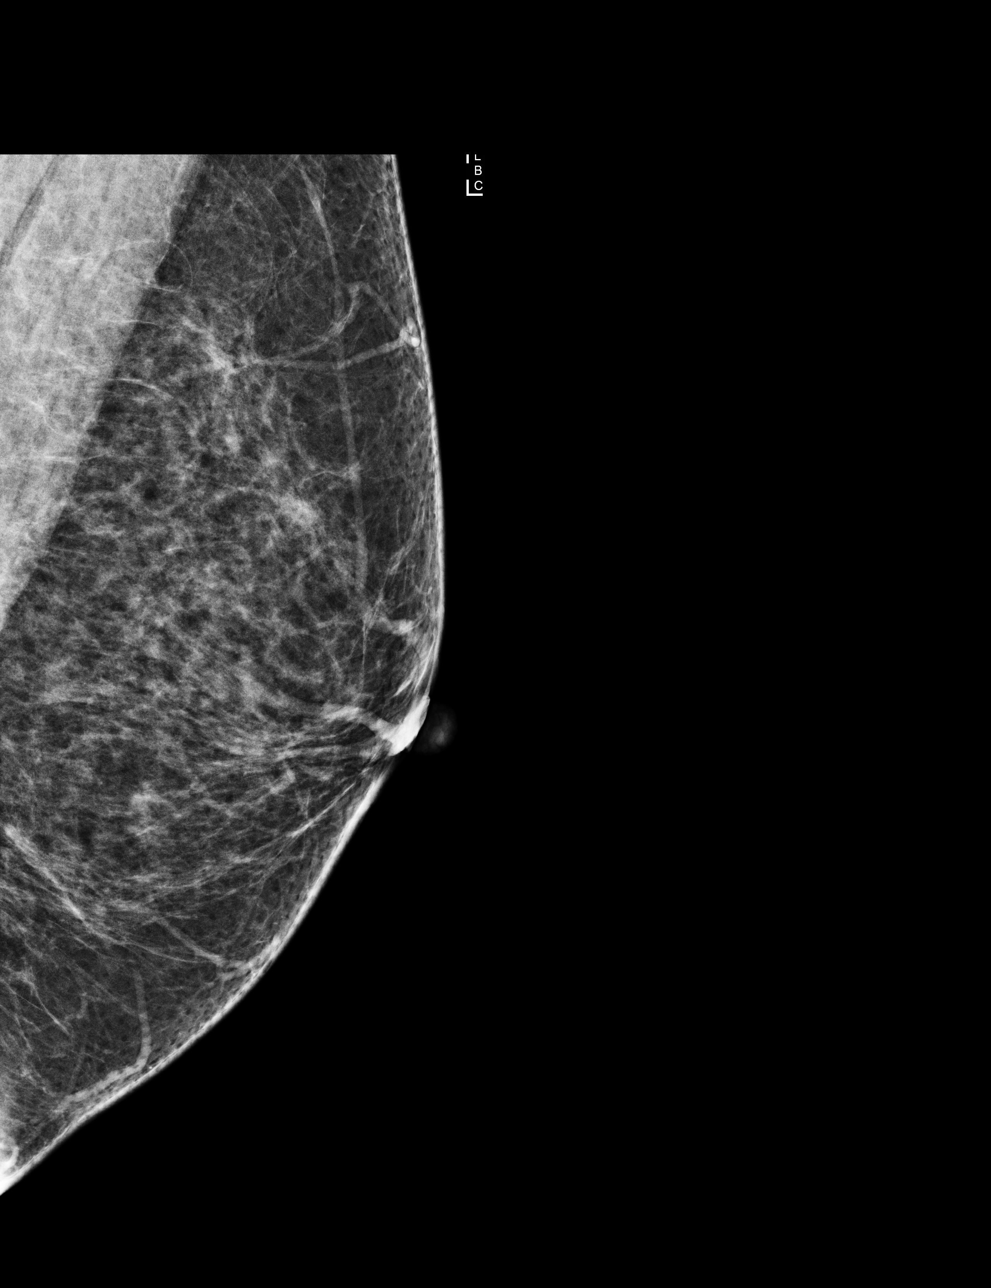

[4 of 4 positions shown; findings below may reference images not displayed]

ACR Breast Density Category c: The breast tissue is heterogeneously
dense, which may obscure small masses.
FINDINGS: There are no findings suspicious for malignancy. Images were
processed with CAD.
IMPRESSION: No mammographic evidence of malignancy. A result letter of this
screening mammogram will be mailed directly to the patient.

RECOMMENDATION:
Screening mammogram in one year. (Code:[id number])

BI-RADS CATEGORY  1: Negative.

## 2016-10-19 ENCOUNTER — Encounter: Payer: Self-pay | Admitting: Family Medicine

## 2016-11-08 ENCOUNTER — Telehealth: Payer: Self-pay | Admitting: Family Medicine

## 2016-11-08 NOTE — Telephone Encounter (Signed)
Hannah Hays is asking if we can call her in some Prednisone she states she has a lot of sinus drainage and its making her throat sore and she needs something to clear it up

## 2016-11-10 ENCOUNTER — Other Ambulatory Visit: Payer: Self-pay | Admitting: Family Medicine

## 2016-11-10 MED ORDER — PREDNISONE 5 MG (21) PO TBPK
5.0000 mg | ORAL_TABLET | ORAL | 0 refills | Status: DC
Start: 2016-11-10 — End: 2017-02-20

## 2016-11-10 NOTE — Telephone Encounter (Signed)
Please advise 

## 2016-11-10 NOTE — Telephone Encounter (Signed)
Sent tp RX pls let her know

## 2016-11-13 NOTE — Telephone Encounter (Signed)
Patient aware and has collected

## 2017-02-20 ENCOUNTER — Encounter: Payer: Self-pay | Admitting: Allergy & Immunology

## 2017-02-20 ENCOUNTER — Ambulatory Visit (INDEPENDENT_AMBULATORY_CARE_PROVIDER_SITE_OTHER): Payer: BLUE CROSS/BLUE SHIELD | Admitting: Allergy & Immunology

## 2017-02-20 VITALS — BP 120/74 | HR 55 | Temp 97.5°F | Wt 142.6 lb

## 2017-02-20 DIAGNOSIS — J3089 Other allergic rhinitis: Secondary | ICD-10-CM

## 2017-02-20 DIAGNOSIS — J4540 Moderate persistent asthma, uncomplicated: Secondary | ICD-10-CM

## 2017-02-20 MED ORDER — MONTELUKAST SODIUM 10 MG PO TABS: 10.0000 mg | ORAL_TABLET | Freq: Every day | ORAL | 5 refills | Status: DC

## 2017-02-20 MED ORDER — ALBUTEROL SULFATE HFA 108 (90 BASE) MCG/ACT IN AERS: 2.0000 | INHALATION_SPRAY | RESPIRATORY_TRACT | 1 refills | Status: DC | PRN

## 2017-02-20 MED ORDER — BUDESONIDE-FORMOTEROL FUMARATE 160-4.5 MCG/ACT IN AERO: 2.0000 | INHALATION_SPRAY | Freq: Two times a day (BID) | RESPIRATORY_TRACT | 5 refills | Status: DC

## 2017-02-20 NOTE — Progress Notes (Signed)
FOLLOW UP  Date of Service/Encounter:  02/20/17   Assessment:   Moderate persistent asthma, uncomplicated  Chronic nonseasonal allergic rhinitis   Asthma Reportables:  Severity: moderate persistent  Risk: low Control: well controlled   Plan/Recommendations:   1. Moderate persistent asthma, uncomplicated - Lung testing looked good today. - We will change you to Symbicort 160/4.5 since [facility] is no longer covered. - Sample of Symbicort 160/4.5 provided along with copay card.  - Daily controller medication(s): Symbicort 160/4.5 two puffs twice daily with spacer + Singulair 10mg  daily - Rescue medications: ProAir 4 puffs every 4-6 hours as needed - Asthma control goals:  * Full participation in all desired activities (may need albuterol before activity) * Albuterol use two time or less a week on average (not counting use with activity) * Cough interfering with sleep two time or less a month * Oral steroids no more than once a year * No hospitalizations  2. Chronic allergic rhinitis - Continue with Qnasl 62mcg two puffs once daily. - Continue with nasal saline rinses 1-2 times daily.  - Continue with Singulair 10mg  daily. - Add a daily antihistamine: Zyrtec (cetirizine) 10mg  daily or Allegra (fexofenadine) 180mg  daily. - Samples provided.   3. Return in about 3 months (around 05/23/2017).   Subjective:   Hannah Hays is a 51 y.o. female presenting today for follow up of  Chief Complaint  Patient presents with  . Asthma    Well maintained with Dulera. However, insurance does not cover any longe.r     Hannah Hays has a history of the following: Patient Active Problem List   Diagnosis Date Noted  . Hx of adenomatous colonic polyps   . Lipoma 12/26/2015  . Depression 09/23/2015  . Liver mass, left lobe 02/11/2015  . Nasal polyposis 08/20/2014  . Chronic nonallergic rhinitis 08/20/2014  . Vitamin D deficiency 07/12/2012  . HTN (hypertension) 08/19/2011    . GERD (gastroesophageal reflux disease) 04/11/2011  . IBS 12/19/2010  . MIGRAINE HEADACHE 01/19/2010  . IGT (impaired glucose tolerance) 12/26/2009  . Dyslipidemia 10/13/2008  . Asthma 11/15/2007    History obtained from: chart review and patient.  Hannah Hays was referred by Tula Nakayama, MD.     Hannah Hays is a 51 y.o. female presenting for a follow up visit. Hannah Hays was last seen in September 2017. At that time, she was doing well. Continue her on Dulera 200/5 two puffs the morning and two puffs at night. She was also continued on Qnasl 2 sprays per nostril daily. Testing has been positive in the past to mold and dust mite and cockroach.   Since last visit, she has done fairly well. Hannah Hays's asthma has been well controlled. She has not required rescue medication, experienced nocturnal awakenings due to lower respiratory symptoms, nor have activities of daily living been limited. She did have a sinus infection at the beginning of the year, treated with prednisone alone. She did need her rescue inhaler at that time but has not needed it since then. Insurance is no longer covering [place].   She has been taking Mucinex and Singulair. 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 When she is off of the floor, she will use nasal spray and recover. She is downstairs on the floor on a daily basis. She recently had a wasp sting on her right arm with localized swelling this past Sunday (two days ago). She had no systemic reactions  to this.   She works in a [employer]. There are no pets and no smoking at home. She lives at home with her husband and her granddaughter. Her daughter is in the [employer] now, currently in [place] in [place]. Granddaughter is 34 years old, going on 31 according to the patient.  Otherwise, there have been no changes to her past medical history, surgical history, family history, or social history.    Review  of Systems: a 14-point review of systems is pertinent for what is mentioned in HPI.  Otherwise, all other systems were negative. Constitutional: negative other than that listed in the HPI Eyes: negative other than that listed in the HPI Ears, nose, mouth, throat, and face: negative other than that listed in the HPI Respiratory: negative other than that listed in the HPI Cardiovascular: negative other than that listed in the HPI Gastrointestinal: negative other than that listed in the HPI Genitourinary: negative other than that listed in the HPI Integument: negative other than that listed in the HPI Hematologic: negative other than that listed in the HPI Musculoskeletal: negative other than that listed in the HPI Neurological: negative other than that listed in the HPI Allergy/Immunologic: negative other than that listed in the HPI    Objective:   Blood pressure 120/74, pulse (!) 55, temperature 97.5 F (36.4 C), temperature source Oral, weight 142 lb 9.6 oz (64.7 kg), SpO2 98 %. Body mass index is 23.02 kg/m.   Physical Exam:  General: Alert, interactive, in no acute distress. Cooperative with the exam. Pleasant.  Eyes: No conjunctival injection present on the right, No conjunctival injection present on the left, PERRL bilaterally, No discharge on the right, No discharge on the left and No Horner-Trantas dots present Ears: Right TM pearly gray with normal light reflex, Left TM pearly gray with normal light reflex, Right TM intact without perforation and Left TM intact without perforation.  Nose/Throat: External nose within normal limits and septum midline, turbinates edematous and pale with clear discharge, post-pharynx erythematous with cobblestoning in the posterior oropharynx. Tonsils 2+ without exudates Neck: Supple without thyromegaly. Lungs: Clear to auscultation without wheezing, rhonchi or rales. No increased work of breathing. CV: Normal S1/S2, no murmurs. Capillary refill <2  seconds.  Skin: Warm and dry, without lesions or rashes. Neuro:   Grossly intact. No focal deficits appreciated. Responsive to questions.   Diagnostic studies:   Spirometry: results normal (FEV1: 1.91/85%, FVC: 2.71/103%, FEV1/FVC: 70%).    Spirometry consistent with normal pattern.    Allergy Studies: none     [name], MD [facility] [place]

## 2017-02-20 NOTE — Patient Instructions (Addendum)
1. Moderate persistent asthma, uncomplicated - Lung testing looked good today. - We will change you to Symbicort 160/4.5.  - Daily controller medication(s): Symbicort 160/4.5 two puffs twice daily with spacer + Singulair 10mg  daily - Rescue medications: ProAir 4 puffs every 4-6 hours as needed - Asthma control goals:  * Full participation in all desired activities (may need albuterol before activity) * Albuterol use two time or less a week on average (not counting use with activity) * Cough interfering with sleep two time or less a month * Oral steroids no more than once a year * No hospitalizations  2. Chronic allergic rhinitis - Continue with Qnasl 61mcg two puffs once daily. - Continue with nasal saline rinses 1-2 times daily.  - Continue with Singulair 10mg  daily. - Add a daily antihistamine: Zyrtec (cetirizine) 10mg  daily or Allegra (fexofenadine) 180mg  daily. - Samples provided.   3. Return in about 3 months (around 05/23/2017).  Please inform us of any Emergency Department visits, hospitalizations, or changes in symptoms. Call us before going to the ED for breathing or allergy symptoms since we might be able to fit you in for a sick visit. Feel free to contact us anytime with any questions, problems, or concerns.  It was a pleasure to see you again today! Happy spring!   Websites that have reliable patient information: 1. American Academy of Asthma, Allergy, and Immunology: www.aaaai.org 2. Food Allergy Research and Education (FARE): foodallergy.org 3. Mothers of Asthmatics: http://www.asthmacommunitynetwork.org 4. American College of Allergy, Asthma, and Immunology: www.acaai.org

## 2017-03-05 ENCOUNTER — Other Ambulatory Visit: Payer: Self-pay | Admitting: Family Medicine

## 2017-03-21 ENCOUNTER — Other Ambulatory Visit: Payer: Self-pay | Admitting: Family Medicine

## 2017-03-21 DIAGNOSIS — Z1231 Encounter for screening mammogram for malignant neoplasm of breast: Secondary | ICD-10-CM

## 2017-04-04 ENCOUNTER — Ambulatory Visit (HOSPITAL_COMMUNITY): Payer: BLUE CROSS/BLUE SHIELD

## 2017-04-06 ENCOUNTER — Ambulatory Visit (HOSPITAL_COMMUNITY)
Admission: RE | Admit: 2017-04-06 | Discharge: 2017-04-06 | Disposition: A | Discharge status: Home / Self Care | Payer: BLUE CROSS/BLUE SHIELD | Source: Ambulatory Visit | Attending: Family Medicine | Admitting: Family Medicine

## 2017-04-06 DIAGNOSIS — Z1231 Encounter for screening mammogram for malignant neoplasm of breast: Secondary | ICD-10-CM | POA: Diagnosis not present

## 2017-04-10 DIAGNOSIS — I10 Essential (primary) hypertension: Secondary | ICD-10-CM | POA: Diagnosis not present

## 2017-04-10 DIAGNOSIS — E785 Hyperlipidemia, unspecified: Secondary | ICD-10-CM | POA: Diagnosis not present

## 2017-04-10 DIAGNOSIS — R7302 Impaired glucose tolerance (oral): Secondary | ICD-10-CM | POA: Diagnosis not present

## 2017-04-10 LAB — BASIC METABOLIC PANEL
BUN: 9 mg/dL (ref 7–25)
CALCIUM: 9.2 mg/dL (ref 8.6–10.4)
CO2: 29 mmol/L (ref 20–31)
CREATININE: 0.76 mg/dL (ref 0.50–1.05)
Chloride: 105 mmol/L (ref 98–110)
GLUCOSE: 98 mg/dL (ref 65–99)
Potassium: 3.8 mmol/L (ref 3.5–5.3)
Sodium: 143 mmol/L (ref 135–146)

## 2017-04-10 LAB — LIPID PANEL
CHOL/HDL RATIO: 4.5 ratio (ref <5.0)
Cholesterol: 221 mg/dL — ABNORMAL HIGH (ref <200)
HDL: 49 mg/dL — AB (ref >50)
LDL Cholesterol: 152 mg/dL — ABNORMAL HIGH (ref <100)
TRIGLYCERIDES: 99 mg/dL (ref <150)
VLDL: 20 mg/dL (ref <30)

## 2017-04-11 ENCOUNTER — Ambulatory Visit (INDEPENDENT_AMBULATORY_CARE_PROVIDER_SITE_OTHER): Payer: BLUE CROSS/BLUE SHIELD | Admitting: Family Medicine

## 2017-04-11 ENCOUNTER — Encounter: Payer: Self-pay | Admitting: Family Medicine

## 2017-04-11 VITALS — BP 130/70 | HR 61 | Resp 16 | Ht 66.0 in | Wt 148.0 lb

## 2017-04-11 DIAGNOSIS — R7302 Impaired glucose tolerance (oral): Secondary | ICD-10-CM | POA: Diagnosis not present

## 2017-04-11 DIAGNOSIS — E785 Hyperlipidemia, unspecified: Secondary | ICD-10-CM

## 2017-04-11 DIAGNOSIS — I10 Essential (primary) hypertension: Secondary | ICD-10-CM | POA: Diagnosis not present

## 2017-04-11 DIAGNOSIS — E559 Vitamin D deficiency, unspecified: Secondary | ICD-10-CM | POA: Diagnosis not present

## 2017-04-11 DIAGNOSIS — J310 Chronic rhinitis: Secondary | ICD-10-CM | POA: Diagnosis not present

## 2017-04-11 DIAGNOSIS — J4540 Moderate persistent asthma, uncomplicated: Secondary | ICD-10-CM | POA: Diagnosis not present

## 2017-04-11 LAB — HEMOGLOBIN A1C
Hgb A1c MFr Bld: 5.7 % — ABNORMAL HIGH (ref <5.7)
Mean Plasma Glucose: 117 mg/dL

## 2017-04-11 NOTE — Assessment & Plan Note (Signed)
Controlled, no change in medication  

## 2017-04-11 NOTE — Assessment & Plan Note (Signed)
Controlled and on daily OTC vitamin D

## 2017-04-11 NOTE — Assessment & Plan Note (Signed)
Patient educated about the importance of limiting  Carbohydrate intake , the need to commit to daily physical activity for a minimum of 30 minutes , and to commit weight loss. The fact that changes in all these areas will reduce or eliminate all together the development of diabetes is stressed.   Diabetic Labs Latest Ref Rng & Units 04/10/2017 02/22/2016 09/16/2015 08/12/2015 02/11/2015  HbA1c <5.7 % 5.7(H) - - - 5.7(H)  Chol <200 mg/dL 221(H) 191 230 - 223(H)  HDL >50 mg/dL 49(L) 50 - - 55  Calc LDL <100 mg/dL 152(H) 128 - - 152(H)  Triglycerides <150 mg/dL 99 67 - - 79  Creatinine 0.50 - 1.05 mg/dL 0.76 0.79 - 0.71 0.75   BP/Weight 04/11/2017 02/20/2017 10/11/2016 08/22/2016 05/24/2016 3/83/7793 08/10/8863  Systolic BP 847 207 218 288 337 445 146  Diastolic BP 70 74 90 80 84 80 49  Wt. (Lbs) 148 142.6 150 - 147 149 147  BMI 23.89 23.02 24.21 - 23.74 24.06 24.08   No flowsheet data found.

## 2017-04-11 NOTE — Assessment & Plan Note (Signed)
Deteriorated Hyperlipidemia:Low fat diet discussed and encouraged.   Lipid Panel  Lab Results  Component Value Date   CHOL 221 (H) 04/10/2017   HDL 49 (L) 04/10/2017   LDLCALC 152 (H) 04/10/2017   TRIG 99 04/10/2017   CHOLHDL 4.5 04/10/2017   Updated lab needed at/ before next visit.

## 2017-04-11 NOTE — Assessment & Plan Note (Addendum)
Controlled, no change in medication DASH diet and commitment to daily physical activity for a minimum of 30 minutes discussed and encouraged, as a part of hypertension management. The importance of attaining a healthy weight is also discussed.  DASH diet and commitment to daily physical activity for a minimum of 30 minutes discussed and encouraged, as a part of hypertension management. The importance of attaining a healthy weight is also discussed.  BP/Weight 04/11/2017 02/20/2017 10/11/2016 08/22/2016 05/24/2016 1/61/0960 03/08/4097  Systolic BP 119 147 829 562 130 865 784  Diastolic BP 70 74 90 80 84 80 49  Wt. (Lbs) 148 142.6 150 - 147 149 147  BMI 23.89 23.02 24.21 - 23.74 24.06 24.08

## 2017-04-11 NOTE — Progress Notes (Signed)
[name]     MRN: [id number]      DOB: 1966/03/13   HPI [name] is here for follow up and re-evaluation of chronic medical conditions, medication management and review of any available recent lab and radiology data.  Preventive health is updated, specifically  Cancer screening and Immunization.   Questions or concerns regarding consultations or procedures which the PT has had in the interim are  addressed. The PT denies any adverse reactions to current medications since the last visit.  There are no new concerns.  There are no specific complaints   ROS Denies recent fever or chills. Denies sinus pressure, nasal congestion, ear pain or sore throat. Denies chest congestion, productive cough or wheezing. Denies chest pains, palpitations and leg swelling Denies abdominal pain, nausea, vomiting,diarrhea or constipation.   Denies dysuria, frequency, hesitancy or incontinence. Denies joint pain, swelling and limitation in mobility. Denies headaches, seizures, numbness, or tingling. Denies depression, anxiety or insomnia. Denies skin break down or rash.   PE  BP 130/70   Pulse 61   Resp 16   Ht 5\' 6"  (1.676 m)   Wt 148 lb (67.1 kg)   SpO2 98%   BMI 23.89 kg/m    Patient alert and oriented and in no cardiopulmonary distress.  HEENT: No facial asymmetry, EOMI,   oropharynx pink and moist.  Neck supple no JVD, no mass.  Chest: Clear to auscultation bilaterally.  CVS: S1, S2 no murmurs, no S3.Regular rate.  ABD: Soft non tender.   Ext: No edema  MS: Adequate ROM spine, shoulders, hips and knees.  Skin: Intact, no ulcerations or rash noted.  Psych: Good eye contact, normal affect. Memory intact not anxious or depressed appearing.  CNS: CN 2-12 intact, power,  normal throughout.no focal deficits noted.   Assessment & Plan  HTN (hypertension) Controlled, no change in medication DASH diet and commitment to daily physical activity for a minimum of 30 minutes  discussed and encouraged, as a part of hypertension management. The importance of attaining a healthy weight is also discussed.  DASH diet and commitment to daily physical activity for a minimum of 30 minutes discussed and encouraged, as a part of hypertension management. The importance of attaining a healthy weight is also discussed.  BP/Weight 04/11/2017 02/20/2017 10/11/2016 08/22/2016 05/24/2016 0/62/3762 07/06/1516  Systolic BP 616 073 710 626 948 546 270  Diastolic BP 70 74 90 80 84 80 49  Wt. (Lbs) 148 142.6 150 - 147 149 147  BMI 23.89 23.02 24.21 - 23.74 24.06 24.08          Dyslipidemia Deteriorated Hyperlipidemia:Low fat diet discussed and encouraged.   Lipid Panel  Lab Results  Component Value Date   CHOL 221 (H) 04/10/2017   HDL 49 (L) 04/10/2017   LDLCALC 152 (H) 04/10/2017   TRIG 99 04/10/2017   CHOLHDL 4.5 04/10/2017   Updated lab needed at/ before next visit.     IGT (impaired glucose tolerance) Patient educated about the importance of limiting  Carbohydrate intake , the need to commit to daily physical activity for a minimum of 30 minutes , and to commit weight loss. The fact that changes in all these areas will reduce or eliminate all together the development of diabetes is stressed.   Diabetic Labs Latest Ref Rng & Units 04/10/2017 02/22/2016 09/16/2015 08/12/2015 02/11/2015  HbA1c <5.7 % 5.7(H) - - - 5.7(H)  Chol <200 mg/dL 221(H) 191 230 - 223(H)  HDL >50 mg/dL 49(L) 50 - -  55  Calc LDL <100 mg/dL 152(H) 128 - - 152(H)  Triglycerides <150 mg/dL 99 67 - - 79  Creatinine 0.50 - 1.05 mg/dL 0.76 0.79 - 0.71 0.75   BP/Weight 04/11/2017 02/20/2017 10/11/2016 08/22/2016 05/24/2016 7/67/2094 7/0/9628  Systolic BP 366 294 765 465 035 465 681  Diastolic BP 70 74 90 80 84 80 49  Wt. (Lbs) 148 142.6 150 - 147 149 147  BMI 23.89 23.02 24.21 - 23.74 24.06 24.08   No flowsheet data found.    Chronic nonallergic rhinitis Controlled, no change in  medication   Asthma Controlled, no change in medication   Vitamin D deficiency Controlled and on daily OTC vitamin D

## 2017-04-11 NOTE — Patient Instructions (Signed)
f/u in 6 month, call if you need me sooner  Please work on good  health habits so that your health will improve. 1. Commitment to daily physical activity for 30 to 60  minutes, if you are able to do this.  2. Commitment to wise food choices. Aim for half of your  food intake to be vegetable and fruit, one quarter starchy foods, and one quarter protein. Try to eat on a regular schedule  3 meals per day, snacking between meals should be limited to vegetables or fruits or small portions of nuts. 64 ounces of water per day is generally recommended, unless you have specific health conditions, like heart failure or kidney failure where you will need to limit fluid intake.  3. Commitment to sufficient and a  good quality of physical and mental rest daily, generally between 6 to 8 hours per day.  WITH PERSISTANCE AND PERSEVERANCE, THE IMPOSSIBLE , BECOMES THE NORM! It is important that you exercise regularly at least 30 minutes 5 times a week. If you develop chest pain, have severe difficulty breathing, or feel very tired, stop exercising immediately and seek medical attention    We will mail your fasting lab sheet to you, CBC, lipid, cmp and eGFr, hBa1C, TSH and vit D, pls get this in 3rd week in October

## 2017-05-22 ENCOUNTER — Other Ambulatory Visit: Payer: Self-pay | Admitting: Family Medicine

## 2017-05-22 DIAGNOSIS — I10 Essential (primary) hypertension: Secondary | ICD-10-CM

## 2017-05-29 ENCOUNTER — Ambulatory Visit (INDEPENDENT_AMBULATORY_CARE_PROVIDER_SITE_OTHER): Payer: BLUE CROSS/BLUE SHIELD | Admitting: Allergy & Immunology

## 2017-05-29 ENCOUNTER — Encounter: Payer: Self-pay | Admitting: Allergy & Immunology

## 2017-05-29 VITALS — BP 130/90 | HR 66 | Temp 98.0°F | Resp 16

## 2017-05-29 DIAGNOSIS — J3089 Other allergic rhinitis: Secondary | ICD-10-CM | POA: Diagnosis not present

## 2017-05-29 DIAGNOSIS — J4540 Moderate persistent asthma, uncomplicated: Secondary | ICD-10-CM

## 2017-05-29 MED ORDER — MOMETASONE FURO-FORMOTEROL FUM 200-5 MCG/ACT IN AERO: 2.0000 | INHALATION_SPRAY | Freq: Two times a day (BID) | RESPIRATORY_TRACT | 5 refills | Status: DC

## 2017-05-29 NOTE — Progress Notes (Signed)
FOLLOW UP  Date of Service/Encounter:  05/29/17   Assessment:   Moderate persistent asthma, uncomplicated  Perennial allergic rhinitis   Asthma Reportables:  Severity: moderate persistent  Risk: high Control: not well controlled   Plan/Recommendations:   1. Moderate persistent asthma, uncomplicated - Lung testing looked good today. - We will fight with the insurance company to get [facility] approved. - You would also qualify for an anti-eosinophil medication as well (information provided on [place]).  - Sample of Dulera provided.  - Daily controller medication(s): Dulera 200/5 two puffs twice daily with spacer + Singulair 10mg  daily - Rescue medications: ProAir 4 puffs every 4-6 hours as needed - Asthma control goals:  * Full participation in all desired activities (may need albuterol before activity) * Albuterol use two time or less a week on average (not counting use with activity) * Cough interfering with sleep two time or less a month * Oral steroids no more than once a year * No hospitalizations  2. Chronic allergic rhinitis  - Continue with Qnasl 16mcg two puffs once daily. - Continue with nasal saline rinses 1-2 times daily.  - Continue with Singulair 10mg  daily. - Add a daily antihistamine: Zyrtec (cetirizine) 10mg  daily or Allegra (fexofenadine) 180mg  daily. - Samples provided.   3. Return in about 3 months (around 08/29/2017).  Subjective:   Hannah Hays is a 51 y.o. female presenting today for follow up of  Chief Complaint  Patient presents with  . Asthma    ADAMARY Hays has a history of the following: Patient Active Problem List   Diagnosis Date Noted  . Hx of adenomatous colonic polyps   . Lipoma 12/26/2015  . Liver mass, left lobe 02/11/2015  . Nasal polyposis 08/20/2014  . Chronic nonallergic rhinitis 08/20/2014  . Vitamin D deficiency 07/12/2012  . HTN (hypertension) 08/19/2011  . GERD (gastroesophageal reflux disease)  04/11/2011  . IBS 12/19/2010  . IGT (impaired glucose tolerance) 12/26/2009  . Dyslipidemia 10/13/2008  . Asthma 11/15/2007    History obtained from: chart review and patient.  Hannah Hays was referred by Hannah Helper, Hays.     Hannah Hays is a 51 y.o. female presenting for a follow up visit. She was last seen in March 2018. At that time, we changed her to Symbicort 160/4.5 g 2 puffs twice daily since [facility]. We also continued her on Singulair 10 mg daily. for her allergic rhinitis, we continued Qnasl 80 g 2 puffs daily, nasal saline rinses, and Zyrtec 10 mg daily.  Since the last visit, she has mostly done well. Hannah Hays's asthma has been well controlled. She has not required rescue medication, experienced nocturnal awakenings due to lower respiratory symptoms, nor have activities of daily living been limited. She has required no Emergency Department or Urgent Care visits for her asthma. She has required zero courses of systemic steroids for asthma exacerbations since the last visit. ACT score today is 19, indicating excellent asthma symptom control.   However, Hannah Hays does feel that the Hannah Hays was working better than the Symbicort. She is coughing 3-4 times per week at night. She has tried using ProAir but this has provided minimal relief. She gets off at 11:30pm at night and when she gets home she needs to "clear it all out". Then she goes to sleep and will sleep for several hours without interruption. She does wear two masks at work to try to help with this. She does have  Nexium but she only uses it as needed.   Allergic rhinitis has been well controlled with antihistamines as needed. She is not using anything on a routine basis.  Otherwise, there have been no changes to her past medical history, surgical history, family history, or social history.    Review of Systems: a 14-point review of systems is pertinent for what is mentioned in HPI.  Otherwise, all other  systems were negative. Constitutional: negative other than that listed in the HPI Eyes: negative other than that listed in the HPI Ears, nose, mouth, throat, and face: negative other than that listed in the HPI Respiratory: negative other than that listed in the HPI Cardiovascular: negative other than that listed in the HPI Gastrointestinal: negative other than that listed in the HPI Genitourinary: negative other than that listed in the HPI Integument: negative other than that listed in the HPI Hematologic: negative other than that listed in the HPI Musculoskeletal: negative other than that listed in the HPI Neurological: negative other than that listed in the HPI Allergy/Immunologic: negative other than that listed in the HPI    Objective:   Blood pressure 130/90, pulse 66, temperature 98 F (36.7 C), resp. rate 16, SpO2 96 %. There is no height or weight on file to calculate BMI.   Physical Exam:  General: Alert, interactive, in no acute distress. Pleasant smiling female. Eyes: No conjunctival injection present on the right, No conjunctival injection present on the left, PERRL bilaterally, No discharge on the right and No discharge on the left Ears: Right TM pearly gray with normal light reflex, Left TM pearly gray with normal light reflex, Right TM intact without perforation and Left TM intact without perforation.  Nose/Throat: External nose within normal limits and septum midline, turbinates edematous and pale with clear discharge, post-pharynx erythematous without cobblestoning in the posterior oropharynx. Tonsils 2+ without exudates Neck: Supple without thyromegaly. Lungs: Clear to auscultation without wheezing, rhonchi or rales. No increased work of breathing. CV: Normal S1/S2, no murmurs. Capillary refill <2 seconds.  Skin: Warm and dry, without lesions or rashes. Neuro:   Grossly intact. No focal deficits appreciated. Responsive to questions.   Diagnostic studies:    Spirometry: results normal (FEV1: 1.50/67%, FVC: 2.13/81%, FEV1/FVC: 70%).    Spirometry consistent with normal pattern.   Allergy Studies: none    Hannah Marvel, Hays [place] [facility] [place]

## 2017-05-29 NOTE — Addendum Note (Signed)
Addended by: York Grice on: 05/29/2017 02:34 PM   Modules accepted: Orders

## 2017-05-29 NOTE — Patient Instructions (Addendum)
1. Moderate persistent asthma, uncomplicated - Lung testing looked good today. - We will fight with the insurance company to get [facility] approved. - You would also qualify for an anti-eosinophil medication as well (information provided on [place]).  - Sample of Dulera provided.  - Daily controller medication(s): Dulera 200/5 two puffs twice daily with spacer + Singulair 10mg  daily - Rescue medications: ProAir 4 puffs every 4-6 hours as needed - Asthma control goals:  * Full participation in all desired activities (may need albuterol before activity) * Albuterol use two time or less a week on average (not counting use with activity) * Cough interfering with sleep two time or less a month * Oral steroids no more than once a year * No hospitalizations  2. Chronic allergic rhinitis  - Continue with Qnasl 72mcg two puffs once daily. - Continue with nasal saline rinses 1-2 times daily.  - Continue with Singulair 10mg  daily. - Add a daily antihistamine: Zyrtec (cetirizine) 10mg  daily or Allegra (fexofenadine) 180mg  daily. - Samples provided.   3. Return in about 3 months (around 08/29/2017).  Please inform us of any Emergency Department visits, hospitalizations, or changes in symptoms. Call us before going to the ED for breathing or allergy symptoms since we might be able to fit you in for a sick visit. Feel free to contact us anytime with any questions, problems, or concerns.  It was a pleasure to see you again today! Have a wonderful summer!   Websites that have reliable patient information: 1. American Academy of Asthma, Allergy, and Immunology: www.aaaai.org 2. Food Allergy Research and Education (FARE): foodallergy.org 3. Mothers of Asthmatics: http://www.asthmacommunitynetwork.org 4. American College of Allergy, Asthma, and Immunology: www.acaai.org

## 2017-05-30 ENCOUNTER — Telehealth: Payer: Self-pay

## 2017-05-30 MED ORDER — FLUTICASONE-SALMETEROL 115-21 MCG/ACT IN AERO
2.0000 | INHALATION_SPRAY | Freq: Two times a day (BID) | RESPIRATORY_TRACT | 5 refills | Status: DC
Start: 2017-05-30 — End: 2018-11-19

## 2017-05-30 NOTE — Telephone Encounter (Signed)
Called patient. Left message to call back in regards to the changing of the inhalers.

## 2017-05-30 NOTE — Telephone Encounter (Signed)
Received fax from pharmacy in regards to insurance not covering [place]. Insurance perfers Advair, Breo, or Symbicort. I looked through medications she has tried Symbicort, and has not tried Advair or Breo.  Please advise

## 2017-05-30 NOTE — Telephone Encounter (Signed)
Let's send in Advair 115/21 two puffs twice daily. Please call Ms. Gearheart to let her know. I believe she has Dulera samples that we gave her, therefore she can change to Advair once those are gone.   [name], MD [place] [facility] [place]

## 2017-05-30 NOTE — Addendum Note (Signed)
Addended by: Martyn Malay on: 05/30/2017 02:34 PM   Modules accepted: Orders

## 2017-05-31 NOTE — Telephone Encounter (Signed)
Patient called back and I informed her that we changed her Dulera to Advair 115/21 2 puffs twice a day. The medication was sent to the pharmacy.

## 2017-08-28 ENCOUNTER — Encounter: Payer: Self-pay | Admitting: Allergy & Immunology

## 2017-08-28 ENCOUNTER — Ambulatory Visit (INDEPENDENT_AMBULATORY_CARE_PROVIDER_SITE_OTHER): Payer: BLUE CROSS/BLUE SHIELD | Admitting: Allergy & Immunology

## 2017-08-28 VITALS — BP 140/90 | HR 65 | Temp 97.8°F | Resp 16

## 2017-08-28 DIAGNOSIS — J3089 Other allergic rhinitis: Secondary | ICD-10-CM | POA: Diagnosis not present

## 2017-08-28 DIAGNOSIS — R059 Cough, unspecified: Secondary | ICD-10-CM

## 2017-08-28 DIAGNOSIS — R05 Cough: Secondary | ICD-10-CM

## 2017-08-28 DIAGNOSIS — J4540 Moderate persistent asthma, uncomplicated: Secondary | ICD-10-CM | POA: Diagnosis not present

## 2017-08-28 NOTE — Progress Notes (Signed)
FOLLOW UP  Date of Service/Encounter:  08/28/17   Assessment:   Moderate persistent asthma, uncomplicated  Perennial allergic rhinitis   Asthma Reportables:  Severity: moderate persistent  Risk: high Control: not well controlled  Plan/Recommendations:   1. Moderate persistent asthma, uncomplicated - Lung testing looked good today. - We will fight with the insurance company to get [facility] approved. - You would also qualify for an anti-eosinophil medication as well (information provided on [place]), and we will talk to Hannah Hays about how much it would be.   - Daily controller medication(s): Advair 115/21 two puffs twice daily with spacer + Singulair 10mg  daily - Rescue medications: ProAir 4 puffs every 4-6 hours as needed - Asthma control goals:  * Full participation in all desired activities (may need albuterol before activity) * Albuterol use two time or less a week on average (not counting use with activity) * Cough interfering with sleep two time or less a month * Oral steroids no more than once a year * No hospitalizations  2. Chronic allergic rhinitis  - Continue with Qnasl 75mcg two puffs once daily. - Continue with nasal saline rinses 1-2 times daily.  - Continue with Singulair 10mg  daily. - Add a daily antihistamine: Zyrtec (cetirizine) 10mg  daily or Allegra (fexofenadine) 180mg  daily. - Samples provided.   3. Return in about 6 months (around 02/25/2018).   Subjective:   Hannah Hays is a 51 y.o. female presenting today for follow up of  Chief Complaint  Patient presents with  . Asthma    Hannah Hays has a history of the following: Patient Active Problem List   Diagnosis Date Noted  . Hx of adenomatous colonic polyps   . Lipoma 12/26/2015  . Liver mass, left lobe 02/11/2015  . Nasal polyposis 08/20/2014  . Chronic nonallergic rhinitis 08/20/2014  . Vitamin D deficiency 07/12/2012  . HTN (hypertension) 08/19/2011  . GERD  (gastroesophageal reflux disease) 04/11/2011  . IBS 12/19/2010  . IGT (impaired glucose tolerance) 12/26/2009  . Dyslipidemia 10/13/2008  . Asthma 11/15/2007    History obtained from: chart review and patient.  Hannah Hays's Primary Care Provider is Hannah Helper, MD.     Hannah Hays is a 51 y.o. female presenting for a follow up visit. She was last seen in June 2018. At that time, we continued her on Dulera since this provided better symptom control for her. She does have a history of elevated eosinophils, and we briefly discussed starting an anti-IL5 medication. For her allergic rhinitis, we continued her on Qnasl two puffs once daily as well as Singulair 10mg  daily and an antihistamine daily. She had previously been on Symbicort due to insurance coverage, but felt that [facility] provided better control.   Since the last visit, she has mostly done well. After the last visit, we tried to get [facility] approved but this was not successful. We had to try Advair and Breo first. We ended up sending in Advair 115/21 two puffs BID. She remains on the Advair, but again feels that this is not doing as well as the [facility]. She feels that she is having some more wheezing than when she was on the [facility]. She has never been on Breo and it is not clear that she needs to fail to get the [facility] approved.   She does endorse nasal congestion during this season. She is on Mucinex now, which resulted in nausea. She stopped taking it since that time. She does remain on  the Qnasl two sprays per nostril daily, which she remains on intermittently throughout the year.    She is currently working at [employer] and she is going to be laid off for a short period of time (two months). She is stopping October 12th and will be returning on December 15th, although this date is not set into stone. She is not getting paid at all during that time. Evidently they have told their employees to go job hunting and get on unemployment during  that time. She is planning to continue to pay for her insurance while she is laid off, but she is not happy about it.   Otherwise, there have been no changes to her past medical history, surgical history, family history, or social history.     Review of Systems: a 14-point review of systems is pertinent for what is mentioned in HPI.  Otherwise, all other systems were negative. Constitutional: negative other than that listed in the HPI Eyes: negative other than that listed in the HPI Ears, nose, mouth, throat, and face: negative other than that listed in the HPI Respiratory: negative other than that listed in the HPI Cardiovascular: negative other than that listed in the HPI Gastrointestinal: negative other than that listed in the HPI Genitourinary: negative other than that listed in the HPI Integument: negative other than that listed in the HPI Hematologic: negative other than that listed in the HPI Musculoskeletal: negative other than that listed in the HPI Neurological: negative other than that listed in the HPI Allergy/Immunologic: negative other than that listed in the HPI    Objective:   Blood pressure 140/90, pulse 65, temperature 97.8 F (36.6 C), temperature source Oral, resp. rate 16, SpO2 98 %. There is no height or weight on file to calculate BMI.   Physical Exam:  General: Alert, interactive, in no acute distress. Eyes: No conjunctival injection present on the right, No conjunctival injection present on the left, PERRL bilaterally, No discharge on the right, No discharge on the left and No Horner-Trantas dots present Ears: Right TM pearly gray with normal light reflex, Left TM pearly gray with normal light reflex, Right TM intact without perforation and Left TM intact without perforation.  Nose/Throat: External nose within normal limits and septum midline, turbinates edematous and pale with clear discharge, post-pharynx erythematous with cobblestoning in the posterior  oropharynx. Tonsils 2+ without exudates Neck: Supple without thyromegaly. Lungs: Clear to auscultation without wheezing, rhonchi or rales. No increased work of breathing. CV: Normal S1/S2, no murmurs. Capillary refill <2 seconds.  Skin: Warm and dry, without lesions or rashes. Neuro:   Grossly intact. No focal deficits appreciated. Responsive to questions.   Diagnostic studies:   Spirometry: results normal (FEV1: 1.39/62%, FVC: 2.07/79%, FEV1/FVC: 66%).    Spirometry consistent with mild obstructive disease.   Allergy Studies: none     [name], MD [place] [place]

## 2017-08-28 NOTE — Patient Instructions (Addendum)
1. Moderate persistent asthma, uncomplicated - Lung testing looked good today. - We will fight with the insurance company to get [facility] approved. - You would also qualify for an anti-eosinophil medication as well (information provided on [place]), and we will talk to Tammy about how much it would be.   - Daily controller medication(s): Advair 115/21 two puffs twice daily with spacer + Singulair 10mg  daily - Rescue medications: ProAir 4 puffs every 4-6 hours as needed - Asthma control goals:  * Full participation in all desired activities (may need albuterol before activity) * Albuterol use two time or less a week on average (not counting use with activity) * Cough interfering with sleep two time or less a month * Oral steroids no more than once a year * No hospitalizations  2. Chronic allergic rhinitis  - Continue with Qnasl 51mcg two puffs once daily. - Continue with nasal saline rinses 1-2 times daily.  - Continue with Singulair 10mg  daily. - Add a daily antihistamine: Zyrtec (cetirizine) 10mg  daily or Allegra (fexofenadine) 180mg  daily. - Samples provided.   3. Return in about 6 months (around 02/25/2018).  Please inform us of any Emergency Department visits, hospitalizations, or changes in symptoms. Call us before going to the ED for breathing or allergy symptoms since we might be able to fit you in for a sick visit. Feel free to contact us anytime with any questions, problems, or concerns.  It was a pleasure to see you again today! Enjoy the upcoming fall season! Email me with problems when you are out of work so you don't have to come in for a visit ([email or web address]).  Websites that have reliable patient information: 1. American Academy of Asthma, Allergy, and Immunology: www.aaaai.org 2. Food Allergy Research and Education (FARE): foodallergy.org 3. Mothers of Asthmatics: http://www.asthmacommunitynetwork.org 4. American College of Allergy, Asthma, and  Immunology: www.acaai.org   Election Day is coming up on Tuesday, November 6th! Make your voice heard! Register to vote at vote.org!

## 2017-09-05 ENCOUNTER — Telehealth: Payer: Self-pay | Admitting: *Deleted

## 2017-09-05 NOTE — Telephone Encounter (Signed)
L/M for patient to contact me regarding biologics and starting Fasenra for management of her asthma.  L/M for patient again 10/3 to discuss therapy also need to get information regarding her pharmacy benefit plan. Name? ([facility]?) ID BIN PCN if she has same.

## 2017-09-25 ENCOUNTER — Ambulatory Visit (INDEPENDENT_AMBULATORY_CARE_PROVIDER_SITE_OTHER): Payer: BLUE CROSS/BLUE SHIELD

## 2017-09-25 DIAGNOSIS — J4550 Severe persistent asthma, uncomplicated: Secondary | ICD-10-CM | POA: Diagnosis not present

## 2017-09-25 MED ORDER — EPINEPHRINE 0.3 MG/0.3ML IJ SOAJ: 0.3000 mg | Freq: Once | INTRAMUSCULAR | 1 refills | Status: AC

## 2017-09-25 MED ORDER — BENRALIZUMAB 30 MG/ML ~~LOC~~ SOSY
30.0000 mg | PREFILLED_SYRINGE | SUBCUTANEOUS | Status: DC
  Administered 2017-09-25 – 2023-03-28 (×33): 30 mg via SUBCUTANEOUS

## 2017-09-25 NOTE — Progress Notes (Signed)
Immunotherapy   Patient Details  Name: Hannah Hays MRN: [id number] Date of Birth: [birth year]  09/25/2017  Hannah Hays started injections for  Hannah Hays  Frequency:Every 4 weeks for 3 months and then every 8 weeks. Epi-Pen:Epi-Pen Available  Consent signed and patient instructions given.   Hannah Hays 09/25/2017, 4:52 PM

## 2017-10-13 DIAGNOSIS — E559 Vitamin D deficiency, unspecified: Secondary | ICD-10-CM | POA: Diagnosis not present

## 2017-10-13 DIAGNOSIS — I10 Essential (primary) hypertension: Secondary | ICD-10-CM | POA: Diagnosis not present

## 2017-10-13 DIAGNOSIS — E785 Hyperlipidemia, unspecified: Secondary | ICD-10-CM | POA: Diagnosis not present

## 2017-10-15 LAB — COMPLETE METABOLIC PANEL WITH GFR
AG RATIO: 1.4 (calc) (ref 1.0–2.5)
ALKALINE PHOSPHATASE (APISO): 97 U/L (ref 33–130)
ALT: 23 U/L (ref 6–29)
AST: 19 U/L (ref 10–35)
Albumin: 4.2 g/dL (ref 3.6–5.1)
BILIRUBIN TOTAL: 0.3 mg/dL (ref 0.2–1.2)
BUN: 10 mg/dL (ref 7–25)
CHLORIDE: 105 mmol/L (ref 98–110)
CO2: 30 mmol/L (ref 20–32)
Calcium: 9.3 mg/dL (ref 8.6–10.4)
Creat: 0.81 mg/dL (ref 0.50–1.05)
GFR, EST AFRICAN AMERICAN: 97 mL/min/{1.73_m2} (ref >=60)
GFR, Est Non African American: 84 mL/min/{1.73_m2} (ref >=60)
GLUCOSE: 119 mg/dL — AB (ref 65–99)
Globulin: 3 g/dL (calc) (ref 1.9–3.7)
POTASSIUM: 4.1 mmol/L (ref 3.5–5.3)
Sodium: 142 mmol/L (ref 135–146)
TOTAL PROTEIN: 7.2 g/dL (ref 6.1–8.1)

## 2017-10-15 LAB — CBC
HEMATOCRIT: 36.7 % (ref 35.0–45.0)
HEMOGLOBIN: 12.4 g/dL (ref 11.7–15.5)
MCH: 28.1 pg (ref 27.0–33.0)
MCHC: 33.8 g/dL (ref 32.0–36.0)
MCV: 83 fL (ref 80.0–100.0)
MPV: 12.3 fL (ref 7.5–12.5)
Platelets: 195 10*3/uL (ref 140–400)
RBC: 4.42 10*6/uL (ref 3.80–5.10)
RDW: 14 % (ref 11.0–15.0)
WBC: 5 10*3/uL (ref 3.8–10.8)

## 2017-10-15 LAB — HEMOGLOBIN A1C
EAG (MMOL/L): 6.8 (calc)
Hgb A1c MFr Bld: 5.9 % of total Hgb — ABNORMAL HIGH (ref <5.7)
Mean Plasma Glucose: 123 (calc)

## 2017-10-15 LAB — LIPID PANEL
Cholesterol: 263 mg/dL — ABNORMAL HIGH (ref <200)
HDL: 56 mg/dL (ref >50)
LDL CHOLESTEROL (CALC): 185 mg/dL — AB
NON-HDL CHOLESTEROL (CALC): 207 mg/dL — AB (ref <130)
TRIGLYCERIDES: 98 mg/dL (ref <150)
Total CHOL/HDL Ratio: 4.7 (calc) (ref <5.0)

## 2017-10-15 LAB — VITAMIN D 25 HYDROXY (VIT D DEFICIENCY, FRACTURES): Vit D, 25-Hydroxy: 23 ng/mL — ABNORMAL LOW (ref 30–100)

## 2017-10-15 LAB — TSH: TSH: 1.54 m[IU]/L

## 2017-10-16 ENCOUNTER — Ambulatory Visit (INDEPENDENT_AMBULATORY_CARE_PROVIDER_SITE_OTHER): Payer: BLUE CROSS/BLUE SHIELD | Admitting: Family Medicine

## 2017-10-16 ENCOUNTER — Encounter: Payer: Self-pay | Admitting: Family Medicine

## 2017-10-16 VITALS — BP 124/80 | HR 91 | Resp 16 | Ht 66.0 in | Wt 147.0 lb

## 2017-10-16 DIAGNOSIS — R7302 Impaired glucose tolerance (oral): Secondary | ICD-10-CM

## 2017-10-16 DIAGNOSIS — J4540 Moderate persistent asthma, uncomplicated: Secondary | ICD-10-CM

## 2017-10-16 DIAGNOSIS — J3089 Other allergic rhinitis: Secondary | ICD-10-CM | POA: Diagnosis not present

## 2017-10-16 DIAGNOSIS — I10 Essential (primary) hypertension: Secondary | ICD-10-CM

## 2017-10-16 DIAGNOSIS — E559 Vitamin D deficiency, unspecified: Secondary | ICD-10-CM

## 2017-10-16 DIAGNOSIS — E785 Hyperlipidemia, unspecified: Secondary | ICD-10-CM | POA: Insufficient documentation

## 2017-10-16 MED ORDER — ROSUVASTATIN CALCIUM 20 MG PO TABS: 20.0000 mg | ORAL_TABLET | Freq: Every day | ORAL | 1 refills | Status: DC

## 2017-10-16 NOTE — Assessment & Plan Note (Signed)
Deteriorated Patient educated about the importance of limiting  Carbohydrate intake , the need to commit to daily physical activity for a minimum of 30 minutes , and to commit weight loss. The fact that changes in all these areas will reduce or eliminate all together the development of diabetes is stressed.   Diabetic Labs Latest Ref Rng & Units 10/13/2017 04/10/2017 02/22/2016 09/16/2015 08/12/2015  HbA1c <5.7 % of total Hgb 5.9(H) 5.7(H) - - -  Chol <200 mg/dL 263(H) 221(H) 191 230 -  HDL >50 mg/dL 56 49(L) 50 - -  Calc LDL <100 mg/dL - 152(H) 128 - -  Triglycerides <150 mg/dL 98 99 67 - -  Creatinine 0.50 - 1.05 mg/dL 0.81 0.76 0.79 - 0.71   BP/Weight 10/16/2017 08/28/2017 05/29/2017 04/11/2017 02/20/2017 10/11/2016 08/27/2682  Systolic BP 419 622 297 989 211 941 740  Diastolic BP 80 90 90 70 74 90 80  Wt. (Lbs) 147 - - 148 142.6 150 -  BMI 23.73 - - 23.89 23.02 24.21 -   No flowsheet data found.

## 2017-10-16 NOTE — Assessment & Plan Note (Signed)
Controlled, no change in medication DASH diet and commitment to daily physical activity for a minimum of 30 minutes discussed and encouraged, as a part of hypertension management. The importance of attaining a healthy weight is also discussed.  BP/Weight 10/16/2017 08/28/2017 05/29/2017 04/11/2017 02/20/2017 10/11/2016 3/83/8184  Systolic BP 037 543 606 770 340 352 481  Diastolic BP 80 90 90 70 74 90 80  Wt. (Lbs) 147 - - 148 142.6 150 -  BMI 23.73 - - 23.89 23.02 24.21 -

## 2017-10-16 NOTE — Assessment & Plan Note (Signed)
Deteriorated Hyperlipidemia:Low fat diet discussed and encouraged.   Lipid Panel  Lab Results  Component Value Date   CHOL 263 (H) 10/13/2017   HDL 56 10/13/2017   LDLCALC 152 (H) 04/10/2017   TRIG 98 10/13/2017   CHOLHDL 4.7 10/13/2017     Start crestor , rept lab in 4 months

## 2017-10-16 NOTE — Patient Instructions (Addendum)
F/u in 4.5 months, call if you need me sooner  Condolence and prayers re your recent loss  Pls schedule your gyne exam  New for cholesterol is crestor one at bedtime, please reduce cheese, oils and eggs  Resume once daily vit D3 2000 IU  OTC, your vit D level is low  Blood sugar has increased slightly so pls cut out concentrated sweets, use natural sugar in fruit  Fasting lipid, cmp and eGFr, HBA1C  It is important that you exercise regularly at least 30 minutes 5 times a week. If you develop chest pain, have severe difficulty breathing, or feel very tired, stop exercising immediately and seek medical attention   Thank you  for choosing [facility]. We consider it a privelige to serve you.  Delivering excellent health care in a caring and  compassionate way is our goal.  Partnering with you,  so that together we can achieve this goal is our strategy.

## 2017-10-21 ENCOUNTER — Encounter: Payer: Self-pay | Admitting: Family Medicine

## 2017-10-21 NOTE — Assessment & Plan Note (Addendum)
Untreated , needs to commit to daily  vit D

## 2017-10-21 NOTE — Progress Notes (Signed)
Hannah Hays     MRN: [id number]      DOB: 1966/09/09   HPI Hannah Hays is here for follow up and re-evaluation of chronic medical conditions, medication management and review of any available recent lab and radiology data.  Preventive health is updated, specifically  Cancer screening and Immunization.   Questions or concerns regarding consultations or procedures which the PT has had in the interim are  addressed. The PT denies any adverse reactions to current medications since the last visit.  Currently grieving the loss of a sibling unexpectedly   ROS Denies recent fever or chills. Denies sinus pressure, nasal congestion, ear pain or sore throat. Denies chest congestion, productive cough or wheezing. Denies chest pains, palpitations and leg swelling Denies abdominal pain, nausea, vomiting,diarrhea or constipation.   Denies dysuria, frequency, hesitancy or incontinence. Denies joint pain, swelling and limitation in mobility. Denies headaches, seizures, numbness, or tingling. Denies depression, anxiety or insomnia. Denies skin break down or rash.   PE  BP 124/80   Pulse 91   Resp 16   Ht 5\' 6"  (1.676 m)   Wt 147 lb (66.7 kg)   SpO2 97%   BMI 23.73 kg/m   Patient alert and oriented and in no cardiopulmonary distress.  HEENT: No facial asymmetry, EOMI,   oropharynx pink and moist.  Neck supple no JVD, no mass.  Chest: Clear to auscultation bilaterally.  CVS: S1, S2 no murmurs, no S3.Regular rate.  ABD: Soft non tender.   Ext: No edema  MS: Adequate ROM spine, shoulders, hips and knees.  Skin: Intact, no ulcerations or rash noted.  Psych: Good eye contact, normal affect. Memory intact not anxious or depressed appearing.  CNS: CN 2-12 intact, power,  normal throughout.no focal deficits noted.   Assessment & Plan HTN (hypertension) Controlled, no change in medication DASH diet and commitment to daily physical activity for a minimum of 30 minutes discussed and  encouraged, as a part of hypertension management. The importance of attaining a healthy weight is also discussed.  BP/Weight 10/16/2017 08/28/2017 05/29/2017 04/11/2017 02/20/2017 10/11/2016 7/56/4332  Systolic BP 951 884 166 063 016 010 932  Diastolic BP 80 90 90 70 74 90 80  Wt. (Lbs) 147 - - 148 142.6 150 -  BMI 23.73 - - 23.89 23.02 24.21 -       IGT (impaired glucose tolerance) Deteriorated Patient educated about the importance of limiting  Carbohydrate intake , the need to commit to daily physical activity for a minimum of 30 minutes , and to commit weight loss. The fact that changes in all these areas will reduce or eliminate all together the development of diabetes is stressed.   Diabetic Labs Latest Ref Rng & Units 10/13/2017 04/10/2017 02/22/2016 09/16/2015 08/12/2015  HbA1c <5.7 % of total Hgb 5.9(H) 5.7(H) - - -  Chol <200 mg/dL 263(H) 221(H) 191 230 -  HDL >50 mg/dL 56 49(L) 50 - -  Calc LDL <100 mg/dL - 152(H) 128 - -  Triglycerides <150 mg/dL 98 99 67 - -  Creatinine 0.50 - 1.05 mg/dL 0.81 0.76 0.79 - 0.71   BP/Weight 10/16/2017 08/28/2017 05/29/2017 04/11/2017 02/20/2017 10/11/2016 3/55/7322  Systolic BP 025 427 062 376 283 151 761  Diastolic BP 80 90 90 70 74 90 80  Wt. (Lbs) 147 - - 148 142.6 150 -  BMI 23.73 - - 23.89 23.02 24.21 -   No flowsheet data found.    Hyperlipidemia LDL goal <100 Deteriorated Hyperlipidemia:Low fat  diet discussed and encouraged.   Lipid Panel  Lab Results  Component Value Date   CHOL 263 (H) 10/13/2017   HDL 56 10/13/2017   LDLCALC 152 (H) 04/10/2017   TRIG 98 10/13/2017   CHOLHDL 4.7 10/13/2017     Start crestor , rept lab in 4 months  Vitamin D deficiency Untreated , needs to commit to daily  vit D  Asthma Controlled, no change in medication Also followed by allergist  Perennial allergic rhinitis Controlled, no change in medication

## 2017-10-21 NOTE — Assessment & Plan Note (Signed)
Controlled, no change in medication  

## 2017-10-21 NOTE — Assessment & Plan Note (Signed)
Controlled, no change in medication Also followed by allergist

## 2017-10-23 ENCOUNTER — Ambulatory Visit (INDEPENDENT_AMBULATORY_CARE_PROVIDER_SITE_OTHER): Payer: BLUE CROSS/BLUE SHIELD

## 2017-10-23 DIAGNOSIS — J4540 Moderate persistent asthma, uncomplicated: Secondary | ICD-10-CM

## 2017-10-23 DIAGNOSIS — J4550 Severe persistent asthma, uncomplicated: Secondary | ICD-10-CM | POA: Diagnosis not present

## 2017-11-02 ENCOUNTER — Other Ambulatory Visit: Payer: Self-pay | Admitting: Allergy & Immunology

## 2017-11-14 ENCOUNTER — Other Ambulatory Visit: Payer: Self-pay | Admitting: Allergy & Immunology

## 2017-11-20 ENCOUNTER — Ambulatory Visit (INDEPENDENT_AMBULATORY_CARE_PROVIDER_SITE_OTHER): Payer: BLUE CROSS/BLUE SHIELD

## 2017-11-20 DIAGNOSIS — J4550 Severe persistent asthma, uncomplicated: Secondary | ICD-10-CM

## 2017-12-18 ENCOUNTER — Ambulatory Visit: Payer: Self-pay

## 2018-01-09 ENCOUNTER — Other Ambulatory Visit: Payer: Self-pay | Admitting: Family Medicine

## 2018-01-29 ENCOUNTER — Ambulatory Visit (INDEPENDENT_AMBULATORY_CARE_PROVIDER_SITE_OTHER): Payer: BLUE CROSS/BLUE SHIELD

## 2018-01-29 DIAGNOSIS — J4550 Severe persistent asthma, uncomplicated: Secondary | ICD-10-CM

## 2018-02-14 ENCOUNTER — Encounter: Payer: Self-pay | Admitting: Family Medicine

## 2018-02-14 ENCOUNTER — Other Ambulatory Visit: Payer: Self-pay | Admitting: Family Medicine

## 2018-02-14 ENCOUNTER — Ambulatory Visit (INDEPENDENT_AMBULATORY_CARE_PROVIDER_SITE_OTHER): Payer: BLUE CROSS/BLUE SHIELD | Admitting: Family Medicine

## 2018-02-14 ENCOUNTER — Ambulatory Visit: Payer: BLUE CROSS/BLUE SHIELD | Admitting: Family Medicine

## 2018-02-14 VITALS — BP 122/80 | HR 57 | Temp 98.1°F | Resp 16 | Ht 66.0 in | Wt 144.0 lb

## 2018-02-14 DIAGNOSIS — J3089 Other allergic rhinitis: Secondary | ICD-10-CM

## 2018-02-14 DIAGNOSIS — I10 Essential (primary) hypertension: Secondary | ICD-10-CM

## 2018-02-14 DIAGNOSIS — Z1231 Encounter for screening mammogram for malignant neoplasm of breast: Secondary | ICD-10-CM

## 2018-02-14 DIAGNOSIS — B9689 Other specified bacterial agents as the cause of diseases classified elsewhere: Secondary | ICD-10-CM

## 2018-02-14 DIAGNOSIS — E785 Hyperlipidemia, unspecified: Secondary | ICD-10-CM | POA: Diagnosis not present

## 2018-02-14 DIAGNOSIS — J209 Acute bronchitis, unspecified: Secondary | ICD-10-CM | POA: Diagnosis not present

## 2018-02-14 DIAGNOSIS — J019 Acute sinusitis, unspecified: Secondary | ICD-10-CM | POA: Diagnosis not present

## 2018-02-14 MED ORDER — METHYLPREDNISOLONE ACETATE 80 MG/ML IJ SUSP
80.0000 mg | Freq: Once | INTRAMUSCULAR | Status: AC
  Administered 2018-02-14: 80 mg via INTRAMUSCULAR

## 2018-02-14 MED ORDER — PREDNISONE 5 MG (21) PO TBPK: 5.0000 mg | ORAL_TABLET | ORAL | 0 refills | Status: DC

## 2018-02-14 MED ORDER — FLUTICASONE PROPIONATE 50 MCG/ACT NA SUSP: 2.0000 | Freq: Every day | NASAL | 3 refills | Status: DC

## 2018-02-14 MED ORDER — AZITHROMYCIN 250 MG PO TABS: ORAL_TABLET | ORAL | 0 refills | Status: DC

## 2018-02-14 NOTE — Progress Notes (Signed)
   Hannah Hays     MRN: [id number]      DOB: 04/10/1966   HPI Hannah Hays is here for follow up and re-evaluation of chronic medical conditions, medication management and review of any available recent lab and radiology data.  Preventive health is updated, specifically  Cancer screening and Immunization.    2 week h/o nasal congestion with sinus pressure, green drainage and chiills ROS  Denies chest pains, palpitations and leg swelling Denies abdominal pain, nausea, vomiting,diarrhea or constipation.   Denies dysuria, frequency, hesitancy or incontinence. Denies joint pain, swelling and limitation in mobility. Denies headaches, seizures, numbness, or tingling. Denies depression, anxiety or insomnia. Denies skin break down or rash.   PE  BP 122/80   Pulse (!) 57   Temp 98.1 F (36.7 C) (Oral)   Resp 16   Ht 5\' 6"  (1.676 m)   Wt 144 lb (65.3 kg)   SpO2 98%   BMI 23.24 kg/m   Patient alert and oriented and in no cardiopulmonary distress.  HEENT: No facial asymmetry, EOMI,   oropharynx pink and moist.  Neck supple no JVD, no mass.Maxillary sinus tenderness, excessive nasal congestion, edema and erythema TM clear bilaterally  Chest: clear, no crackles or wheezes  CVS: S1, S2 no murmurs, no S3.Regular rate.  ABD: Soft non tender.   Ext: No edema  MS: Adequate ROM spine, shoulders, hips and knees.  Skin: Intact, no ulcerations or rash noted.  Psych: Good eye contact, normal affect. Memory intact not anxious or depressed appearing.  CNS: CN 2-12 intact, power,  normal throughout.no focal deficits noted.   Assessment & Plan  Acute bacterial rhinosinusitis Antibiotic course prescribed. Additional nasal spray added and she is to continue saline nasal flushes   Acute bronchitis Decongestant and antibiotic prescribed  HTN (hypertension) Controlled, no change in medication DASH diet and commitment to daily physical activity for a minimum of 30 minutes discussed and  encouraged, as a part of hypertension management. The importance of attaining a healthy weight is also discussed.  BP/Weight 02/14/2018 10/16/2017 08/28/2017 05/29/2017 04/11/2017 02/20/2017 35/03/5624  Systolic BP 638 937 342 876 811 572 620  Diastolic BP 80 80 90 90 70 74 90  Wt. (Lbs) 144 147 - - 148 142.6 150  BMI 23.24 23.73 - - 23.89 23.02 24.21       Perennial allergic rhinitis Uncontrolled, depo medrol 80 mg IM in office and add daily flonase  Hyperlipidemia LDL goal <100 Hyperlipidemia:Low fat diet discussed and encouraged.   Lipid Panel  Lab Results  Component Value Date   CHOL 263 (H) 10/13/2017   HDL 56 10/13/2017   LDLCALC 185 (H) 10/13/2017   TRIG 98 10/13/2017   CHOLHDL 4.7 10/13/2017     Needs to reduce fat  In diet Updated lab needed at/ before next visit.

## 2018-02-14 NOTE — Patient Instructions (Signed)
F/u  End June, call if you need me sooner  You are treated for for uncontrolled allergic sinusitis  And sinus infection  Depo medrol 80 mg IM in office and prednisone and Z pack are prescribed  Pl;ease get fasting lipid, cmp and EGFr and HBa1C 5 days before next  Visit.  Blood pressure is good, congrats on weight loss.  Hope you improve soon  New is flonase , continue astelin and singulair  Please schedule pap, overdue

## 2018-02-16 ENCOUNTER — Encounter: Payer: Self-pay | Admitting: Family Medicine

## 2018-02-16 NOTE — Assessment & Plan Note (Signed)
Uncontrolled, depo medrol 80 mg IM in office and add daily flonase

## 2018-02-16 NOTE — Assessment & Plan Note (Signed)
Decongestant and antibiotic prescribed 

## 2018-02-16 NOTE — Assessment & Plan Note (Signed)
Hyperlipidemia:Low fat diet discussed and encouraged.   Lipid Panel  Lab Results  Component Value Date   CHOL 263 (H) 10/13/2017   HDL 56 10/13/2017   LDLCALC 185 (H) 10/13/2017   TRIG 98 10/13/2017   CHOLHDL 4.7 10/13/2017     Needs to reduce fat  In diet Updated lab needed at/ before next visit.

## 2018-02-16 NOTE — Assessment & Plan Note (Signed)
Antibiotic course prescribed. Additional nasal spray added and she is to continue saline nasal flushes

## 2018-02-16 NOTE — Assessment & Plan Note (Signed)
Controlled, no change in medication DASH diet and commitment to daily physical activity for a minimum of 30 minutes discussed and encouraged, as a part of hypertension management. The importance of attaining a healthy weight is also discussed.  BP/Weight 02/14/2018 10/16/2017 08/28/2017 05/29/2017 04/11/2017 02/20/2017 08/06/2354  Systolic BP 732 202 542 706 237 628 315  Diastolic BP 80 80 90 90 70 74 90  Wt. (Lbs) 144 147 - - 148 142.6 150  BMI 23.24 23.73 - - 23.89 23.02 24.21

## 2018-02-26 ENCOUNTER — Ambulatory Visit (INDEPENDENT_AMBULATORY_CARE_PROVIDER_SITE_OTHER): Payer: BLUE CROSS/BLUE SHIELD | Admitting: Allergy & Immunology

## 2018-02-26 ENCOUNTER — Encounter: Payer: Self-pay | Admitting: Allergy & Immunology

## 2018-02-26 VITALS — BP 132/78 | HR 76 | Resp 18

## 2018-02-26 DIAGNOSIS — J3089 Other allergic rhinitis: Secondary | ICD-10-CM

## 2018-02-26 DIAGNOSIS — J4540 Moderate persistent asthma, uncomplicated: Secondary | ICD-10-CM | POA: Diagnosis not present

## 2018-02-26 NOTE — Patient Instructions (Addendum)
1. Moderate persistent asthma, uncomplicated - Lung testing looked good today. - We will give you samples of Breo today to see if this will provide better coverage of your symptoms.  - Daily controller medication(s): Breo 200/25 one puff once daily + Singulair 10mg  daily + Fasenra every 8 weeks - Rescue medications: ProAir 4 puffs every 4-6 hours as needed - Asthma control goals:  * Full participation in all desired activities (may need albuterol before activity) * Albuterol use two time or less a week on average (not counting use with activity) * Cough interfering with sleep two time or less a month * Oral steroids no more than once a year * No hospitalizations  2. Chronic allergic rhinitis  - Continue with Qnasl 49mcg two puffs once daily. - Continue with nasal saline rinses 1-2 times daily.  - Continue with Singulair 10mg  daily.  3. Return in about 6 months (around 08/29/2018).   Please inform us of any Emergency Department visits, hospitalizations, or changes in symptoms. Call us before going to the ED for breathing or allergy symptoms since we might be able to fit you in for a sick visit. Feel free to contact us anytime with any questions, problems, or concerns.  It was a pleasure to see you again today!  Websites that have reliable patient information: 1. American Academy of Asthma, Allergy, and Immunology: www.aaaai.org 2. Food Allergy Research and Education (FARE): foodallergy.org 3. Mothers of Asthmatics: http://www.asthmacommunitynetwork.org 4. American College of Allergy, Asthma, and Immunology: www.acaai.org  Here's the information for the [place] [employer] researcher:   [email or web address]  Just reference Berna Bue and give my name. He'll guide you through the rest.

## 2018-02-26 NOTE — Progress Notes (Signed)
FOLLOW UP  Date of Service/Encounter:  02/26/18   Assessment:   Moderate persistent asthma, uncomplicated  Perennial allergic rhinitis   Asthma Reportables:  Severity: moderate persistent  Risk: low Control: well controlled  Plan/Recommendations:   1. Moderate persistent asthma, uncomplicated - Lung testing looked good today. - We will give you samples of Breo today to see if this will provide better coverage of your symptoms.  - Currently she is paying $40 per month for the Advair, so the Breo will be cheaper and easier for her.  - Daily controller medication(s): Breo 200/25 one puff once daily + Singulair 10mg  daily + Fasenra every 8 weeks - Rescue medications: ProAir 4 puffs every 4-6 hours as needed - Asthma control goals:  * Full participation in all desired activities (may need albuterol before activity) * Albuterol use two time or less a week on average (not counting use with activity) * Cough interfering with sleep two time or less a month * Oral steroids no more than once a year * No hospitalizations  2. Chronic allergic rhinitis  - Continue with Qnasl 11mcg two puffs once daily. - Continue with nasal saline rinses 1-2 times daily.  - Continue with Singulair 10mg  daily.  3. Return in about 6 months (around 08/29/2018).  Subjective:   Hannah Hays is a 52 y.o. female presenting today for follow up of  Chief Complaint  Patient presents with  . Asthma    she has been doing pretty good. Some sinus issues, but no problems with her asthma.     Hannah Hays has a history of the following: Patient Active Problem List   Diagnosis Date Noted  . Hyperlipidemia LDL goal <100 10/16/2017  . Perennial allergic rhinitis 08/28/2017  . Moderate persistent asthma, uncomplicated 43/32/9518  . Hx of adenomatous colonic polyps   . Lipoma 12/26/2015  . Liver mass, left lobe 02/11/2015  . Nasal polyposis 08/20/2014  . Chronic nonallergic rhinitis 08/20/2014  .  Vitamin D deficiency 07/12/2012  . HTN (hypertension) 08/19/2011  . GERD (gastroesophageal reflux disease) 04/11/2011  . IBS 12/19/2010  . Acute bacterial rhinosinusitis 01/19/2010  . IGT (impaired glucose tolerance) 12/26/2009  . Asthma 11/15/2007    History obtained from: chart review and patient.  Tollie Pizza Troiano's Primary Care Provider is [name], MD.     Hannah Hays is a 52 y.o. female presenting for a follow up visit. She was last seen in September 2018. At that time, we were fighting with her insurance company to get [facility] approved. We also added on Fasenra due to lack of asthma control. In the interim, we continued her on Advair 115/21 two puffs BID with Singulair 10mg  daily. For her allergic rhinitis, we continued her on Qnasl 54mcg two puffs once daily as well as nasal saline rinses, Singulair, and Zyrtec or Allegra daily.   Since the last visit, she has mostly done well. She did recently get diagnosed with a sinus infection two days ago and was started on an antibiotic. She was started on amoxicillin. She was also started on prednisone for her sinus infection. Overall she has been having fewer sinus problems since she started the sinus surgery. She is feeling better, but she does not endorse some wheezing today.   She remains on the Advair two puffs twice daily. She is on the [place] and feels fairly good with this. She is very happy with the Berna Bue and the copay assistance. She did start wheezing when she did  spirometry today. She is not needing her rescue inhaler often at all, she estimates once per week. Vaida's asthma has been well controlled. She has not required rescue medication, experienced nocturnal awakenings due to lower respiratory symptoms, nor have activities of daily living been limited. She has required no Emergency Department or Urgent Care visits for her asthma. She has required zero courses of systemic steroids for asthma exacerbations since the last visit. ACT  score today is 19, indicating excellent asthma symptom control.   Otherwise, there have been no changes to her past medical history, surgical history, family history, or social history. She continues her work and is expecting her 4th grandchild, due June 1st.     Review of Systems: a 14-point review of systems is pertinent for what is mentioned in HPI.  Otherwise, all other systems were negative. Constitutional: negative other than that listed in the HPI Eyes: negative other than that listed in the HPI Ears, nose, mouth, throat, and face: negative other than that listed in the HPI Respiratory: negative other than that listed in the HPI Cardiovascular: negative other than that listed in the HPI Gastrointestinal: negative other than that listed in the HPI Genitourinary: negative other than that listed in the HPI Integument: negative other than that listed in the HPI Hematologic: negative other than that listed in the HPI Musculoskeletal: negative other than that listed in the HPI Neurological: negative other than that listed in the HPI Allergy/Immunologic: negative other than that listed in the HPI    Objective:   Blood pressure 132/78, pulse 76, resp. rate 18, SpO2 98 %. There is no height or weight on file to calculate BMI.   Physical Exam:  General: Alert, interactive, in no acute distress. Smiling.  Eyes: No conjunctival injection bilaterally, no discharge on the right, no discharge on the left and no Horner-Trantas dots present. PERRL bilaterally. EOMI without pain. No photophobia.  Ears: Right TM pearly gray with normal light reflex, Left TM pearly gray with normal light reflex, Right TM intact without perforation and Left TM intact without perforation.  Nose/Throat: External nose within normal limits and septum midline. Turbinates edematous with clear discharge. Posterior oropharynx erythematous without cobblestoning in the posterior oropharynx. Tonsils 2+ without exudates.   Tongue without thrush. Lungs: Clear to auscultation without wheezing, rhonchi or rales. No increased work of breathing. CV: Normal S1/S2. No murmurs. Capillary refill <2 seconds.  Skin: Warm and dry, without lesions or rashes. Neuro:   Grossly intact. No focal deficits appreciated. Responsive to questions.  Diagnostic studies:   Spirometry: results normal (FEV1: 1.45/67%, FVC: 2.02/70%, FEV1/FVC: 71%).    Spirometry consistent with normal pattern. Overall stable compared to previous spiros.   Allergy Studies: none       [name], MD [facility]

## 2018-03-26 ENCOUNTER — Ambulatory Visit: Payer: BLUE CROSS/BLUE SHIELD

## 2018-03-26 ENCOUNTER — Ambulatory Visit (INDEPENDENT_AMBULATORY_CARE_PROVIDER_SITE_OTHER): Payer: BLUE CROSS/BLUE SHIELD

## 2018-03-26 DIAGNOSIS — J4550 Severe persistent asthma, uncomplicated: Secondary | ICD-10-CM

## 2018-03-27 DIAGNOSIS — Z130 Encounter for screening for diseases of the blood and blood-forming organs and certain disorders involving the immune mechanism: Secondary | ICD-10-CM | POA: Diagnosis not present

## 2018-03-27 DIAGNOSIS — Z1389 Encounter for screening for other disorder: Secondary | ICD-10-CM | POA: Diagnosis not present

## 2018-03-27 DIAGNOSIS — N959 Unspecified menopausal and perimenopausal disorder: Secondary | ICD-10-CM | POA: Diagnosis not present

## 2018-03-27 DIAGNOSIS — Z01419 Encounter for gynecological examination (general) (routine) without abnormal findings: Secondary | ICD-10-CM | POA: Diagnosis not present

## 2018-04-08 ENCOUNTER — Ambulatory Visit (HOSPITAL_COMMUNITY)
Admission: RE | Admit: 2018-04-08 | Discharge: 2018-04-08 | Disposition: A | Discharge status: Home / Self Care | Payer: BLUE CROSS/BLUE SHIELD | Source: Ambulatory Visit | Attending: Family Medicine | Admitting: Family Medicine

## 2018-04-08 ENCOUNTER — Encounter (HOSPITAL_COMMUNITY): Payer: Self-pay

## 2018-04-08 DIAGNOSIS — Z1231 Encounter for screening mammogram for malignant neoplasm of breast: Secondary | ICD-10-CM | POA: Diagnosis not present

## 2018-04-24 ENCOUNTER — Other Ambulatory Visit: Payer: Self-pay | Admitting: Family Medicine

## 2018-05-21 ENCOUNTER — Ambulatory Visit (INDEPENDENT_AMBULATORY_CARE_PROVIDER_SITE_OTHER): Payer: BLUE CROSS/BLUE SHIELD | Admitting: *Deleted

## 2018-05-21 DIAGNOSIS — J4550 Severe persistent asthma, uncomplicated: Secondary | ICD-10-CM | POA: Diagnosis not present

## 2018-05-29 ENCOUNTER — Encounter: Payer: Self-pay | Admitting: Family Medicine

## 2018-05-29 ENCOUNTER — Ambulatory Visit (INDEPENDENT_AMBULATORY_CARE_PROVIDER_SITE_OTHER): Payer: BLUE CROSS/BLUE SHIELD | Admitting: Family Medicine

## 2018-05-29 VITALS — BP 160/90 | HR 66 | Resp 16 | Ht 66.0 in | Wt 151.0 lb

## 2018-05-29 DIAGNOSIS — F320 Major depressive disorder, single episode, mild: Secondary | ICD-10-CM

## 2018-05-29 DIAGNOSIS — Z1331 Encounter for screening for depression: Secondary | ICD-10-CM

## 2018-05-29 DIAGNOSIS — R7302 Impaired glucose tolerance (oral): Secondary | ICD-10-CM

## 2018-05-29 DIAGNOSIS — E785 Hyperlipidemia, unspecified: Secondary | ICD-10-CM

## 2018-05-29 DIAGNOSIS — I10 Essential (primary) hypertension: Secondary | ICD-10-CM

## 2018-05-29 DIAGNOSIS — E559 Vitamin D deficiency, unspecified: Secondary | ICD-10-CM

## 2018-05-29 DIAGNOSIS — J310 Chronic rhinitis: Secondary | ICD-10-CM | POA: Diagnosis not present

## 2018-05-29 MED ORDER — SPIRONOLACTONE 25 MG PO TABS: 25.0000 mg | ORAL_TABLET | Freq: Every day | ORAL | 3 refills | Status: DC

## 2018-05-29 MED ORDER — AMLODIPINE BESYLATE 10 MG PO TABS: 10.0000 mg | ORAL_TABLET | Freq: Every day | ORAL | 1 refills | Status: DC

## 2018-05-29 NOTE — Patient Instructions (Addendum)
F/U in 8 to 10 weeks , call if you need me sooner  New additional medication for your blood pressure is spironolactone one daily  Please continue to commit to daily exercise, work on weight loss and low salt diet rich in fruit and vegetables  Increase sleep to 7 hr/ day  Labs today  lipid, cmp and EGFR, HBA1C and vit D   Thank you  for choosing [facility]. We consider it a privelige to serve you.  Delivering excellent health care in a caring and  compassionate way is our goal.  Partnering with you,  so that together we can achieve this goal is our strategy.

## 2018-05-29 NOTE — Progress Notes (Signed)
   Hannah Hays     MRN: [id number]      DOB: Jul 06, 1966   HPI Hannah Hays is here for follow up and re-evaluation of chronic medical conditions, medication management and review of any available recent lab and radiology data.  Preventive health is updated, specifically  Cancer screening and Immunization.   Has had gyne eval and is on low dose topical estrogen feels better and is under supervision  for hot flashes  The PT denies any adverse reactions to current medications since the last visit.  There are no new concerns.  There are no specific complaints  Does a lot to help  Others, may be getting insufficient rest for herself ROS Denies recent fever or chills. Denies sinus pressure, nasal congestion, ear pain or sore throat. Denies chest congestion, productive cough or wheezing. Denies chest pains, palpitations and leg swelling Denies abdominal pain, nausea, vomiting,diarrhea or constipation.   Denies dysuria, frequency, hesitancy or incontinence. Denies joint pain, swelling and limitation in mobility. Denies headaches, seizures, numbness, or tingling. Denies depression, anxiety or insomnia. Denies skin break down or rash.   PE  BP (!) 148/84   Pulse 66   Resp 16   Ht 5\' 6"  (1.676 m)   Wt 151 lb (68.5 kg)   SpO2 98%   BMI 24.37 kg/m   Patient alert and oriented and in no cardiopulmonary distress.  HEENT: No facial asymmetry, EOMI,   oropharynx pink and moist.  Neck supple no JVD, no mass.  Chest: Clear to auscultation bilaterally.  CVS: S1, S2 no murmurs, no S3.Regular rate.  ABD: Soft non tender.   Ext: No edema  MS: Adequate ROM spine, shoulders, hips and knees.  Skin: Intact, no ulcerations or rash noted.  Psych: Good eye contact, normal affect. Memory intact not anxious or depressed appearing.  CNS: CN 2-12 intact, power,  normal throughout.no focal deficits noted.   Assessment & Plan  HTN (hypertension) Uncontrolled, med adjustment made with 8 week  follow up DASH diet and commitment to daily physical activity for a minimum of 30 minutes discussed and encouraged, as a part of hypertension management. The importance of attaining a healthy weight is also discussed.  BP/Weight 05/29/2018 02/26/2018 02/14/2018 10/16/2017 08/28/2017 0/86/7619 5/0/9326  Systolic BP 712 458 099 833 825 053 976  Diastolic BP 90 78 80 80 90 90 70  Wt. (Lbs) 151 - 144 147 - - 148  BMI 24.37 - 23.24 23.73 - - 23.89       Hyperlipidemia LDL goal <100 Hyperlipidemia:Low fat diet discussed and encouraged.No med change, overall improved   Lipid Panel  Lab Results  Component Value Date   CHOL 190 05/29/2018   HDL 46 (L) 05/29/2018   LDLCALC 120 (H) 05/29/2018   TRIG 129 05/29/2018   CHOLHDL 4.1 05/29/2018   Need so increase exercise and lower fat intake, LDL elevated     Screening for depression Explored possibility of psychotherapy , not keen at this time but I am hesitant to add additional medication, will re address this at her next visit as her mental health is sub optimal, she is well intentioned bu clearly has difficultly saying no, establishing boundaries and caring for herself  Chronic nonallergic rhinitis Stable and currently controlled

## 2018-05-30 LAB — COMPLETE METABOLIC PANEL WITH GFR
AG Ratio: 1.5 (calc) (ref 1.0–2.5)
ALBUMIN MSPROF: 4.4 g/dL (ref 3.6–5.1)
ALT: 13 U/L (ref 6–29)
AST: 16 U/L (ref 10–35)
Alkaline phosphatase (APISO): 92 U/L (ref 33–130)
BUN: 8 mg/dL (ref 7–25)
CHLORIDE: 105 mmol/L (ref 98–110)
CO2: 28 mmol/L (ref 20–32)
CREATININE: 0.8 mg/dL (ref 0.50–1.05)
Calcium: 9.4 mg/dL (ref 8.6–10.4)
GFR, EST NON AFRICAN AMERICAN: 85 mL/min/{1.73_m2} (ref >=60)
GFR, Est African American: 98 mL/min/{1.73_m2} (ref >=60)
GLOBULIN: 2.9 g/dL (ref 1.9–3.7)
GLUCOSE: 91 mg/dL (ref 65–99)
Potassium: 3.9 mmol/L (ref 3.5–5.3)
SODIUM: 141 mmol/L (ref 135–146)
Total Bilirubin: 0.6 mg/dL (ref 0.2–1.2)
Total Protein: 7.3 g/dL (ref 6.1–8.1)

## 2018-05-30 LAB — HEMOGLOBIN A1C
HEMOGLOBIN A1C: 5.8 %{Hb} — AB (ref <5.7)
Mean Plasma Glucose: 120 (calc)
eAG (mmol/L): 6.6 (calc)

## 2018-05-30 LAB — LIPID PANEL
CHOL/HDL RATIO: 4.1 (calc) (ref <5.0)
Cholesterol: 190 mg/dL (ref <200)
HDL: 46 mg/dL — ABNORMAL LOW (ref >50)
LDL Cholesterol (Calc): 120 mg/dL (calc) — ABNORMAL HIGH
Non-HDL Cholesterol (Calc): 144 mg/dL (calc) — ABNORMAL HIGH (ref <130)
Triglycerides: 129 mg/dL (ref <150)

## 2018-05-30 LAB — VITAMIN D 25 HYDROXY (VIT D DEFICIENCY, FRACTURES): VIT D 25 HYDROXY: 37 ng/mL (ref 30–100)

## 2018-05-31 DIAGNOSIS — N959 Unspecified menopausal and perimenopausal disorder: Secondary | ICD-10-CM | POA: Diagnosis not present

## 2018-06-02 ENCOUNTER — Encounter: Payer: Self-pay | Admitting: Family Medicine

## 2018-06-02 DIAGNOSIS — F320 Major depressive disorder, single episode, mild: Secondary | ICD-10-CM | POA: Insufficient documentation

## 2018-06-02 DIAGNOSIS — Z1331 Encounter for screening for depression: Secondary | ICD-10-CM | POA: Insufficient documentation

## 2018-06-02 NOTE — Assessment & Plan Note (Signed)
Uncontrolled, med adjustment made with 8 week follow up DASH diet and commitment to daily physical activity for a minimum of 30 minutes discussed and encouraged, as a part of hypertension management. The importance of attaining a healthy weight is also discussed.  BP/Weight 05/29/2018 02/26/2018 02/14/2018 10/16/2017 08/28/2017 9/79/8921 12/12/4172  Systolic BP 081 448 185 631 497 026 378  Diastolic BP 90 78 80 80 90 90 70  Wt. (Lbs) 151 - 144 147 - - 148  BMI 24.37 - 23.24 23.73 - - 23.89

## 2018-06-02 NOTE — Assessment & Plan Note (Signed)
Needs to be furthers addressed at next visit

## 2018-06-02 NOTE — Assessment & Plan Note (Signed)
Stable and currently controlled

## 2018-06-02 NOTE — Assessment & Plan Note (Signed)
Explored possibility of psychotherapy , not keen at this time but I am hesitant to add additional medication, will re address this at her next visit as her mental health is sub optimal, she is well intentioned bu clearly has difficultly saying no, establishing boundaries and caring for herself

## 2018-06-02 NOTE — Assessment & Plan Note (Addendum)
Hyperlipidemia:Low fat diet discussed and encouraged.No med change, overall improved   Lipid Panel  Lab Results  Component Value Date   CHOL 190 05/29/2018   HDL 46 (L) 05/29/2018   LDLCALC 120 (H) 05/29/2018   TRIG 129 05/29/2018   CHOLHDL 4.1 05/29/2018   Need so increase exercise and lower fat intake, LDL elevated

## 2018-07-09 ENCOUNTER — Other Ambulatory Visit: Payer: Self-pay | Admitting: Allergy & Immunology

## 2018-07-16 ENCOUNTER — Ambulatory Visit (INDEPENDENT_AMBULATORY_CARE_PROVIDER_SITE_OTHER): Payer: BLUE CROSS/BLUE SHIELD | Admitting: *Deleted

## 2018-07-16 DIAGNOSIS — J4540 Moderate persistent asthma, uncomplicated: Secondary | ICD-10-CM | POA: Diagnosis not present

## 2018-08-07 ENCOUNTER — Ambulatory Visit (INDEPENDENT_AMBULATORY_CARE_PROVIDER_SITE_OTHER): Payer: BLUE CROSS/BLUE SHIELD | Admitting: Family Medicine

## 2018-08-07 ENCOUNTER — Encounter: Payer: Self-pay | Admitting: Family Medicine

## 2018-08-07 VITALS — BP 128/82 | HR 83 | Resp 16 | Ht 66.0 in | Wt 147.0 lb

## 2018-08-07 DIAGNOSIS — J4540 Moderate persistent asthma, uncomplicated: Secondary | ICD-10-CM | POA: Diagnosis not present

## 2018-08-07 DIAGNOSIS — J3089 Other allergic rhinitis: Secondary | ICD-10-CM | POA: Diagnosis not present

## 2018-08-07 DIAGNOSIS — G4700 Insomnia, unspecified: Secondary | ICD-10-CM

## 2018-08-07 DIAGNOSIS — E785 Hyperlipidemia, unspecified: Secondary | ICD-10-CM

## 2018-08-07 DIAGNOSIS — I10 Essential (primary) hypertension: Secondary | ICD-10-CM

## 2018-08-07 MED ORDER — MIRTAZAPINE 15 MG PO TABS: ORAL_TABLET | ORAL | 5 refills | Status: DC

## 2018-08-07 MED ORDER — SPIRONOLACTONE 25 MG PO TABS: 25.0000 mg | ORAL_TABLET | Freq: Every day | ORAL | 5 refills | Status: DC

## 2018-08-07 MED ORDER — MIRTAZAPINE 15 MG PO TABS: ORAL_TABLET | ORAL | 3 refills | Status: DC

## 2018-08-07 MED ORDER — FLUTICASONE PROPIONATE 50 MCG/ACT NA SUSP: 2.0000 | Freq: Every day | NASAL | 3 refills | Status: DC

## 2018-08-07 MED ORDER — ROSUVASTATIN CALCIUM 20 MG PO TABS: 20.0000 mg | ORAL_TABLET | Freq: Every day | ORAL | 1 refills | Status: DC

## 2018-08-07 MED ORDER — AMLODIPINE BESYLATE 10 MG PO TABS: 10.0000 mg | ORAL_TABLET | Freq: Every day | ORAL | 1 refills | Status: DC

## 2018-08-07 NOTE — Assessment & Plan Note (Signed)
Improved but not at goal, continue current med and low fat diet. rept lab in December Hyperlipidemia:Low fat diet discussed and encouraged.   Lipid Panel  Lab Results  Component Value Date   CHOL 190 05/29/2018   HDL 46 (L) 05/29/2018   LDLCALC 120 (H) 05/29/2018   TRIG 129 05/29/2018   CHOLHDL 4.1 05/29/2018

## 2018-08-07 NOTE — Assessment & Plan Note (Signed)
Controlled, no change in medication DASH diet and commitment to daily physical activity for a minimum of 30 minutes discussed and encouraged, as a part of hypertension management. The importance of attaining a healthy weight is also discussed.  BP/Weight 08/07/2018 05/29/2018 02/26/2018 02/14/2018 10/16/2017 08/28/2017 06/20/5500  Systolic BP 586 825 749 355 217 471 595  Diastolic BP 82 90 78 80 80 90 90  Wt. (Lbs) 147 151 - 144 147 - -  BMI 23.73 24.37 - 23.24 23.73 - -

## 2018-08-07 NOTE — Patient Instructions (Addendum)
F./U in 5 months, call if you need me before  Check on Friday for flu vaccine   Fasting CBC, tSH. Lipid, cmp and eGFR and tSH first week in December  All medication will be sent to [facility] on [address] per your request and CA is  Being removed  Congrats on healthy weight and good health habite  Thank you  for choosing  Primary Care. We consider it a privelige to serve you.  Delivering excellent health care in a caring and  compassionate way is our goal.  Partnering with you,  so that together we can achieve this goal is our strategy.

## 2018-08-07 NOTE — Assessment & Plan Note (Signed)
controlled on medication continue same

## 2018-08-07 NOTE — Addendum Note (Signed)
Addended by: Eual Fines on: 08/07/2018 09:59 AM   Modules accepted: Orders

## 2018-08-07 NOTE — Assessment & Plan Note (Signed)
Controlled when she is able to take medication. 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 Prescription sent for  medication needed.

## 2018-08-07 NOTE — Progress Notes (Signed)
   Hannah Hays     MRN: [id number]      DOB: 10-30-1966   HPI Hannah Hays is here for follow up and re-evaluation of chronic medical conditions, medication management and review of any available recent lab and radiology data.  Preventive health is updated, specifically  Cancer screening and Immunization.   Questions or concerns regarding consultations or procedures which the PT has had in the interim are  addressed. The PT denies any adverse reactions to current medications since the last visit.  There are no new concerns.  There are no specific complaints   ROS Denies recent fever or chills. Denies sinus pressure, nasal congestion, ear pain or sore throat. Denies chest congestion, productive cough or wheezing. Denies chest pains, palpitations and leg swelling Denies abdominal pain, nausea, vomiting,diarrhea or constipation.   Denies dysuria, frequency, hesitancy or incontinence. Denies joint pain, swelling and limitation in mobility. Denies headaches, seizures, numbness, or tingling. Denies depression, anxiety or insomnia. Denies skin break down or rash.   PE  BP 138/82   Pulse 83   Resp 16   Ht 5\' 6"  (1.676 m)   Wt 147 lb (66.7 kg)   SpO2 98%   BMI 23.73 kg/m   Patient alert and oriented and in no cardiopulmonary distress.  HEENT: No facial asymmetry, EOMI,   oropharynx pink and moist.  Neck supple no JVD, no mass.  Chest: Clear to auscultation bilaterally.  CVS: S1, S2 no murmurs, no S3.Regular rate.  ABD: Soft non tender.   Ext: No edema  MS: Adequate ROM spine, shoulders, hips and knees.  Skin: Intact, no ulcerations or rash noted.  Psych: Good eye contact, normal affect. Memory intact not anxious or depressed appearing.  CNS: CN 2-12 intact, power,  normal throughout.no focal deficits noted.   Assessment & Plan  HTN (hypertension) Controlled, no change in medication DASH diet and commitment to daily physical activity for a minimum of 30 minutes  discussed and encouraged, as a part of hypertension management. The importance of attaining a healthy weight is also discussed.  BP/Weight 08/07/2018 05/29/2018 02/26/2018 02/14/2018 10/16/2017 08/28/2017 2/62/0355  Systolic BP 974 163 845 364 680 321 224  Diastolic BP 82 90 78 80 80 90 90  Wt. (Lbs) 147 151 - 144 147 - -  BMI 23.73 24.37 - 23.24 23.73 - -       Hyperlipidemia LDL goal <100 Improved but not at goal, continue current med and low fat diet. rept lab in December Hyperlipidemia:Low fat diet discussed and encouraged.   Lipid Panel  Lab Results  Component Value Date   CHOL 190 05/29/2018   HDL 46 (L) 05/29/2018   LDLCALC 120 (H) 05/29/2018   TRIG 129 05/29/2018   CHOLHDL 4.1 05/29/2018       Moderate persistent asthma, uncomplicated controlled on medication continue same  Perennial allergic rhinitis Controlled on medication, continue  same  Insomnia Controlled when she is able to take medication. 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 Prescription sent for  medication needed.

## 2018-08-07 NOTE — Assessment & Plan Note (Signed)
Controlled on medication , continue same ?

## 2018-08-27 ENCOUNTER — Ambulatory Visit (INDEPENDENT_AMBULATORY_CARE_PROVIDER_SITE_OTHER): Payer: BLUE CROSS/BLUE SHIELD | Admitting: Allergy & Immunology

## 2018-08-27 ENCOUNTER — Encounter: Payer: Self-pay | Admitting: Allergy & Immunology

## 2018-08-27 VITALS — BP 146/70 | HR 84 | Temp 98.2°F | Resp 18 | Ht 65.47 in | Wt 150.4 lb

## 2018-08-27 DIAGNOSIS — J3089 Other allergic rhinitis: Secondary | ICD-10-CM

## 2018-08-27 DIAGNOSIS — J4540 Moderate persistent asthma, uncomplicated: Secondary | ICD-10-CM

## 2018-08-27 MED ORDER — BUDESONIDE-FORMOTEROL FUMARATE 160-4.5 MCG/ACT IN AERO: 2.0000 | INHALATION_SPRAY | Freq: Two times a day (BID) | RESPIRATORY_TRACT | 5 refills | Status: DC

## 2018-08-27 NOTE — Patient Instructions (Addendum)
1. Moderate persistent asthma, uncomplicated - Lung testing looked good today.  - We will change you back to Symbicort 160/4.5 mcg one puff twice daily - Daily controller medication(s): Symbicort 160/4.5 one puff twice daily + Singulair 10mg  daily + Fasenra every 8 weeks - Rescue medications: ProAir 4 puffs every 4-6 hours as needed - Asthma control goals:  * Full participation in all desired activities (may need albuterol before activity) * Albuterol use two time or less a week on average (not counting use with activity) * Cough interfering with sleep two time or less a month * Oral steroids no more than once a year * No hospitalizations  2. Chronic allergic rhinitis  - Continue with Qnasl 58mcg two puffs once daily. - Continue with nasal saline rinses 1-2 times daily.  - Continue with Singulair 10mg  daily.  3. Return in about 6 months (around 02/25/2019).   Please inform us of any Emergency Department visits, hospitalizations, or changes in symptoms. Call us before going to the ED for breathing or allergy symptoms since we might be able to fit you in for a sick visit. Feel free to contact us anytime with any questions, problems, or concerns.  It was a pleasure to see you again today!  Websites that have reliable patient information: 1. American Academy of Asthma, Allergy, and Immunology: www.aaaai.org 2. Food Allergy Research and Education (FARE): foodallergy.org 3. Mothers of Asthmatics: http://www.asthmacommunitynetwork.org 4. American College of Allergy, Asthma, and Immunology: www.acaai.org  Here's the information for the [place] researcher:   Verlon [email or web address]  Just reference Berna Bue and give my name. He'll guide you through the rest.

## 2018-08-27 NOTE — Progress Notes (Signed)
FOLLOW UP  Date of Service/Encounter:  08/27/18   Assessment:   Moderate persistent asthma, uncomplicated  Perennial allergic rhinitis   Asthma Reportables:  Severity: moderate persistent  Risk: low Control: well controlled   Plan/Recommendations:   1. Moderate persistent asthma, uncomplicated - Lung testing looked good today.  - We will change you back to Symbicort 160/4.5 mcg one puff twice daily - Daily controller medication(s): Symbicort 160/4.5 one puff twice daily + Singulair 10mg  daily + Fasenra every 8 weeks - Rescue medications: ProAir 4 puffs every 4-6 hours as needed - Asthma control goals:  * Full participation in all desired activities (may need albuterol before activity) * Albuterol use two time or less a week on average (not counting use with activity) * Cough interfering with sleep two time or less a month * Oral steroids no more than once a year * No hospitalizations  2. Chronic allergic rhinitis  - Continue with Qnasl 46mcg two puffs once daily. - Continue with nasal saline rinses 1-2 times daily.  - Continue with Singulair 10mg  daily.  3. Return in about 6 months (around 02/25/2019).  Subjective:   Hannah Hays is a 52 y.o. female presenting today for follow up of  Chief Complaint  Patient presents with  . Follow-up    Hannah Hays has a history of the following: Patient Active Problem List   Diagnosis Date Noted  . Insomnia 08/07/2018  . Hyperlipidemia LDL goal <100 10/16/2017  . Perennial allergic rhinitis 08/28/2017  . Moderate persistent asthma, uncomplicated 40/98/1191  . Hx of adenomatous colonic polyps   . Lipoma 12/26/2015  . Liver mass, left lobe 02/11/2015  . Nasal polyposis 08/20/2014  . Chronic nonallergic rhinitis 08/20/2014  . Vitamin D deficiency 07/12/2012  . HTN (hypertension) 08/19/2011  . GERD (gastroesophageal reflux disease) 04/11/2011  . IBS 12/19/2010  . IGT (impaired glucose tolerance) 12/26/2009  .  Asthma 11/15/2007    History obtained from: chart review and patient.  Hannah Hays's Primary Care Provider is [name], MD.     Hannah Hays is a 52 y.o. female presenting for a follow up visit.  [name] was last seen in March 2019.  At that time, we gave her samples of Breo to replace her Advair.  We kept her on Fasenra every 8 weeks as well as Singulair 10 mg daily.  Her allergic rhinitis had been well controlled with Qnasl 80 mcg 2 puffs once daily as well as nasal saline rinses and Singulair.  Since the last visit, she has been doing relatively well. She reports that her breathing is doing well. She is on Symbicort 2 puffs 2x/day, and Pro-Air. She no longer uses the [facility] since it was too expensive. She reports that she uses the Pro-Air 1-2/month. She is sleeping most of the day because she works 3rd shift, from 10PM-7 AM. She reports that she is sleeping well and does not wake up from shortness of breath. No wheezing, shortness of breath, chest tightness. No hospitalizations, ER visits. She did have a bad cold right before her last visit which is resolved now. Fasenra injections are going well and overall and she feels that this has been providing excellent control of her symptoms.    In regards to her allergic rhinitis she is on Qnasl 50mcg two puffs once daily, nasal saline rinses, Xyxal twice a week, and Singulair daily. She has not been outside a lot. She denies any sneezing, runny nose, congestion, teary eyes. She  reports that she has been feeling good and that it feels like her symptoms are well controlled.   She did have a headache yesterday after not eating anything all day, cleared up after eating, using Aleve, and sleeping. She reports that she has been having some more acid reflux recently, had some regurgitation after eating and drinking some juice. She is on Nexium PRN and she has been using it more often. She denies any fevers, chills, nausea, vomiting, chest pain, shortness  of breath, and wheezing.   Otherwise, there have been no changes to her past medical history, surgical history, family history, or social history. She had her 5th grand child in June and she is clearly beyond the moon about this.    Review of Systems: a 14-point review of systems is pertinent for what is mentioned in HPI.  Otherwise, all other systems were negative. Constitutional: negative other than that listed in the HPI Eyes: negative other than that listed in the HPI Ears, nose, mouth, throat, and face: negative other than that listed in the HPI Respiratory: negative other than that listed in the HPI Cardiovascular: negative other than that listed in the HPI Gastrointestinal: negative other than that listed in the HPI Genitourinary: negative other than that listed in the HPI Integument: negative other than that listed in the HPI Hematologic: negative other than that listed in the HPI Musculoskeletal: negative other than that listed in the HPI Neurological: negative other than that listed in the HPI Allergy/Immunologic: negative other than that listed in the HPI    Objective:   Blood pressure (!) 146/70, pulse 84, temperature 98.2 F (36.8 C), temperature source Oral, resp. rate 18, height 5' 5.47" (1.663 m), weight 150 lb 6.4 oz (68.2 kg), SpO2 98 %. Body mass index is 24.67 kg/m.   Physical Exam:  General: Alert, interactive, in no acute distress. Pleasant female.  Eyes: No conjunctival injection bilaterally, no discharge on the right, no discharge on the left and no Horner-Trantas dots present. PERRL bilaterally. EOMI without pain. No photophobia.  Ears: Right TM pearly gray with normal light reflex, Left TM pearly gray with normal light reflex, Right TM intact without perforation and Left TM intact without perforation.  Nose/Throat: External nose within normal limits and septum midline. Turbinates edematous with clear discharge. Posterior oropharynx erythematous with  cobblestoning in the posterior oropharynx. Tonsils 2+ without exudates.  Tongue without thrush. Lungs: Clear to auscultation without wheezing, rhonchi or rales. No increased work of breathing. CV: Normal S1/S2. No murmurs. Capillary refill <2 seconds.  Skin: Warm and dry, without lesions or rashes. Neuro:   Grossly intact. No focal deficits appreciated. Responsive to questions.  Diagnostic studies:   Spirometry: results normal (FEV1: 1.87/86%, FVC: 2.62/92%, FEV1/FVC: 71%).    Spirometry consistent with normal pattern.   Allergy Studies: none     [name], MD  [facility]

## 2018-08-30 ENCOUNTER — Ambulatory Visit (INDEPENDENT_AMBULATORY_CARE_PROVIDER_SITE_OTHER): Payer: BLUE CROSS/BLUE SHIELD

## 2018-08-30 DIAGNOSIS — Z23 Encounter for immunization: Secondary | ICD-10-CM

## 2018-09-10 ENCOUNTER — Ambulatory Visit (INDEPENDENT_AMBULATORY_CARE_PROVIDER_SITE_OTHER): Payer: BLUE CROSS/BLUE SHIELD

## 2018-09-10 DIAGNOSIS — J4550 Severe persistent asthma, uncomplicated: Secondary | ICD-10-CM

## 2018-10-18 ENCOUNTER — Other Ambulatory Visit: Payer: Self-pay | Admitting: Allergy & Immunology

## 2018-11-06 ENCOUNTER — Ambulatory Visit (INDEPENDENT_AMBULATORY_CARE_PROVIDER_SITE_OTHER): Payer: BLUE CROSS/BLUE SHIELD

## 2018-11-06 DIAGNOSIS — J4550 Severe persistent asthma, uncomplicated: Secondary | ICD-10-CM

## 2018-11-18 ENCOUNTER — Telehealth: Payer: Self-pay | Admitting: *Deleted

## 2018-11-18 NOTE — Telephone Encounter (Signed)
I spoke with pt pls add her  To schedule tomorrow at 11 am, dx is sinusitis

## 2018-11-18 NOTE — Telephone Encounter (Signed)
Pt would like something called in for her sinuses. Shes had a headache for a week. She also has a lot of drainage. Not so much chest congestion. More in the head.

## 2018-11-19 ENCOUNTER — Ambulatory Visit (INDEPENDENT_AMBULATORY_CARE_PROVIDER_SITE_OTHER): Payer: BLUE CROSS/BLUE SHIELD | Admitting: Family Medicine

## 2018-11-19 ENCOUNTER — Encounter: Payer: Self-pay | Admitting: Family Medicine

## 2018-11-19 VITALS — BP 138/78 | HR 79 | Resp 12 | Ht 65.5 in | Wt 152.0 lb

## 2018-11-19 DIAGNOSIS — J3089 Other allergic rhinitis: Secondary | ICD-10-CM | POA: Diagnosis not present

## 2018-11-19 DIAGNOSIS — I10 Essential (primary) hypertension: Secondary | ICD-10-CM

## 2018-11-19 DIAGNOSIS — J4550 Severe persistent asthma, uncomplicated: Secondary | ICD-10-CM

## 2018-11-19 DIAGNOSIS — J0100 Acute maxillary sinusitis, unspecified: Secondary | ICD-10-CM | POA: Insufficient documentation

## 2018-11-19 MED ORDER — METHYLPREDNISOLONE ACETATE 80 MG/ML IJ SUSP
80.0000 mg | Freq: Once | INTRAMUSCULAR | Status: AC
  Administered 2018-11-19: 80 mg via INTRAMUSCULAR

## 2018-11-19 MED ORDER — FLUCONAZOLE 150 MG PO TABS: ORAL_TABLET | ORAL | 0 refills | Status: DC

## 2018-11-19 MED ORDER — METHYLPREDNISOLONE ACETATE 80 MG/ML IJ SUSP: 80.0000 mg | Freq: Once | INTRAMUSCULAR | 0 refills | Status: AC

## 2018-11-19 MED ORDER — PENICILLIN V POTASSIUM 500 MG PO TABS: 500.0000 mg | ORAL_TABLET | Freq: Three times a day (TID) | ORAL | 0 refills | Status: DC

## 2018-11-19 MED ORDER — PREDNISONE 10 MG (21) PO TBPK: ORAL_TABLET | ORAL | 0 refills | Status: AC

## 2018-11-19 NOTE — Assessment & Plan Note (Addendum)
Pen v prescribed f with fluconaxzole and advised saline sprays

## 2018-11-19 NOTE — Progress Notes (Signed)
   Hannah Hays     MRN: [id number]      DOB: September 04, 1966   HPI Hannah Hays is here with a 5 day h/o right maxillary pressure , intermittent chills , yellow green nasal drainage and right ear pressure C/o exertional dyspnea, has to strop and rest due to cough and wheeze x 1 week, using neb rtreatments ROS  Denies chest pains, palpitations and leg swelling Denies abdominal pain, nausea, vomiting,diarrhea or constipation.   Denies dysuria, frequency, hesitancy or incontinence. Denies joint pain, swelling and limitation in mobility. Denies headaches, seizures, numbness, or tingling. Denies depression, anxiety or insomnia. Denies skin break down or rash.   PE BP 138/78   Pulse 79   Resp 12   Ht 5' 5.5" (1.664 m)   Wt 152 lb 0.6 oz (69 kg)   SpO2 96% Comment: room air  BMI 24.92 kg/m  Patient alert and oriented and in no cardiopulmonary distress.  HEENT: No facial asymmetry, EOMI,   oropharynx pink and moist.  Neck supple no JVD, no mass.Nasla mucosa edematous and erythematous, excess nasal drainage, right maxillary sinus tender  Chest: decreased air entry , scattered wheezes, no crackles  CVS: S1, S2 no murmurs, no S3.Regular rate.  ABD: Soft non tender.   Ext: No edema  MS: Adequate ROM spine, shoulders, hips and knees.  Skin: Intact, no ulcerations or rash noted.  Psych: Good eye contact, normal affect. Memory intact not anxious or depressed appearing.  CNS: CN 2-12 intact, power,  normal throughout.no focal deficits noted.   Assessment & Plan  Perennial allergic rhinitis Uncontrolled x 1 week, depo medrol 80 mg iM followed by prednisone 10 mg dose pack  Sinusitis, acute maxillary Pen v prescribed f with fluconaxzole and advised saline sprays  Asthma 1 week flare,, prednisone and increased neb treatments, also depo medrol 80 mg iM in office, handicap sticker provided  HTN (hypertension) Controlled, no change in medication DASH diet and commitment to daily  physical activity for a minimum of 30 minutes discussed and encouraged, as a part of hypertension management. The importance of attaining a healthy weight is also discussed.  BP/Weight 11/19/2018 08/27/2018 08/07/2018 05/29/2018 02/26/2018 02/14/2018 09/09/1218  Systolic BP 758 832 549 [phone]  Diastolic BP 78 70 82 90 78 80 80  Wt. (Lbs) 152.04 150.4 147 151 - 144 147  BMI 24.92 24.67 23.73 24.37 - 23.24 23.73

## 2018-11-19 NOTE — Assessment & Plan Note (Addendum)
1 week flare,, prednisone and increased neb treatments, also depo medrol 80 mg iM in office, handicap sticker provided

## 2018-11-19 NOTE — Assessment & Plan Note (Signed)
Uncontrolled x 1 week, depo medrol 80 mg iM followed by prednisone 10 mg dose pack

## 2018-11-19 NOTE — Patient Instructions (Signed)
F/u as before, call if you need me sooner  You are treated for acute right maxillary sinusitis, uncontrolled asthma and uncontrolled allergies  Depo medrol is administerted in the office , and 3 medications, prednisone, penicillin and fluconazole are prescribed  Handicap sticker application provided to you today

## 2018-11-20 ENCOUNTER — Encounter: Payer: Self-pay | Admitting: Family Medicine

## 2018-11-20 NOTE — Assessment & Plan Note (Signed)
Controlled, no change in medication DASH diet and commitment to daily physical activity for a minimum of 30 minutes discussed and encouraged, as a part of hypertension management. The importance of attaining a healthy weight is also discussed.  BP/Weight 11/19/2018 08/27/2018 08/07/2018 05/29/2018 02/26/2018 02/14/2018 24/46/9507  Systolic BP 225 750 518 335 825 189 842  Diastolic BP 78 70 82 90 78 80 80  Wt. (Lbs) 152.04 150.4 147 151 - 144 147  BMI 24.92 24.67 23.73 24.37 - 23.24 23.73

## 2018-11-21 ENCOUNTER — Telehealth: Payer: Self-pay | Admitting: Family Medicine

## 2018-11-21 NOTE — Telephone Encounter (Signed)
Spoke with pharmacy and they had received a script for Depo Medrol injection. Injection had been given in the office. Told them this could be cancelled.

## 2018-11-21 NOTE — Telephone Encounter (Signed)
Larene Beach with [facility] is calling regarding the RX called in, and believes it should be under Medical not RX, would like you to call her back [phone]

## 2019-01-01 ENCOUNTER — Ambulatory Visit (INDEPENDENT_AMBULATORY_CARE_PROVIDER_SITE_OTHER): Payer: BLUE CROSS/BLUE SHIELD | Admitting: *Deleted

## 2019-01-01 DIAGNOSIS — J4540 Moderate persistent asthma, uncomplicated: Secondary | ICD-10-CM

## 2019-01-01 DIAGNOSIS — J4550 Severe persistent asthma, uncomplicated: Secondary | ICD-10-CM

## 2019-01-07 ENCOUNTER — Encounter: Payer: Self-pay | Admitting: Family Medicine

## 2019-01-07 ENCOUNTER — Ambulatory Visit (INDEPENDENT_AMBULATORY_CARE_PROVIDER_SITE_OTHER): Payer: BLUE CROSS/BLUE SHIELD | Admitting: Family Medicine

## 2019-01-07 VITALS — BP 110/80 | HR 69 | Resp 14 | Ht 65.5 in | Wt 149.1 lb

## 2019-01-07 DIAGNOSIS — Z23 Encounter for immunization: Secondary | ICD-10-CM | POA: Diagnosis not present

## 2019-01-07 DIAGNOSIS — E559 Vitamin D deficiency, unspecified: Secondary | ICD-10-CM

## 2019-01-07 DIAGNOSIS — E785 Hyperlipidemia, unspecified: Secondary | ICD-10-CM

## 2019-01-07 DIAGNOSIS — R7302 Impaired glucose tolerance (oral): Secondary | ICD-10-CM

## 2019-01-07 DIAGNOSIS — G4700 Insomnia, unspecified: Secondary | ICD-10-CM

## 2019-01-07 DIAGNOSIS — I10 Essential (primary) hypertension: Secondary | ICD-10-CM

## 2019-01-07 DIAGNOSIS — J4540 Moderate persistent asthma, uncomplicated: Secondary | ICD-10-CM | POA: Diagnosis not present

## 2019-01-07 NOTE — Patient Instructions (Addendum)
F/UI in 6 months, call if you need me before  Nurse visit only for Shingrix # 2 in 3 months please  Shingrix #1 today  Please get fasting lipid, cmp and EGFr, HBA1C, CBC , TSH and vit D as soon as possible  It is important that you exercise regularly at least 30 minutes 5 times a week. If you develop chest pain, have severe difficulty breathing, or feel very tired, stop exercising immediately and seek medical attention    Think about what you will eat, plan ahead. Choose " clean, green, fresh or frozen" over canned, processed or packaged foods which are more sugary, salty and fatty. 70 to 75% of food eaten should be vegetables and fruit. Three meals at set times with snacks allowed between meals, but they must be fruit or vegetables. Aim to eat over a 12 hour period , example 7 am to 7 pm, and STOP after  your last meal of the day. Drink water,generally about 64 ounces per day, no other drink is as healthy. Fruit juice is best enjoyed in a healthy way, by EATING the fruit.

## 2019-01-07 NOTE — Assessment & Plan Note (Signed)
Controlled, no change in medication  

## 2019-01-07 NOTE — Assessment & Plan Note (Signed)
After obtaining informed consent, the vaccine is  administered , with no adverse effect noted at the time of administration.  

## 2019-01-07 NOTE — Assessment & Plan Note (Signed)
Sleep hygiene reviewed and written information offered also. Prescription sent for  medication needed.  

## 2019-01-07 NOTE — Assessment & Plan Note (Signed)
Controlled, no change in medication DASH diet and commitment to daily physical activity for a minimum of 30 minutes discussed and encouraged, as a part of hypertension management. The importance of attaining a healthy weight is also discussed.  BP/Weight 01/07/2019 11/19/2018 08/27/2018 08/07/2018 05/29/2018 02/26/2018 03/25/311  Systolic BP 811 886 773 736 681 594 707  Diastolic BP 80 78 70 82 90 78 80  Wt. (Lbs) 149.08 152.04 150.4 147 151 - 144  BMI 24.43 24.92 24.67 23.73 24.37 - 23.24

## 2019-01-07 NOTE — Assessment & Plan Note (Signed)
Hyperlipidemia:Low fat diet discussed and encouraged.   Lipid Panel  Lab Results  Component Value Date   CHOL 190 05/29/2018   HDL 46 (L) 05/29/2018   LDLCALC 120 (H) 05/29/2018   TRIG 129 05/29/2018   CHOLHDL 4.1 05/29/2018    Uncontrolled Updated lab needed at/ before next visit.

## 2019-01-07 NOTE — Assessment & Plan Note (Signed)
Patient educated about the importance of limiting  Carbohydrate intake , the need to commit to daily physical activity for a minimum of 30 minutes , and to commit weight loss. The fact that changes in all these areas will reduce or eliminate all together the development of diabetes is stressed.   Diabetic Labs Latest Ref Rng & Units 05/29/2018 10/13/2017 04/10/2017 02/22/2016 09/16/2015  HbA1c <5.7 % of total Hgb 5.8(H) 5.9(H) 5.7(H) - -  Chol <200 mg/dL 190 263(H) 221(H) 191 230  HDL >50 mg/dL 46(L) 56 49(L) 50 -  Calc LDL mg/dL (calc) 120(H) 185(H) 152(H) 128 -  Triglycerides <150 mg/dL 129 98 99 67 -  Creatinine 0.50 - 1.05 mg/dL 0.80 0.81 0.76 0.79 -   BP/Weight 01/07/2019 11/19/2018 08/27/2018 08/07/2018 05/29/2018 02/26/2018 0/31/5945  Systolic BP 859 292 446 286 381 771 165  Diastolic BP 80 78 70 82 90 78 80  Wt. (Lbs) 149.08 152.04 150.4 147 151 - 144  BMI 24.43 24.92 24.67 23.73 24.37 - 23.24   No flowsheet data found.

## 2019-01-07 NOTE — Progress Notes (Signed)
Hannah Hays     MRN: [id number]      DOB: 04/01/1966   HPI [name] is here for follow up and re-evaluation of chronic medical conditions, medication management and review of any available recent lab and radiology data.  Preventive health is updated, specifically  Cancer screening and Immunization.   Questions or concerns regarding consultations or procedures which the PT has had in the interim are  addressed. The PT denies any adverse reactions to current medications since the last visit.  There are no new concerns.  There are no specific complaints   ROS Denies recent fever or chills. Denies sinus pressure, nasal congestion, ear pain or sore throat. Denies chest congestion, productive cough or wheezing. Denies chest pains, palpitations and leg swelling Denies abdominal pain, nausea, vomiting,diarrhea or constipation.   Denies dysuria, frequency, hesitancy or incontinence. Denies joint pain, swelling and limitation in mobility. Denies headaches, seizures, numbness, or tingling. Denies depression, anxiety or insomnia. Denies skin break down or rash.   PE  BP 110/80   Pulse 69   Resp 14   Ht 5' 5.5" (1.664 m)   Wt 149 lb 1.3 oz (67.6 kg)   SpO2 96% Comment: room air  BMI 24.43 kg/m    Patient alert and oriented and in no cardiopulmonary distress.  HEENT: No facial asymmetry, EOMI,   oropharynx pink and moist.  Neck supple no JVD, no mass.  Chest: Clear to auscultation bilaterally.  CVS: S1, S2 no murmurs, no S3.Regular rate.  ABD: Soft non tender.   Ext: No edema  MS: Adequate ROM spine, shoulders, hips and knees.  Skin: Intact, no ulcerations or rash noted.  Psych: Good eye contact, normal affect. Memory intact not anxious or depressed appearing.  CNS: CN 2-12 intact, power,  normal throughout.no focal deficits noted.   Assessment & Plan  HTN (hypertension) Controlled, no change in medication DASH diet and commitment to daily physical activity for  a minimum of 30 minutes discussed and encouraged, as a part of hypertension management. The importance of attaining a healthy weight is also discussed.  BP/Weight 01/07/2019 11/19/2018 08/27/2018 08/07/2018 05/29/2018 02/26/2018 03/01/9241  Systolic BP 683 419 622 297 989 211 941  Diastolic BP 80 78 70 82 90 78 80  Wt. (Lbs) 149.08 152.04 150.4 147 151 - 144  BMI 24.43 24.92 24.67 23.73 24.37 - 23.24       Moderate persistent asthma, uncomplicated Controlled, no change in medication   IGT (impaired glucose tolerance) Patient educated about the importance of limiting  Carbohydrate intake , the need to commit to daily physical activity for a minimum of 30 minutes , and to commit weight loss. The fact that changes in all these areas will reduce or eliminate all together the development of diabetes is stressed.   Diabetic Labs Latest Ref Rng & Units 05/29/2018 10/13/2017 04/10/2017 02/22/2016 09/16/2015  HbA1c <5.7 % of total Hgb 5.8(H) 5.9(H) 5.7(H) - -  Chol <200 mg/dL 190 263(H) 221(H) 191 230  HDL >50 mg/dL 46(L) 56 49(L) 50 -  Calc LDL mg/dL (calc) 120(H) 185(H) 152(H) 128 -  Triglycerides <150 mg/dL 129 98 99 67 -  Creatinine 0.50 - 1.05 mg/dL 0.80 0.81 0.76 0.79 -   BP/Weight 01/07/2019 11/19/2018 08/27/2018 08/07/2018 05/29/2018 02/26/2018 7/40/8144  Systolic BP 818 563 149 702 637 858 850  Diastolic BP 80 78 70 82 90 78 80  Wt. (Lbs) 149.08 152.04 150.4 147 151 - 144  BMI 24.43 24.92 24.67 23.73  24.37 - 23.24   No flowsheet data found.    Hyperlipidemia LDL goal <100 Hyperlipidemia:Low fat diet discussed and encouraged.   Lipid Panel  Lab Results  Component Value Date   CHOL 190 05/29/2018   HDL 46 (L) 05/29/2018   LDLCALC 120 (H) 05/29/2018   TRIG 129 05/29/2018   CHOLHDL 4.1 05/29/2018    Uncontrolled Updated lab needed at/ before next visit.   Insomnia Sleep hygiene reviewed and written information offered also. Prescription sent for  medication needed.   Need for  shingles vaccine After obtaining informed consent, the vaccine is  administered , with no adverse effect noted at the time of administration.

## 2019-01-08 DIAGNOSIS — I10 Essential (primary) hypertension: Secondary | ICD-10-CM | POA: Diagnosis not present

## 2019-01-08 DIAGNOSIS — R7302 Impaired glucose tolerance (oral): Secondary | ICD-10-CM | POA: Diagnosis not present

## 2019-01-08 DIAGNOSIS — E559 Vitamin D deficiency, unspecified: Secondary | ICD-10-CM | POA: Diagnosis not present

## 2019-01-08 DIAGNOSIS — E785 Hyperlipidemia, unspecified: Secondary | ICD-10-CM | POA: Diagnosis not present

## 2019-01-09 LAB — COMPLETE METABOLIC PANEL WITH GFR
AG Ratio: 1.7 (calc) (ref 1.0–2.5)
ALT: 12 U/L (ref 6–29)
AST: 16 U/L (ref 10–35)
Albumin: 4.5 g/dL (ref 3.6–5.1)
Alkaline phosphatase (APISO): 75 U/L (ref 37–153)
BUN: 12 mg/dL (ref 7–25)
CO2: 27 mmol/L (ref 20–32)
Calcium: 8.9 mg/dL (ref 8.6–10.4)
Chloride: 104 mmol/L (ref 98–110)
Creat: 0.82 mg/dL (ref 0.50–1.05)
GFR, Est African American: 95 mL/min/{1.73_m2} (ref >=60)
GFR, Est Non African American: 82 mL/min/{1.73_m2} (ref >=60)
Globulin: 2.6 g/dL (calc) (ref 1.9–3.7)
Glucose, Bld: 91 mg/dL (ref 65–99)
Potassium: 3.6 mmol/L (ref 3.5–5.3)
Sodium: 141 mmol/L (ref 135–146)
Total Bilirubin: 0.6 mg/dL (ref 0.2–1.2)
Total Protein: 7.1 g/dL (ref 6.1–8.1)

## 2019-01-09 LAB — TSH: TSH: 0.87 mIU/L

## 2019-01-09 LAB — LIPID PANEL
Cholesterol: 204 mg/dL — ABNORMAL HIGH (ref <200)
HDL: 54 mg/dL (ref >=50)
LDL Cholesterol (Calc): 128 mg/dL (calc) — ABNORMAL HIGH
NON-HDL CHOLESTEROL (CALC): 150 mg/dL — AB (ref <130)
Total CHOL/HDL Ratio: 3.8 (calc) (ref <5.0)
Triglycerides: 110 mg/dL (ref <150)

## 2019-01-09 LAB — CBC
HCT: 38.4 % (ref 35.0–45.0)
Hemoglobin: 12.8 g/dL (ref 11.7–15.5)
MCH: 28 pg (ref 27.0–33.0)
MCHC: 33.3 g/dL (ref 32.0–36.0)
MCV: 84 fL (ref 80.0–100.0)
MPV: 12.8 fL — ABNORMAL HIGH (ref 7.5–12.5)
Platelets: 141 10*3/uL (ref 140–400)
RBC: 4.57 10*6/uL (ref 3.80–5.10)
RDW: 14.5 % (ref 11.0–15.0)
WBC: 6.1 10*3/uL (ref 3.8–10.8)

## 2019-01-09 LAB — HEMOGLOBIN A1C
Hgb A1c MFr Bld: 6.1 % of total Hgb — ABNORMAL HIGH (ref <5.7)
Mean Plasma Glucose: 128 (calc)
eAG (mmol/L): 7.1 (calc)

## 2019-01-09 LAB — VITAMIN D 25 HYDROXY (VIT D DEFICIENCY, FRACTURES): Vit D, 25-Hydroxy: 19 ng/mL — ABNORMAL LOW (ref 30–100)

## 2019-01-15 ENCOUNTER — Other Ambulatory Visit: Payer: Self-pay | Admitting: Family Medicine

## 2019-02-26 ENCOUNTER — Ambulatory Visit: Payer: BLUE CROSS/BLUE SHIELD

## 2019-02-26 ENCOUNTER — Ambulatory Visit (INDEPENDENT_AMBULATORY_CARE_PROVIDER_SITE_OTHER): Payer: BLUE CROSS/BLUE SHIELD | Admitting: Allergy & Immunology

## 2019-02-26 ENCOUNTER — Encounter: Payer: Self-pay | Admitting: Allergy & Immunology

## 2019-02-26 ENCOUNTER — Other Ambulatory Visit: Payer: Self-pay

## 2019-02-26 VITALS — BP 142/78 | HR 71 | Temp 98.4°F | Resp 18

## 2019-02-26 DIAGNOSIS — J4540 Moderate persistent asthma, uncomplicated: Secondary | ICD-10-CM

## 2019-02-26 DIAGNOSIS — J3089 Other allergic rhinitis: Secondary | ICD-10-CM | POA: Diagnosis not present

## 2019-02-26 NOTE — Progress Notes (Signed)
FOLLOW UP  Date of Service/Encounter:  02/26/19   Assessment:   Moderate persistent asthma, uncomplicated  Perennial allergic rhinitis   Asthma Reportables: Severity:moderate persistent Risk:low Control:well controlled  Plan/Recommendations:   1. Moderate persistent asthma, uncomplicated - We did not do lung testing today. - We will not make any medication changes since you are doing so well.  - Daily controller medication(s): Symbicort 160/4.5 two puffs twice daily + Singulair 10mg  daily + Fasenra every 8 weeks - Rescue medications: ProAir 4 puffs every 4-6 hours as needed - Asthma control goals:  * Full participation in all desired activities (may need albuterol before activity) * Albuterol use two time or less a week on average (not counting use with activity) * Cough interfering with sleep two time or less a month * Oral steroids no more than once a year * No hospitalizations  2. Chronic allergic rhinitis  - Continue with Qnasl 79mcg two puffs once daily. - Continue with nasal saline rinses 1-2 times daily.  - Continue with Singulair 10mg  daily.  3. Return in about 6 months (around 08/29/2019).   Subjective:   Hannah Hays is a 53 y.o. female presenting today for follow up of  Chief Complaint  Patient presents with  . Follow-up    Hannah Hays has a history of the following: Patient Active Problem List   Diagnosis Date Noted  . Need for shingles vaccine 01/07/2019  . Insomnia 08/07/2018  . Hyperlipidemia LDL goal <100 10/16/2017  . Perennial allergic rhinitis 08/28/2017  . Moderate persistent asthma, uncomplicated 64/33/2951  . Hx of adenomatous colonic polyps   . Lipoma 12/26/2015  . Liver mass, left lobe 02/11/2015  . Nasal polyposis 08/20/2014  . Chronic nonallergic rhinitis 08/20/2014  . Vitamin D deficiency 07/12/2012  . HTN (hypertension) 08/19/2011  . GERD (gastroesophageal reflux disease) 04/11/2011  . IBS 12/19/2010  . IGT  (impaired glucose tolerance) 12/26/2009  . Asthma 11/15/2007    History obtained from: chart review and patient.  Hannah Hays is a 53 y.o. female presenting for a follow up visit.  She was last seen in September 2019.  At that time, her lung testing looked excellent.  We attempted to change her Symbicort to 1 puff twice daily to see how she did with this.  We continued Singulair as well as Fasenra.  For her allergic rhinitis, we continued with Qnasl 80 mcg 2 puffs daily as well as nasal saline rinses and Singulair.  Since last visit, she has done very well.  Asthma/Respiratory Symptom History: Unfortunately, after decreasing her Symbicort to 1 puff twice daily, her asthma symptoms returned.  She increased back to 2 puffs twice daily and has done well with this.  She remains on the Singulair.  Fasenra injections are going well.  She has had no adverse reactions to this.  She has required no prednisone burst or ER visits.  In fact, she does not even know where her rescue inhaler is and does not remember the last time that she used it.  ACT score is 25, indicating excellent asthma control.  Allergic Rhinitis Symptom History: She remains on the Qnasl as well as nasal saline rinses and Singulair.  She has not required any antibiotics at all.  Otherwise, there have been no changes to her past medical history, surgical history, family history, or social history.  Her work is going well.  She continues to work third shift, but occasionally has to take care of a grandson during the day.  She does not feel that she has been getting enough sleep, but otherwise she is doing fine.    Review of Systems  Constitutional: Negative.  Negative for fever, malaise/fatigue and weight loss.  HENT: Negative.  Negative for congestion, ear discharge and ear pain.   Eyes: Negative for pain, discharge and redness.  Respiratory: Negative for cough, sputum production, shortness of breath and wheezing.   Cardiovascular: Negative.   Negative for chest pain and palpitations.  Gastrointestinal: Negative for abdominal pain and heartburn.  Skin: Negative.  Negative for itching and rash.  Neurological: Negative for dizziness and headaches.  Endo/Heme/Allergies: Negative for environmental allergies. Does not bruise/bleed easily.       Objective:   Blood pressure (!) 142/78, pulse 71, temperature 98.4 F (36.9 C), temperature source Oral, resp. rate 18, SpO2 96 %. There is no height or weight on file to calculate BMI.   Physical Exam:  Physical Exam  Constitutional: She appears well-developed.  Pleasant female.  HENT:  Head: Normocephalic and atraumatic.  Right Ear: Tympanic membrane, external ear and ear canal normal.  Left Ear: Tympanic membrane, external ear and ear canal normal.  Nose: No mucosal edema, rhinorrhea, nasal deformity or septal deviation. No epistaxis. Right sinus exhibits no maxillary sinus tenderness and no frontal sinus tenderness. Left sinus exhibits no maxillary sinus tenderness and no frontal sinus tenderness.  Mouth/Throat: Uvula is midline and oropharynx is clear and moist. Mucous membranes are not pale and not dry.  Normal-appearing oropharynx.  No cobblestoning appreciated.  Eyes: Pupils are equal, round, and reactive to light. Conjunctivae and EOM are normal. Right eye exhibits no chemosis and no discharge. Left eye exhibits no chemosis and no discharge. Right conjunctiva is not injected. Left conjunctiva is not injected.  Cardiovascular: Normal rate, regular rhythm and normal heart sounds.  Respiratory: Effort normal and breath sounds normal. No accessory muscle usage. No tachypnea. No respiratory distress. She has no wheezes. She has no rhonchi. She has no rales. She exhibits no tenderness.  Moving air well in all lung fields.  No increased work of breathing.  Lymphadenopathy:    She has no cervical adenopathy.  Neurological: She is alert.  Skin: No abrasion, no petechiae and no rash  noted. Rash is not papular, not vesicular and not urticarial. No erythema. No pallor.  No eczema or urticarial lesions.  Psychiatric: She has a normal mood and affect.     Diagnostic studies: none      [name], MD  [facility]

## 2019-02-26 NOTE — Patient Instructions (Addendum)
1. Moderate persistent asthma, uncomplicated - We did not do lung testing today. - We will not make any medication changes since you are doing so well.  - Daily controller medication(s): Symbicort 160/4.5 two puffs twice daily + Singulair 10mg  daily + Fasenra every 8 weeks - Rescue medications: ProAir 4 puffs every 4-6 hours as needed - Asthma control goals:  * Full participation in all desired activities (may need albuterol before activity) * Albuterol use two time or less a week on average (not counting use with activity) * Cough interfering with sleep two time or less a month * Oral steroids no more than once a year * No hospitalizations  2. Chronic allergic rhinitis  - Continue with Qnasl 58mcg two puffs once daily. - Continue with nasal saline rinses 1-2 times daily.  - Continue with Singulair 10mg  daily.  3. Return in about 6 months (around 08/29/2019).   Please inform us of any Emergency Department visits, hospitalizations, or changes in symptoms. Call us before going to the ED for breathing or allergy symptoms since we might be able to fit you in for a sick visit. Feel free to contact us anytime with any questions, problems, or concerns.  It was a pleasure to see you again today!  Websites that have reliable patient information: 1. American Academy of Asthma, Allergy, and Immunology: www.aaaai.org 2. Food Allergy Research and Education (FARE): foodallergy.org 3. Mothers of Asthmatics: http://www.asthmacommunitynetwork.org 4. American College of Allergy, Asthma, and Immunology: www.acaai.org  "Like" [place] on Facebook and Instagram for our latest updates!      Make sure you are registered to vote! If you have moved or changed any of your contact information, you will need to get this updated before voting!    Voter ID laws are NOT going into effect for the General Election in November 2020! DO NOT let this stop you from exercising your right to vote!

## 2019-04-14 ENCOUNTER — Ambulatory Visit: Payer: BLUE CROSS/BLUE SHIELD

## 2019-04-23 ENCOUNTER — Ambulatory Visit (INDEPENDENT_AMBULATORY_CARE_PROVIDER_SITE_OTHER): Payer: BLUE CROSS/BLUE SHIELD

## 2019-04-23 ENCOUNTER — Other Ambulatory Visit: Payer: Self-pay

## 2019-04-23 DIAGNOSIS — J4550 Severe persistent asthma, uncomplicated: Secondary | ICD-10-CM

## 2019-05-01 ENCOUNTER — Ambulatory Visit (INDEPENDENT_AMBULATORY_CARE_PROVIDER_SITE_OTHER): Payer: BLUE CROSS/BLUE SHIELD | Admitting: Family Medicine

## 2019-05-01 ENCOUNTER — Other Ambulatory Visit: Payer: Self-pay

## 2019-05-01 ENCOUNTER — Encounter: Payer: Self-pay | Admitting: Family Medicine

## 2019-05-01 VITALS — BP 127/94 | Ht 65.5 in | Wt 149.0 lb

## 2019-05-01 DIAGNOSIS — E785 Hyperlipidemia, unspecified: Secondary | ICD-10-CM | POA: Diagnosis not present

## 2019-05-01 DIAGNOSIS — I10 Essential (primary) hypertension: Secondary | ICD-10-CM

## 2019-05-01 DIAGNOSIS — R7303 Prediabetes: Secondary | ICD-10-CM | POA: Diagnosis not present

## 2019-05-01 MED ORDER — ROSUVASTATIN CALCIUM 20 MG PO TABS: 20.0000 mg | ORAL_TABLET | Freq: Every day | ORAL | 1 refills | Status: DC

## 2019-05-01 NOTE — Progress Notes (Signed)
Virtual Visit via Telephone Note   This visit type was conducted due to national recommendations for restrictions regarding the COVID-19 Pandemic (e.g. social distancing) in an effort to limit this patient's exposure and mitigate transmission in our community.  Due to her co-morbid illnesses, this patient is at least at moderate risk for complications without adequate follow up.  This format is felt to be most appropriate for this patient at this time.  The patient did not have access to video technology/had technical difficulties with video requiring transitioning to audio format only (telephone).  All issues noted in this document were discussed and addressed.  No physical exam could be performed with this format.    Evaluation Performed:  Follow-up visit  Date:  05/01/2019   ID:  Hannah Hays, DOB May 07, 1966, MRN [id number]  Patient Location: Home Provider Location: Other:  telemedicine  Location of Patient: Home Location of Provider: Telehealth Consent was obtain for visit to be over via telehealth. I verified that I am speaking with the correct person using two identifiers.  PCP:  [name], MD   Chief Complaint:  Follow on chronic conditions  History of Present Illness:    Hannah Hays is a 53 y.o. female with pertinent history of hypertension, asthma, GERD, impaired glucose tolerance, prediabetic, elevated cholesterol, vitamin D deficiency among others.  Ms. [name] is a patient of Dr. Griffin Dakin and is well-known to the clinic.  She denies having any difficulties with any of her medications and is taking them as directed.  Reports that she needs a refill on her Crestor today.  In for follow-up pertaining predominantly to hypertension, hyperlipidemia, prediabetic range on her last A1c.  Hypertension reports she is taking her medication as directed.  Is maintained on Norvasc at this time and is tolerating it well.  Denies having any leg swelling, chest pain,  palpitations, vision changes, headaches or dizziness.  Hyper lipidemia reports taking her Crestor as directed.  Reports that she does not always eat the best food choices.  Her weight has not changed in the last couple of months.  She still weighing around 149.  Prediabetic last A1c was 6.1 in February 2020, A1c before that in June 2019 was 5.8.  She was advised to make sure she maintains a heart healthy, diabetic, low carbohydrate, low-fat diet.  To help with maintenance of this A1c. She would like to try to implement some dietary lifestyle changes prior to having it checked again.  Denies having any polydipsia, polyuria, polyphagia.   The patient does not have symptoms concerning for COVID-19 infection (fever, chills, cough, or new shortness of breath).   Past Medical, Surgical, Social History, Allergies, and Medications have been Reviewed.  Past Medical History:  Diagnosis Date  . Allergic rhinitis, seasonal   . Anxiety   . Asthma   . Depression   . GERD (gastroesophageal reflux disease)   . Helicobacter pylori gastritis 2009   s/p Prevpac treatment  . History of colonoscopy 2008   with simple adenoma  . Hypertension   . IBS (irritable bowel syndrome)    pre-dominant diarrhea  . Migraine headache 17   recurrence 1st time in 10 year in 2011   Past Surgical History:  Procedure Laterality Date  . ABDOMINAL HYSTERECTOMY    . CHOLECYSTECTOMY    . COLONOSCOPY  11/13/2007   SLF:A 4 mm sessile transverse colon polyp/Random biopsies obtained throughout the colon/Otherwise normal. Negative for microscopic colitis, simple adenoma, due for  surveillance 11/2017 per [facility]  . COLONOSCOPY N/A 02/02/2016   Procedure: COLONOSCOPY;  Surgeon: Danie Binder, MD;  [facility]: [facility];  Service: Endoscopy;  Laterality: N/A;  12:15 PM  . ESOPHAGOGASTRODUODENOSCOPY  03/20/2008   SLF:A 1- to 2-cm hiatal hernia, mild erythema in the antrum/Normal esophagus without evidence of Barrett's, +H.pylori s/p  Prevapc treatment   . ESOPHAGOGASTRODUODENOSCOPY (EGD) WITH ESOPHAGEAL DILATION N/A 02/14/2013   GLO:VFIEPP Esophageal web/MILD Non-erosive gastritis (inflammation)  . PARTIAL HYSTERECTOMY  2002  . SEPTOPLASTY N/A 08/16/2015   Procedure: SEPTOPLASTY;  Surgeon: Leta Baptist, MD;  Location: [facility];  Service: ENT;  Laterality: N/A;  . SINUS ENDO WITH FUSION Bilateral 08/16/2015   Procedure: ETHMOIDECTOMY, SPHENOIDECTOMY, MAXILLARY ANTROSTOMY, FRONTAL RECESS EXPLORATION WITH FUSION NAVIGATION;  Surgeon: Leta Baptist, MD;  Location: [facility];  Service: ENT;  Laterality: Bilateral;  . TUBAL LIGATION  1995     Current Meds  Medication Sig  . albuterol (PROAIR HFA) 108 (90 Base) MCG/ACT inhaler Inhale 2 puffs into the lungs every 4 (four) hours as needed for wheezing or shortness of breath.  Marland Kitchen amLODipine (NORVASC) 10 MG tablet Take 1 tablet (10 mg total) by mouth daily.  . Beclomethasone Dipropionate (QNASL) 80 MCG/ACT AERS Place 2 sprays into both nostrils 2 (two) times daily.  . budesonide-formoterol (SYMBICORT) 160-4.5 MCG/ACT inhaler Inhale 2 puffs into the lungs 2 (two) times daily.  . Calcium Carbonate-Vitamin D (CALCIUM 600 + D PO) Take 2 tablets by mouth at bedtime.   Marland Kitchen EPINEPHrine (AUVI-Q) 0.3 mg/0.3 mL IJ SOAJ injection Inject 0.3 mg once into the muscle.  . estradiol (VIVELLE-DOT) 0.025 MG/24HR Place 1 patch onto the skin once a week.  Marland Kitchen FASENRA 30 MG/ML SOSY MAINTENANCE: INJECT ONE SYRINGE SUBCUTANEOUSLY EVERY 8 WEEKS  . fluticasone (FLONASE) 50 MCG/ACT nasal spray Place 2 sprays into both nostrils daily.  . mirtazapine (REMERON) 15 MG tablet TAKE 1/2 TABLET BY MOUTH AT BEDTIME.  . montelukast (SINGULAIR) 10 MG tablet Take 1 tablet (10 mg total) by mouth at bedtime.  . Multiple Vitamin (MULTIVITAMIN WITH MINERALS) TABS Take 1 tablet by mouth at bedtime.   . rosuvastatin (CRESTOR) 20 MG tablet Take 1 tablet (20 mg total) by mouth daily.  . [DISCONTINUED]  rosuvastatin (CRESTOR) 20 MG tablet Take 1 tablet (20 mg total) by mouth daily.   Current Facility-Administered Medications for the 05/01/19 encounter (Office Visit) with Perlie Mayo, NP  Medication  . Benralizumab SOSY 30 mg     Allergies:   Bacid   Social History   Tobacco Use  . Smoking status: Never Smoker  . Smokeless tobacco: Never Used  Substance Use Topics  . Alcohol use: No  . Drug use: No     Family Hx: The patient's family history includes Alcohol abuse in her father; Diabetes in her brother, mother, sister, and sister; Heart disease in her brother and mother; Hypertension in her brother, father, mother, sister, and sister; Kidney disease in her sister; Stroke in her father. There is no history of Colon cancer.  ROS:   Please see the history of present illness.    Review of Systems  Constitutional: Negative for activity change, appetite change, chills and fever.  HENT: Negative.   Eyes: Negative.   Respiratory: Negative.  Negative for cough and shortness of breath.   Cardiovascular: Negative for chest pain and leg swelling.  Gastrointestinal: Negative.   Endocrine: Negative.  Negative for polydipsia, polyphagia and polyuria.  Genitourinary: Negative.  Musculoskeletal: Negative.   Skin: Negative.   Allergic/Immunologic: Negative.   Neurological: Negative.  Negative for dizziness and headaches.  Hematological: Negative.   Psychiatric/Behavioral: Negative.   All other systems reviewed and are negative.   Labs/Other Tests and Data Reviewed:    Recent Labs: 01/08/2019: ALT 12; BUN 12; Creat 0.82; Hemoglobin 12.8; Platelets 141; Potassium 3.6; Sodium 141; TSH 0.87   Recent Lipid Panel Lab Results  Component Value Date/Time   CHOL 204 (H) 01/08/2019 11:13 AM   CHOL 230 09/16/2015   TRIG 110 01/08/2019 11:13 AM   HDL 54 01/08/2019 11:13 AM   CHOLHDL 3.8 01/08/2019 11:13 AM   LDLCALC 128 (H) 01/08/2019 11:13 AM    Wt Readings from Last 3 Encounters:   05/01/19 149 lb (67.6 kg)  01/07/19 149 lb 1.3 oz (67.6 kg)  11/19/18 152 lb 0.6 oz (69 kg)     Objective:    Vital Signs:  BP (!) 127/94 Comment: pt reported  Ht 5' 5.5" (1.664 m)   Wt 149 lb (67.6 kg)   BMI 24.42 kg/m    VITAL SIGNS:  reviewed GEN:  Alert and oriented RESPIRATORY:  No noted shortness of breath during conversation PSYCH:  Normal affect, mood, good communication during conversation  ASSESSMENT & PLAN:    1. Hyperlipidemia LDL goal <100 Uncontrolled, last lipid profile demonstrated elevation in cholesterol, LDL even though she is maintained on Crestor.  She is not having any difficulties or trouble with Crestor at this time.  Will assess her labs at her next visit.  Advised to maintain a low-cholesterol, heart healthy, low carbohydrate diet.  Encouraged to try to get 30 minutes of exercise on at least 5 days of the week as well.  - rosuvastatin (CRESTOR) 20 MG tablet; Take 1 tablet (20 mg total) by mouth daily.  Dispense: 90 tablet; Refill: 1 - Lipid panel; Standing  2. Essential hypertension Uncontrolled, per her report she is 127/94 on her last blood pressure check at home.  Previous office notes demonstrate better control 110/80s.  Advised to continue taking medication as prescribed.  Will assess this at next visit.  - CBC with Differential/Platelet; Standing - COMPLETE METABOLIC PANEL WITH GFR; Standing  3. Prediabetes Deteriorating, previous A1c had increased to 6.1 from 5.8.  Advised to maintain a low carbohydrate, heart healthy, low-fat diet.  Educated on the proper foods that she should eat.  Along with addition of getting exercise in 30 days on most days of the week.  - Hemoglobin A1c; Standing   Time:   Today, I have spent 10 minutes with the patient with telehealth technology discussing the above problems.     Medication Adjustments/Labs and Tests Ordered: Current medicines are reviewed at length with the patient today.  Concerns regarding  medicines are outlined above.   Tests Ordered: Orders Placed This Encounter  Procedures  . Lipid panel  . CBC with Differential/Platelet  . COMPLETE METABOLIC PANEL WITH GFR  . Hemoglobin A1c    Medication Changes: Meds ordered this encounter  Medications  . rosuvastatin (CRESTOR) 20 MG tablet    Sig: Take 1 tablet (20 mg total) by mouth daily.    Dispense:  90 tablet    Refill:  1    Order Specific Question:   Supervising Provider    Answer:   Tula Nakayama E [[id number]]    Disposition:  Follow up in 5 month(s)  Signed, Perlie Mayo, NP  05/01/2019 11:12 AM     [facility]  [facility]

## 2019-05-01 NOTE — Patient Instructions (Addendum)
Thank you for completing your visit via telephone. I appreciate the opportunity to provide you with the care for your health and wellness. Today we discussed: Overall health and wellness  I will be placing some labs for you to get prior to your next visit.  You can get them 1 week prior to your appointment.  This will include your A1c which is a measurement of the prediabetic range that you are currently in.  Additionally we will be getting your kidney and liver function.  And your cholesterol.  Please make sure that you are fasting during these labs.  Please look to try to start eating a diet that is low in fats, carbohydrates, his heart healthy.  Making sure that you eat the rainbow with the color of vegetables and fruits that you consume.  Making sure that you avoid canned and or boxed processed foods.  Making sure that you maintain avoidance of fried, fatty, sugary foods.  Please continue to take your medications as directed.  Your blood pressure he reported was a little bit on the higher in with the 94 your bottom number.  This is concerning and we would like to make sure that you are not taking in too much sodium that helps to hold onto extra fluid to cause you to have elevated BP.  Work to see if he can get 30 minutes of exercise and on most days of the week.  Aim for 5 days of the week so that he can get a total of 150 minutes/week.  You can do this with 30-minute walking intervals.  If you need to break up the 30-minute interval you can do it in 10-minute segments for 15-minute segments to help fitted into your day.  We will be thinking of you and your father.  We hope that he feels better soon.  Please continue to practice social distancing during this time.  [place] YOUR HANDS WELL AND FREQUENTLY. AVOID TOUCHING YOUR FACE, UNLESS YOUR HANDS ARE FRESHLY WASHED.  GET FRESH AIR DAILY. STAY HYDRATED WITH WATER.   It was a pleasure to see you and I look forward to continuing to work  together on your health and well-being. Please do not hesitate to call the office if you need care or have questions about your care.  Have a wonderful day and week. With Gratitude, Cherly Beach, DNP, AGNP-BC   Prediabetes Eating Plan Prediabetes is a condition that causes blood sugar (glucose) levels to be higher than normal. This increases the risk for developing diabetes. In order to prevent diabetes from developing, your health care provider may recommend a diet and other lifestyle changes to help you:  Control your blood glucose levels.  Improve your cholesterol levels.  Manage your blood pressure. Your health care provider may recommend working with a diet and nutrition specialist (dietitian) to make a meal plan that is best for you. What are tips for following this plan? Lifestyle  Set weight loss goals with the help of your health care team. It is recommended that most people with prediabetes lose 7% of their current body weight.  Exercise for at least 30 minutes at least 5 days a week.  Attend a support group or seek ongoing support from a mental health counselor.  Take over-the-counter and prescription medicines only as told by your health care provider. Reading food labels  Read food labels to check the amount of fat, salt (sodium), and sugar in prepackaged foods. Avoid foods that have: ?  Saturated fats. ? Trans fats. ? Added sugars.  Avoid foods that have more than 300 milligrams (mg) of sodium per serving. Limit your daily sodium intake to less than 2,300 mg each day. Shopping  Avoid buying pre-made and processed foods. Cooking  Cook with olive oil. Do not use butter, lard, or ghee.  Bake, broil, grill, or boil foods. Avoid frying. Meal planning   Work with your dietitian to develop an eating plan that is right for you. This may include: ? Tracking how many calories you take in. Use a food diary, notebook, or mobile application to track what you eat at each  meal. ? Using the glycemic index (GI) to plan your meals. The index tells you how quickly a food will raise your blood glucose. Choose low-GI foods. These foods take a longer time to raise blood glucose.  Consider following a Mediterranean diet. This diet includes: ? Several servings each day of fresh fruits and vegetables. ? Eating fish at least twice a week. ? Several servings each day of whole grains, beans, nuts, and seeds. ? Using olive oil instead of other fats. ? Moderate alcohol consumption. ? Eating small amounts of red meat and whole-fat dairy.  If you have high blood pressure, you may need to limit your sodium intake or follow a diet such as the DASH eating plan. DASH is an eating plan that aims to lower high blood pressure. What foods are recommended? The items listed below may not be a complete list. Talk with your dietitian about what dietary choices are best for you. Grains Whole grains, such as whole-wheat or whole-grain breads, crackers, cereals, and pasta. Unsweetened oatmeal. Bulgur. Barley. Quinoa. Brown rice. Corn or whole-wheat flour tortillas or taco shells. Vegetables Lettuce. Spinach. Peas. Beets. Cauliflower. Cabbage. Broccoli. Carrots. Tomatoes. Squash. Eggplant. Herbs. Peppers. Onions. Cucumbers. Brussels sprouts. Fruits Berries. Bananas. Apples. Oranges. Grapes. Papaya. Mango. Pomegranate. Kiwi. Grapefruit. Cherries. Meats and other protein foods Seafood. Poultry without skin. Lean cuts of pork and beef. Tofu. Eggs. Nuts. Beans. Dairy Low-fat or fat-free dairy products, such as yogurt, cottage cheese, and cheese. Beverages Water. Tea. Coffee. Sugar-free or diet soda. Seltzer water. Lowfat or no-fat milk. Milk alternatives, such as soy or almond milk. Fats and oils Olive oil. Canola oil. Sunflower oil. Grapeseed oil. Avocado. Walnuts. Sweets and desserts Sugar-free or low-fat pudding. Sugar-free or low-fat ice cream and other frozen treats. Seasoning and  other foods Herbs. Sodium-free spices. Mustard. Relish. Low-fat, low-sugar ketchup. Low-fat, low-sugar barbecue sauce. Low-fat or fat-free mayonnaise. What foods are not recommended? The items listed below may not be a complete list. Talk with your dietitian about what dietary choices are best for you. Grains Refined white flour and flour products, such as bread, pasta, snack foods, and cereals. Vegetables Canned vegetables. Frozen vegetables with butter or cream sauce. Fruits Fruits canned with syrup. Meats and other protein foods Fatty cuts of meat. Poultry with skin. Breaded or fried meat. Processed meats. Dairy Full-fat yogurt, cheese, or milk. Beverages Sweetened drinks, such as sweet iced tea and soda. Fats and oils Butter. Lard. Ghee. Sweets and desserts Baked goods, such as cake, cupcakes, pastries, cookies, and cheesecake. Seasoning and other foods Spice mixes with added salt. Ketchup. Barbecue sauce. Mayonnaise. Summary  To prevent diabetes from developing, you may need to make diet and other lifestyle changes to help control blood sugar, improve cholesterol levels, and manage your blood pressure.  Set weight loss goals with the help of your health care team. It is  recommended that most people with prediabetes lose 7 percent of their current body weight.  Consider following a Mediterranean diet that includes plenty of fresh fruits and vegetables, whole grains, beans, nuts, seeds, fish, lean meat, low-fat dairy, and healthy oils. This information is not intended to replace advice given to you by your health care provider. Make sure you discuss any questions you have with your health care provider. Document Released: 04/06/2015 Document Revised: 01/24/2017 Document Reviewed: 01/24/2017 Elsevier Interactive Patient Education  2019 [employer].

## 2019-05-12 ENCOUNTER — Ambulatory Visit (INDEPENDENT_AMBULATORY_CARE_PROVIDER_SITE_OTHER): Payer: BLUE CROSS/BLUE SHIELD

## 2019-05-12 ENCOUNTER — Ambulatory Visit: Payer: BLUE CROSS/BLUE SHIELD | Admitting: Family Medicine

## 2019-05-12 ENCOUNTER — Other Ambulatory Visit: Payer: Self-pay

## 2019-05-12 DIAGNOSIS — Z23 Encounter for immunization: Secondary | ICD-10-CM | POA: Diagnosis not present

## 2019-05-12 NOTE — Progress Notes (Signed)
shingrix #2 received with no concerns

## 2019-05-22 ENCOUNTER — Other Ambulatory Visit (HOSPITAL_COMMUNITY): Payer: Self-pay | Admitting: Family Medicine

## 2019-05-22 DIAGNOSIS — Z1231 Encounter for screening mammogram for malignant neoplasm of breast: Secondary | ICD-10-CM

## 2019-05-26 ENCOUNTER — Other Ambulatory Visit: Payer: Self-pay

## 2019-05-26 ENCOUNTER — Other Ambulatory Visit: Payer: BC Managed Care – PPO

## 2019-05-26 DIAGNOSIS — Z20822 Contact with and (suspected) exposure to covid-19: Secondary | ICD-10-CM

## 2019-05-29 ENCOUNTER — Other Ambulatory Visit: Payer: Self-pay

## 2019-05-29 ENCOUNTER — Ambulatory Visit (HOSPITAL_COMMUNITY)
Admission: RE | Admit: 2019-05-29 | Discharge: 2019-05-29 | Disposition: A | Discharge status: Home / Self Care | Payer: BC Managed Care – PPO | Source: Ambulatory Visit | Attending: Family Medicine | Admitting: Family Medicine

## 2019-05-29 ENCOUNTER — Encounter (HOSPITAL_COMMUNITY): Payer: Self-pay

## 2019-05-29 DIAGNOSIS — Z1231 Encounter for screening mammogram for malignant neoplasm of breast: Secondary | ICD-10-CM | POA: Diagnosis not present

## 2019-05-29 LAB — NOVEL CORONAVIRUS, NAA: SARS-CoV-2, NAA: NOT DETECTED

## 2019-06-02 DIAGNOSIS — Z79890 Hormone replacement therapy (postmenopausal): Secondary | ICD-10-CM | POA: Diagnosis not present

## 2019-06-02 DIAGNOSIS — Z130 Encounter for screening for diseases of the blood and blood-forming organs and certain disorders involving the immune mechanism: Secondary | ICD-10-CM | POA: Diagnosis not present

## 2019-06-02 DIAGNOSIS — Z01419 Encounter for gynecological examination (general) (routine) without abnormal findings: Secondary | ICD-10-CM | POA: Diagnosis not present

## 2019-06-02 DIAGNOSIS — Z1389 Encounter for screening for other disorder: Secondary | ICD-10-CM | POA: Diagnosis not present

## 2019-06-18 ENCOUNTER — Ambulatory Visit: Payer: Self-pay | Admitting: *Deleted

## 2019-06-18 ENCOUNTER — Ambulatory Visit: Payer: BLUE CROSS/BLUE SHIELD

## 2019-06-18 ENCOUNTER — Other Ambulatory Visit: Payer: Self-pay

## 2019-06-18 ENCOUNTER — Ambulatory Visit (INDEPENDENT_AMBULATORY_CARE_PROVIDER_SITE_OTHER): Payer: BC Managed Care – PPO | Admitting: *Deleted

## 2019-06-18 DIAGNOSIS — J4550 Severe persistent asthma, uncomplicated: Secondary | ICD-10-CM | POA: Diagnosis not present

## 2019-07-08 ENCOUNTER — Ambulatory Visit: Payer: BLUE CROSS/BLUE SHIELD | Admitting: Family Medicine

## 2019-08-01 ENCOUNTER — Other Ambulatory Visit: Payer: Self-pay | Admitting: Family Medicine

## 2019-08-01 ENCOUNTER — Other Ambulatory Visit: Payer: Self-pay | Admitting: Allergy & Immunology

## 2019-08-01 DIAGNOSIS — I10 Essential (primary) hypertension: Secondary | ICD-10-CM

## 2019-08-13 ENCOUNTER — Other Ambulatory Visit: Payer: Self-pay

## 2019-08-13 ENCOUNTER — Ambulatory Visit (INDEPENDENT_AMBULATORY_CARE_PROVIDER_SITE_OTHER): Payer: BLUE CROSS/BLUE SHIELD

## 2019-08-13 ENCOUNTER — Ambulatory Visit: Payer: Self-pay

## 2019-08-13 DIAGNOSIS — J4550 Severe persistent asthma, uncomplicated: Secondary | ICD-10-CM | POA: Diagnosis not present

## 2019-08-29 ENCOUNTER — Encounter: Payer: Self-pay | Admitting: Allergy & Immunology

## 2019-08-29 ENCOUNTER — Ambulatory Visit (INDEPENDENT_AMBULATORY_CARE_PROVIDER_SITE_OTHER): Payer: BC Managed Care – PPO | Admitting: Allergy & Immunology

## 2019-08-29 ENCOUNTER — Other Ambulatory Visit: Payer: Self-pay

## 2019-08-29 VITALS — BP 150/72 | HR 80 | Temp 98.7°F

## 2019-08-29 DIAGNOSIS — J3089 Other allergic rhinitis: Secondary | ICD-10-CM | POA: Diagnosis not present

## 2019-08-29 DIAGNOSIS — J4550 Severe persistent asthma, uncomplicated: Secondary | ICD-10-CM | POA: Diagnosis not present

## 2019-08-29 DIAGNOSIS — F4321 Adjustment disorder with depressed mood: Secondary | ICD-10-CM

## 2019-08-29 MED ORDER — SPIRIVA RESPIMAT 1.25 MCG/ACT IN AERS: 2.0000 | INHALATION_SPRAY | Freq: Every day | RESPIRATORY_TRACT | 5 refills | Status: DC

## 2019-08-29 NOTE — Patient Instructions (Addendum)
1. Moderate persistent asthma, uncomplicated - Lung testing looked stable today. - We are going to add on Spiriva two puffs once daily (samples provided) to see if this helps with your afternoon symptoms. - Call us next week with an update.  - Daily controller medication(s): Symbicort 160/4.5 two puffs twice daily + Spiriva  1.36mcg two puffs once daily + Singulair 10mg  daily + Fasenra every 8 weeks - Rescue medications: ProAir 4 puffs every 4-6 hours as needed - Asthma control goals:  * Full participation in all desired activities (may need albuterol before activity) * Albuterol use two time or less a week on average (not counting use with activity) * Cough interfering with sleep two time or less a month * Oral steroids no more than once a year * No hospitalizations  2. Chronic allergic rhinitis  - Continue with Qnasl 76mcg two puffs once daily. - Continue with nasal saline rinses 1-2 times daily.  - Continue with Singulair 10mg  daily.  3. Return in about 3 months (around 11/28/2019). This can be an in-person, a virtual Webex or a telephone follow up visit.   Please inform us of any Emergency Department visits, hospitalizations, or changes in symptoms. Call us before going to the ED for breathing or allergy symptoms since we might be able to fit you in for a sick visit. Feel free to contact us anytime with any questions, problems, or concerns.  It was a pleasure to see you again today! Hang in there - these are trying times.   Websites that have reliable patient information: 1. American Academy of Asthma, Allergy, and Immunology: www.aaaai.org 2. Food Allergy Research and Education (FARE): foodallergy.org 3. Mothers of Asthmatics: http://www.asthmacommunitynetwork.org 4. American College of Allergy, Asthma, and Immunology: www.acaai.org  "Like" [place] on Facebook and Instagram for our latest updates!      Make sure you are registered to vote! If you have moved or changed any of your  contact information, you will need to get this updated before voting!  In some cases, you MAY be able to register to vote online: [email or web address]    Voter ID laws are NOT going into effect for the General Election in November 2020! DO NOT let this stop you from exercising your right to vote!   Absentee voting is the SAFEST way to vote during the coronavirus pandemic!   Download and print an absentee ballot request form at [email or web address] or you can scan the QR code below with your smart phone:      More information on absentee ballots can be found here: [email or web address]

## 2019-08-29 NOTE — Progress Notes (Signed)
FOLLOW UP  Date of Service/Encounter:  08/29/19   Assessment:   Moderate persistent asthma, uncomplicated  Perennial allergic rhinitis  Grief reaction    Asthma Reportables: Severity:moderate persistent Risk:low Control:well controlled  Plan/Recommendations:   1. Moderate persistent asthma, uncomplicated - Lung testing looked stable today. - We are going to add on Spiriva two puffs once daily (samples provided) to see if this helps with your afternoon symptoms. - Call us next week with an update.  - Daily controller medication(s): Symbicort 160/4.5 two puffs twice daily + Spiriva  1.15mcg two puffs once daily + Singulair 10mg  daily + Fasenra every 8 weeks - Rescue medications: ProAir 4 puffs every 4-6 hours as needed - Asthma control goals:  * Full participation in all desired activities (may need albuterol before activity) * Albuterol use two time or less a week on average (not counting use with activity) * Cough interfering with sleep two time or less a month * Oral steroids no more than once a year * No hospitalizations  2. Chronic allergic rhinitis  - Continue with Qnasl 1mcg two puffs once daily. - Continue with nasal saline rinses 1-2 times daily.  - Continue with Singulair 10mg  daily.  3. Return in about 3 months (around 11/28/2019). This can be an in-person, a virtual Webex or a telephone follow up visit.   Subjective:   Hannah Hays is a 53 y.o. female presenting today for follow up of No chief complaint on file.   Hannah Hays has a history of the following: Patient Active Problem List   Diagnosis Date Noted  . Need for shingles vaccine 01/07/2019  . Insomnia 08/07/2018  . Hyperlipidemia LDL goal <100 10/16/2017  . Perennial allergic rhinitis 08/28/2017  . Moderate persistent asthma, uncomplicated [id number]  . Hx of adenomatous colonic polyps   . Lipoma 12/26/2015  . Liver mass, left lobe 02/11/2015  . Nasal polyposis 08/20/2014   . Chronic nonallergic rhinitis 08/20/2014  . Vitamin D deficiency 07/12/2012  . HTN (hypertension) 08/19/2011  . GERD (gastroesophageal reflux disease) 04/11/2011  . IBS 12/19/2010  . IGT (impaired glucose tolerance) 12/26/2009  . Asthma 11/15/2007    History obtained from: chart review and patient.  Hannah Hays is a 53 y.o. female presenting for follow up of her asthma and perennial allergic rhinitis. She was last seen in March 2020. At that time, we did not do lung testing. We continued with Symbicort 80/4.5 two puffs BID as well as Singulair and Fasenra. She was doing very well on this regimen. For her chronic allergic rhinitis, we continued with Qnasl as well nasal saline and Singulair.   Since the last visit, she had been doing well until about 1 month ago when her asthma control worsened. She does think that a lot of her issues have been secondary to stress in her life. She has buried both her father in 2019/07/23. Her son-in-law passed away this month as well at the age of 43.   Asthma/Respiratory Symptom History: Her asthma had been well controlled on Symbicort, Singulair, Fasenra until about 1 month ago when she started noticing afternoon an evening chest tightness and wheezing. These symptoms are relieved by her evening Symbicort, but she has felt like she really "needs" the evening dose. She has only needed to use her albuterol 2 times since her last visit. She also noted she has SOB and wheezing if she walks >~100 feet unless she is walking quite slow. These symptoms have been stable for the  past month and she has not required steroids nor visits to UC or the ED.  Allergic Rhinitis Symptom History: Her perennial allergy symptoms remain well controlled on Qnasl, Singulair, and nasal rinses. She has not needed any antibiotics since her last visit.  Otherwise, there have been no changes to her past medical history, surgical history, family history, or social history.    Review of Systems   Constitutional: Negative.  Negative for chills, fever, malaise/fatigue and weight loss.  HENT: Negative.  Negative for congestion, ear discharge, ear pain, sore throat and tinnitus.   Eyes: Negative for pain, discharge and redness.  Respiratory: Negative for cough, sputum production, shortness of breath and wheezing.   Cardiovascular: Negative.  Negative for chest pain and palpitations.  Gastrointestinal: Negative for abdominal pain, constipation, diarrhea, heartburn, nausea and vomiting.  Skin: Negative.  Negative for itching and rash.  Neurological: Negative for dizziness and headaches.  Endo/Heme/Allergies: Negative for environmental allergies. Does not bruise/bleed easily.       Objective:   Blood pressure (!) 150/72, pulse 80, temperature 98.7 F (37.1 C), temperature source Temporal, SpO2 97 %. There is no height or weight on file to calculate BMI.   Physical Exam:  Physical Exam  Constitutional: She appears well-developed.  Pleasant female as always. Very appreciative overall.   HENT:  Head: Normocephalic and atraumatic.  Right Ear: Tympanic membrane, external ear and ear canal normal.  Left Ear: Tympanic membrane, external ear and ear canal normal.  Nose: Mucosal edema and rhinorrhea present. No nasal deformity or septal deviation. No epistaxis. Right sinus exhibits no maxillary sinus tenderness and no frontal sinus tenderness. Left sinus exhibits no maxillary sinus tenderness and no frontal sinus tenderness.  Mouth/Throat: Uvula is midline and oropharynx is clear and moist. Mucous membranes are not pale and not dry.  She does have some erythema in the posterior oropharynx, but there is no cobblestoning appreciated.   Eyes: Pupils are equal, round, and reactive to light. Conjunctivae and EOM are normal. Right eye exhibits no chemosis and no discharge. Left eye exhibits no chemosis and no discharge. Right conjunctiva is not injected. Left conjunctiva is not injected.   Cardiovascular: Normal rate, regular rhythm and normal heart sounds.  Respiratory: Effort normal and breath sounds normal. No accessory muscle usage. No tachypnea. No respiratory distress. She has no wheezes. She has no rhonchi. She has no rales. She exhibits no tenderness.  Lymphadenopathy:    She has no cervical adenopathy.  Neurological: She is alert.  Skin: No abrasion, no petechiae and no rash noted. Rash is not papular, not vesicular and not urticarial. No erythema. No pallor.  No eczematous or urticarial lesions noted.   Psychiatric: She has a normal mood and affect.     Diagnostic studies:    Spirometry: results normal (FEV1: 1.65/74%, FVC: 2.39/81%, FEV1/FVC: 69%).    Spirometry consistent with normal pattern.    Allergy Studies: none       [name], MD  [facility]

## 2019-10-01 ENCOUNTER — Encounter: Payer: Self-pay | Admitting: Family Medicine

## 2019-10-01 ENCOUNTER — Other Ambulatory Visit: Payer: Self-pay

## 2019-10-01 ENCOUNTER — Ambulatory Visit (INDEPENDENT_AMBULATORY_CARE_PROVIDER_SITE_OTHER): Payer: BC Managed Care – PPO | Admitting: Family Medicine

## 2019-10-01 VITALS — BP 128/84 | HR 72 | Temp 97.5°F | Resp 15 | Ht 65.0 in | Wt 155.0 lb

## 2019-10-01 DIAGNOSIS — R7302 Impaired glucose tolerance (oral): Secondary | ICD-10-CM | POA: Diagnosis not present

## 2019-10-01 DIAGNOSIS — J4540 Moderate persistent asthma, uncomplicated: Secondary | ICD-10-CM

## 2019-10-01 DIAGNOSIS — J3089 Other allergic rhinitis: Secondary | ICD-10-CM

## 2019-10-01 DIAGNOSIS — E785 Hyperlipidemia, unspecified: Secondary | ICD-10-CM | POA: Diagnosis not present

## 2019-10-01 DIAGNOSIS — Z23 Encounter for immunization: Secondary | ICD-10-CM | POA: Diagnosis not present

## 2019-10-01 DIAGNOSIS — I10 Essential (primary) hypertension: Secondary | ICD-10-CM | POA: Diagnosis not present

## 2019-10-01 NOTE — Progress Notes (Signed)
Hannah Hays     MRN: [id number]      DOB: Dec 14, 1965   HPI Hannah Hays is here for follow up and re-evaluation of chronic medical conditions, medication management and review of any available recent lab and radiology data.  Preventive health is updated, specifically  Cancer screening and Immunization.   Questions or concerns regarding consultations or procedures which the PT has had in the interim are  addressed. The PT denies any adverse reactions to current medications since the last visit.  Has lost both her father , and her son in law, in the past several months, trying to deal with this as best able  ROS Denies recent fever or chills. Denies sinus pressure, nasal congestion, ear pain or sore throat. Denies chest congestion, productive cough or wheezing. Denies chest pains, palpitations and leg swelling Denies abdominal pain, nausea, vomiting,diarrhea or constipation.   Denies dysuria, frequency, hesitancy or incontinence. Denies joint pain, swelling and limitation in mobility. Denies headaches, seizures, numbness, or tingling. Denies uncontrolled  depression, anxiety or insomnia. Denies skin break down or rash.   PE  BP 128/84   Pulse 72   Temp (!) 97.5 F (36.4 C) (Temporal)   Resp 15   Ht 5\' 5"  (1.651 m)   Wt 155 lb (70.3 kg)   SpO2 99%   BMI 25.79 kg/m   Patient alert and oriented and in no cardiopulmonary distress.  HEENT: No facial asymmetry, EOMI,     Neck supple .  Chest: Clear to auscultation bilaterally.  CVS: S1, S2 no murmurs, no S3.Regular rate.  ABD: Soft non tender.   Ext: No edema  MS: Adequate ROM spine, shoulders, hips and knees.  Skin: Intact, no ulcerations or rash noted.  Psych: Good eye contact, normal affect. Memory intact not anxious or depressed appearing.  CNS: CN 2-12 intact, power,  normal throughout.no focal deficits noted.   Assessment & Plan  HTN (hypertension) Controlled, no change in medication DASH diet and  commitment to daily physical activity for a minimum of 30 minutes discussed and encouraged, as a part of hypertension management. The importance of attaining a healthy weight is also discussed.  BP/Weight 10/01/2019 08/29/2019 05/01/2019 02/26/2019 01/07/2019 11/19/2018 [id number]  Systolic BP 0000000 Q000111Q [id number] A999333 0000000 [id number]  Diastolic BP 84 72 94 78 80 78 70  Wt. (Lbs) 155 - 149 - 149.08 152.04 150.4  BMI 25.79 - 24.42 - 24.43 24.92 24.67       Moderate persistent asthma, uncomplicated Controlled, no change in medication   Hyperlipidemia LDL goal <100 Hyperlipidemia:Low fat diet discussed and encouraged.   Lipid Panel  Lab Results  Component Value Date   CHOL 157 10/01/2019   HDL 55 10/01/2019   LDLCALC 81 10/01/2019   TRIG 113 10/01/2019   CHOLHDL 2.9 10/01/2019  Controlled, no change in medication      Perennial allergic rhinitis Managed by Allergist and controlled  IGT (impaired glucose tolerance) Patient educated about the importance of limiting  Carbohydrate intake , the need to commit to daily physical activity for a minimum of 30 minutes , and to commit weight loss. The fact that changes in all these areas will reduce or eliminate all together the development of diabetes is stressed.  Improved , she is applauded on this  Diabetic Labs Latest Ref Rng & Units 10/01/2019 01/08/2019 05/29/2018 10/13/2017 04/10/2017  HbA1c <5.7 % of total Hgb 5.9(H) 6.1(H) 5.8(H) 5.9(H) 5.7(H)  Chol <200 mg/dL 157 204(H) 190 263(H) 221(H)  HDL > OR = 50 mg/dL 55 54 46(L) 56 49(L)  Calc LDL mg/dL (calc) 81 128(H) 120(H) 185(H) 152(H)  Triglycerides <150 mg/dL 113 110 129 98 99  Creatinine 0.50 - 1.05 mg/dL 0.83 0.82 0.80 0.81 0.76   BP/Weight 10/01/2019 08/29/2019 05/01/2019 02/26/2019 01/07/2019 11/19/2018 [id number]  Systolic BP 0000000 Q000111Q [id number] A999333 0000000 123456  Diastolic BP 84 72 94 78 80 78 70  Wt. (Lbs) 155 - 149 - 149.08 152.04 150.4  BMI 25.79 - 24.42 - 24.43 24.92 24.67   No flowsheet data  found.

## 2019-10-01 NOTE — Patient Instructions (Signed)
F/u in 6 months,. Call if you need me sooner  Please accept our condolence and support at this difficult time  Pneumonia 23 today   Labs today please, lipid, cmp and EGFR and HBA1C   BP is excellent , continue current medication, healthy food choice and active lifestyle  Thanks for choosing [facility], we consider it a privelige to serve you.

## 2019-10-01 NOTE — Assessment & Plan Note (Signed)
Controlled, no change in medication DASH diet and commitment to daily physical activity for a minimum of 30 minutes discussed and encouraged, as a part of hypertension management. The importance of attaining a healthy weight is also discussed.  BP/Weight 10/01/2019 08/29/2019 05/01/2019 02/26/2019 01/07/2019 11/19/2018 [id number]  Systolic BP 0000000 [id number] A999333 0000000 123456  Diastolic BP 84 72 94 78 80 78 70  Wt. (Lbs) 155 - 149 - 149.08 152.04 150.4  BMI 25.79 - 24.42 - 24.43 24.92 24.67

## 2019-10-02 ENCOUNTER — Encounter: Payer: Self-pay | Admitting: Family Medicine

## 2019-10-02 LAB — COMPLETE METABOLIC PANEL WITH GFR
AG Ratio: 1.6 (calc) (ref 1.0–2.5)
ALT: 16 U/L (ref 6–29)
AST: 18 U/L (ref 10–35)
Albumin: 4.4 g/dL (ref 3.6–5.1)
Alkaline phosphatase (APISO): 65 U/L (ref 37–153)
BUN: 12 mg/dL (ref 7–25)
CO2: 29 mmol/L (ref 20–32)
Calcium: 9.1 mg/dL (ref 8.6–10.4)
Chloride: 104 mmol/L (ref 98–110)
Creat: 0.83 mg/dL (ref 0.50–1.05)
GFR, Est African American: 93 mL/min/{1.73_m2} (ref >=60)
GFR, Est Non African American: 81 mL/min/{1.73_m2} (ref >=60)
Globulin: 2.8 g/dL (calc) (ref 1.9–3.7)
Glucose, Bld: 95 mg/dL (ref 65–139)
Potassium: 3.9 mmol/L (ref 3.5–5.3)
Sodium: 141 mmol/L (ref 135–146)
Total Bilirubin: 0.5 mg/dL (ref 0.2–1.2)
Total Protein: 7.2 g/dL (ref 6.1–8.1)

## 2019-10-02 LAB — HEMOGLOBIN A1C
Hgb A1c MFr Bld: 5.9 % of total Hgb — ABNORMAL HIGH (ref <5.7)
Mean Plasma Glucose: 123 (calc)
eAG (mmol/L): 6.8 (calc)

## 2019-10-02 LAB — LIPID PANEL
Cholesterol: 157 mg/dL (ref <200)
HDL: 55 mg/dL (ref >=50)
LDL Cholesterol (Calc): 81 mg/dL (calc)
Non-HDL Cholesterol (Calc): 102 mg/dL (calc) (ref <130)
Total CHOL/HDL Ratio: 2.9 (calc) (ref <5.0)
Triglycerides: 113 mg/dL (ref <150)

## 2019-10-04 ENCOUNTER — Other Ambulatory Visit: Payer: Self-pay | Admitting: Allergy & Immunology

## 2019-10-05 ENCOUNTER — Encounter: Payer: Self-pay | Admitting: Family Medicine

## 2019-10-05 NOTE — Assessment & Plan Note (Signed)
Controlled, no change in medication  

## 2019-10-05 NOTE — Assessment & Plan Note (Signed)
Hyperlipidemia:Low fat diet discussed and encouraged.   Lipid Panel  Lab Results  Component Value Date   CHOL 157 10/01/2019   HDL 55 10/01/2019   LDLCALC 81 10/01/2019   TRIG 113 10/01/2019   CHOLHDL 2.9 10/01/2019  Controlled, no change in medication

## 2019-10-05 NOTE — Assessment & Plan Note (Signed)
Patient educated about the importance of limiting  Carbohydrate intake , the need to commit to daily physical activity for a minimum of 30 minutes , and to commit weight loss. The fact that changes in all these areas will reduce or eliminate all together the development of diabetes is stressed.  Improved , she is applauded on this  Diabetic Labs Latest Ref Rng & Units 10/01/2019 01/08/2019 05/29/2018 10/13/2017 04/10/2017  HbA1c <5.7 % of total Hgb 5.9(H) 6.1(H) 5.8(H) 5.9(H) 5.7(H)  Chol <200 mg/dL 157 204(H) 190 263(H) 221(H)  HDL > OR = 50 mg/dL 55 54 46(L) 56 49(L)  Calc LDL mg/dL (calc) 81 128(H) 120(H) 185(H) 152(H)  Triglycerides <150 mg/dL 113 110 129 98 99  Creatinine 0.50 - 1.05 mg/dL 0.83 0.82 0.80 0.81 0.76   BP/Weight 10/01/2019 08/29/2019 05/01/2019 02/26/2019 01/07/2019 11/19/2018 [id number]  Systolic BP 0000000 [id number] A999333 0000000 123456  Diastolic BP 84 72 94 78 80 78 70  Wt. (Lbs) 155 - 149 - 149.08 152.04 150.4  BMI 25.79 - 24.42 - 24.43 24.92 24.67   No flowsheet data found.

## 2019-10-05 NOTE — Assessment & Plan Note (Signed)
Managed by Allergist and controlled 

## 2019-10-06 DIAGNOSIS — Z23 Encounter for immunization: Secondary | ICD-10-CM | POA: Diagnosis not present

## 2019-10-06 DIAGNOSIS — I10 Essential (primary) hypertension: Secondary | ICD-10-CM | POA: Diagnosis not present

## 2019-10-08 ENCOUNTER — Other Ambulatory Visit: Payer: Self-pay

## 2019-10-08 ENCOUNTER — Ambulatory Visit (INDEPENDENT_AMBULATORY_CARE_PROVIDER_SITE_OTHER): Payer: BC Managed Care – PPO

## 2019-10-08 DIAGNOSIS — J4550 Severe persistent asthma, uncomplicated: Secondary | ICD-10-CM | POA: Diagnosis not present

## 2019-10-14 ENCOUNTER — Other Ambulatory Visit: Payer: Self-pay

## 2019-10-14 DIAGNOSIS — E785 Hyperlipidemia, unspecified: Secondary | ICD-10-CM

## 2019-10-14 MED ORDER — FLUTICASONE PROPIONATE 50 MCG/ACT NA SUSP: 2.0000 | Freq: Every day | NASAL | 2 refills | Status: DC

## 2019-10-14 MED ORDER — ROSUVASTATIN CALCIUM 20 MG PO TABS: 20.0000 mg | ORAL_TABLET | Freq: Every day | ORAL | 1 refills | Status: DC

## 2019-10-14 MED ORDER — MIRTAZAPINE 15 MG PO TABS: 7.5000 mg | ORAL_TABLET | Freq: Every day | ORAL | 2 refills | Status: DC

## 2019-10-14 MED ORDER — FLUTICASONE PROPIONATE 50 MCG/ACT NA SUSP: 2.0000 | Freq: Every day | NASAL | 3 refills | Status: DC

## 2019-11-26 ENCOUNTER — Encounter: Payer: Self-pay | Admitting: Allergy & Immunology

## 2019-11-26 ENCOUNTER — Ambulatory Visit (INDEPENDENT_AMBULATORY_CARE_PROVIDER_SITE_OTHER): Payer: BC Managed Care – PPO | Admitting: Allergy & Immunology

## 2019-11-26 ENCOUNTER — Other Ambulatory Visit: Payer: Self-pay

## 2019-11-26 DIAGNOSIS — J4550 Severe persistent asthma, uncomplicated: Secondary | ICD-10-CM

## 2019-11-26 DIAGNOSIS — J4520 Mild intermittent asthma, uncomplicated: Secondary | ICD-10-CM | POA: Diagnosis not present

## 2019-11-26 DIAGNOSIS — J3089 Other allergic rhinitis: Secondary | ICD-10-CM | POA: Diagnosis not present

## 2019-11-26 MED ORDER — BREZTRI AEROSPHERE 160-9-4.8 MCG/ACT IN AERO: 2.0000 | INHALATION_SPRAY | Freq: Two times a day (BID) | RESPIRATORY_TRACT | 3 refills | Status: AC

## 2019-11-26 NOTE — Patient Instructions (Signed)
1. Moderate persistent asthma, uncomplicated - We are going to change you from Symbicort and Spiriva to [place] instead. - We will try to send this in and see if we can get this approved. - Spacer sample and teaching provided.  - Daily controller medication(s): Breztri two puffs twice daily with spacer + Singulair 10mg  daily + Fasenra every 8 weeks - Rescue medications: ProAir 4 puffs every 4-6 hours as needed - Asthma control goals:  * Full participation in all desired activities (may need albuterol before activity) * Albuterol use two time or less a week on average (not counting use with activity) * Cough interfering with sleep two time or less a month * Oral steroids no more than once a year * No hospitalizations  2. Chronic allergic rhinitis  - Continue with Qnasl 73mcg two puffs once daily. - Continue with nasal saline rinses 1-2 times daily.  - Continue with Singulair 10mg  daily.  3. No follow-ups on file. This can be an in-person, a virtual Webex or a telephone follow up visit.   Please inform us of any Emergency Department visits, hospitalizations, or changes in symptoms. Call us before going to the ED for breathing or allergy symptoms since we might be able to fit you in for a sick visit. Feel free to contact us anytime with any questions, problems, or concerns.  It was a pleasure to talk to you today today!  Websites that have reliable patient information: 1. American Academy of Asthma, Allergy, and Immunology: www.aaaai.org 2. Food Allergy Research and Education (FARE): foodallergy.org 3. Mothers of Asthmatics: http://www.asthmacommunitynetwork.org 4. American College of Allergy, Asthma, and Immunology: www.acaai.org  "Like" [place] on Facebook and Instagram for our latest updates!        Make sure you are registered to vote! If you have moved or changed any of your contact information, you will need to get this updated before voting!  In some cases, you MAY be able to  register to vote online: [email or web address]

## 2019-11-26 NOTE — Progress Notes (Signed)
RE: Hannah Hays MRN: [id number] DOB: August 27, 1966 Date of Telemedicine Visit: 11/26/2019  Referring provider: [name], MD Primary care provider: [name], MD  Chief Complaint: Medication Management   Telemedicine Follow Up Visit via Telephone: I connected with Otisha Davignon for a follow up on 11/26/19 by telephone and verified that I am speaking with the correct person using two identifiers.   I discussed the limitations, risks, security and privacy concerns of performing an evaluation and management service by telephone and the availability of in person appointments. I also discussed with the patient that there may be a patient responsible charge related to this service. The patient expressed understanding and agreed to proceed.  Patient is at home.  Provider is at the office.  Visit start time: 10:10 AM Visit end time: 10:23 AM Insurance consent/check in by: [facility] Medical consent and medical assistant/nurse: Olivia Mackie  History of Present Illness:  She is a 53 y.o. female, who is being followed for persistent asthma as well as allergic rhinitis. Her previous allergy office visit was in September 2020 with myself.  At that visit, we did add on Spiriva 2 puffs once daily to see if this helps with her shortness of breath.  We continued her Symbicort as well as Singulair and Fasenra.  For her rhinitis, she was continued on Qnasl 80 mcg 2 puffs once daily as well as nasal saline rinses.  She had recently experienced some social trauma, as her 11 year old son-in-law passed away unexpectedly.  Since last visit, she has done well.  She is very pleased to do a telephone visit today because she is taking care of her grand kids today. They are aged 50 months and 7 years.   Asthma/Respiratory Symptom History: She is on the Symbicort two puffs twice daily in combination with the Spiriva two puffs once daily. She remains in the [place].  She has had no prednisone in quite some time.   She feels like the addition of the Spiriva has provided some relief of her symptoms.  ACT score today is 24, indicating excellent asthma control.  She has not needed any prednisone, ER visits, or urgent care visits for her symptoms.  She is not using a spacer and has never used one.  She is open to trying it.  Allergic Rhinitis Symptom History: Allergies are under good control. She has not had any sinus infections at all since the last visit. This is a record for her.  She once required antibiotics 3-4 times per year.  She did get a flu shot.  She is planning to get a COVID-19 shot when she is eligible. Otherwise, there have been no changes to her past medical history, surgical history, family history, or social history.  Assessment and Plan:  Shandell is a 53 y.o. female with:  Moderate persistent asthma, uncomplicated  Perennial allergic rhinitis    1. Moderate persistent asthma, uncomplicated - We are going to change you from Symbicort and Spiriva to [place] instead. - We will try to send this in and see if we can get this approved. - Spacer sample and teaching provided.  - We we will teach you how to use the spacer when you come by to pick up your sample of the new inhaler later today. - Daily controller medication(s): Breztri two puffs twice daily with spacer + Singulair 10mg  daily + Fasenra every 8 weeks - Rescue medications: ProAir 4 puffs every 4-6 hours as needed - Asthma control goals:  *  Full participation in all desired activities (may need albuterol before activity) * Albuterol use two time or less a week on average (not counting use with activity) * Cough interfering with sleep two time or less a month * Oral steroids no more than once a year * No hospitalizations  2. Chronic allergic rhinitis  - Continue with Qnasl 62mcg two puffs once daily. - Continue with nasal saline rinses 1-2 times daily.  - Continue with Singulair 10mg  daily.  3.  Follow-up in 4 months or  earlier if needed. This can be an in-person, a virtual Webex or a telephone follow up visit.   Diagnostics: None.  Medication List:  Current Outpatient Medications  Medication Sig Dispense Refill  . albuterol (PROAIR HFA) 108 (90 Base) MCG/ACT inhaler Inhale 2 puffs into the lungs every 4 (four) hours as needed for wheezing or shortness of breath. 1 Inhaler 1  . amLODipine (NORVASC) 10 MG tablet TAKE 1 TABLET BY MOUTH EVERY DAY 90 tablet 1  . Beclomethasone Dipropionate (QNASL) 80 MCG/ACT AERS Place 2 sprays into both nostrils 2 (two) times daily. 8.7 g 5  . Budeson-Glycopyrrol-Formoterol (BREZTRI AEROSPHERE) 160-9-4.8 MCG/ACT AERO Inhale 2 puffs into the lungs 2 (two) times daily. 32.1 g 3  . Calcium Carbonate-Vitamin D (CALCIUM 600 + D PO) Take 2 tablets by mouth at bedtime.     Marland Kitchen EPINEPHrine (AUVI-Q) 0.3 mg/0.3 mL IJ SOAJ injection Inject 0.3 mg once into the muscle.    . estradiol (VIVELLE-DOT) 0.025 MG/24HR Place 1 patch onto the skin once a week.    Marland Kitchen FASENRA 30 MG/ML SOSY MAINTENANCE: INJECT ONE SYRINGE SUBCUTANEOUSLY EVERY 8 WEEKS 1 Syringe 6  . mirtazapine (REMERON) 15 MG tablet Take 0.5 tablets (7.5 mg total) by mouth at bedtime. 45 tablet 2  . montelukast (SINGULAIR) 10 MG tablet Take 1 tablet (10 mg total) by mouth at bedtime. 30 tablet 5  . Multiple Vitamin (MULTIVITAMIN WITH MINERALS) TABS Take 1 tablet by mouth at bedtime.     . rosuvastatin (CRESTOR) 20 MG tablet Take 1 tablet (20 mg total) by mouth daily. 90 tablet 1   Current Facility-Administered Medications  Medication Dose Route Frequency Provider Last Rate Last Admin  . Benralizumab SOSY 30 mg  30 mg Subcutaneous Q28 days Valentina Shaggy, MD   30 mg at 10/08/19 1034   Allergies: Allergies  Allergen Reactions  . Bacid Rash   I reviewed her past medical history, social history, family history, and environmental history and no significant changes have been reported from previous visits.  Review of Systems    Constitutional: Negative for activity change, appetite change, chills, fatigue and fever.  HENT: Negative for congestion, postnasal drip, rhinorrhea, sinus pressure and sore throat.   Eyes: Negative for pain, discharge, redness and itching.  Respiratory: Negative for shortness of breath, wheezing and stridor.   Gastrointestinal: Negative for diarrhea, nausea and vomiting.  Endocrine: Negative for cold intolerance and heat intolerance.  Musculoskeletal: Negative for arthralgias, joint swelling and myalgias.  Skin: Negative for rash.  Allergic/Immunologic: Negative for environmental allergies, food allergies and immunocompromised state.    Objective:  Physical exam not obtained as encounter was done via telephone.   Previous notes and tests were reviewed.  I discussed the assessment and treatment plan with the patient. The patient was provided an opportunity to ask questions and all were answered. The patient agreed with the plan and demonstrated an understanding of the instructions.   The patient was advised to call back  or seek an in-person evaluation if the symptoms worsen or if the condition fails to improve as anticipated.  I provided 13 minutes of non-face-to-face time during this encounter.  It was my pleasure to participate in [place] care today. Please feel free to contact me with any questions or concerns.   Sincerely,  Valentina Shaggy, MD

## 2019-12-02 ENCOUNTER — Other Ambulatory Visit: Payer: Self-pay | Admitting: Allergy & Immunology

## 2019-12-03 ENCOUNTER — Other Ambulatory Visit: Payer: Self-pay

## 2019-12-03 DIAGNOSIS — E785 Hyperlipidemia, unspecified: Secondary | ICD-10-CM

## 2019-12-03 MED ORDER — ROSUVASTATIN CALCIUM 20 MG PO TABS: 20.0000 mg | ORAL_TABLET | Freq: Every day | ORAL | 0 refills | Status: DC

## 2019-12-09 ENCOUNTER — Other Ambulatory Visit: Payer: Self-pay | Admitting: *Deleted

## 2019-12-09 MED ORDER — FASENRA 30 MG/ML ~~LOC~~ SOSY: 30.0000 mg | PREFILLED_SYRINGE | SUBCUTANEOUS | 8 refills | Status: DC

## 2019-12-10 ENCOUNTER — Ambulatory Visit (INDEPENDENT_AMBULATORY_CARE_PROVIDER_SITE_OTHER): Payer: BC Managed Care – PPO

## 2019-12-10 DIAGNOSIS — J4550 Severe persistent asthma, uncomplicated: Secondary | ICD-10-CM | POA: Diagnosis not present

## 2019-12-29 ENCOUNTER — Ambulatory Visit: Payer: BC Managed Care – PPO | Attending: Internal Medicine

## 2019-12-29 ENCOUNTER — Other Ambulatory Visit: Payer: Self-pay

## 2019-12-29 DIAGNOSIS — Z20822 Contact with and (suspected) exposure to covid-19: Secondary | ICD-10-CM | POA: Diagnosis not present

## 2019-12-30 LAB — NOVEL CORONAVIRUS, NAA: SARS-CoV-2, NAA: NOT DETECTED

## 2020-01-29 ENCOUNTER — Other Ambulatory Visit: Payer: Self-pay

## 2020-01-29 DIAGNOSIS — I10 Essential (primary) hypertension: Secondary | ICD-10-CM

## 2020-01-29 MED ORDER — AMLODIPINE BESYLATE 10 MG PO TABS: 10.0000 mg | ORAL_TABLET | Freq: Every day | ORAL | 0 refills | Status: DC

## 2020-02-04 ENCOUNTER — Ambulatory Visit (INDEPENDENT_AMBULATORY_CARE_PROVIDER_SITE_OTHER): Payer: BC Managed Care – PPO

## 2020-02-04 ENCOUNTER — Other Ambulatory Visit: Payer: Self-pay

## 2020-02-04 DIAGNOSIS — J4550 Severe persistent asthma, uncomplicated: Secondary | ICD-10-CM | POA: Diagnosis not present

## 2020-03-02 ENCOUNTER — Ambulatory Visit: Payer: BC Managed Care – PPO | Attending: Internal Medicine

## 2020-03-02 DIAGNOSIS — Z23 Encounter for immunization: Secondary | ICD-10-CM

## 2020-03-02 NOTE — Progress Notes (Signed)
   Covid-19 Vaccination Clinic  Name:  Hannah Hays    MRN: [id number] DOB: [birth year]  03/02/2020  [name] was observed post Covid-19 immunization for 15 minutes without incident. She was provided with Vaccine Information Sheet and instruction to access the V-Safe system.   [name] was instructed to call 911 with any severe reactions post vaccine: Marland Kitchen Difficulty breathing  . Swelling of face and throat  . A fast heartbeat  . A bad rash all over body  . Dizziness and weakness   Immunizations Administered    Name Date Dose VIS Date Route   Moderna COVID-19 Vaccine 03/02/2020  9:48 AM 0.5 mL 11/04/2019 Intramuscular   Manufacturer: Moderna   Lot: [id number]   [id number]

## 2020-03-11 ENCOUNTER — Telehealth: Payer: Self-pay

## 2020-03-11 NOTE — Telephone Encounter (Signed)
Pt called advised  she had a covid shot on the 30th, and since then she has had a constant headache that has kept her out of work for 2 days, what can you advise her to do

## 2020-03-11 NOTE — Telephone Encounter (Signed)
Needs clinical evaluation, urgent care or other if none available in any  office for evaluation and management, she will need a note work also and that will need to be provided by the clinician who sees her, pls let her know.Hope she gets relief soon

## 2020-03-15 ENCOUNTER — Telehealth: Payer: Self-pay | Admitting: *Deleted

## 2020-03-15 NOTE — Telephone Encounter (Signed)
fmla paper work received via walk in copied noted sleeved

## 2020-03-16 NOTE — Telephone Encounter (Signed)
Pt advised of recommendation with verbal understanding she stated she is feeling better at this time and if it did get worse she would make an appointment

## 2020-03-17 ENCOUNTER — Ambulatory Visit: Payer: BC Managed Care – PPO | Admitting: Family Medicine

## 2020-03-25 ENCOUNTER — Ambulatory Visit: Payer: BC Managed Care – PPO

## 2020-03-26 ENCOUNTER — Ambulatory Visit (INDEPENDENT_AMBULATORY_CARE_PROVIDER_SITE_OTHER): Payer: BC Managed Care – PPO | Admitting: Allergy & Immunology

## 2020-03-26 ENCOUNTER — Encounter: Payer: Self-pay | Admitting: Allergy & Immunology

## 2020-03-26 ENCOUNTER — Other Ambulatory Visit: Payer: Self-pay

## 2020-03-26 VITALS — BP 144/80 | HR 64 | Temp 97.6°F | Resp 16 | Ht 67.0 in | Wt 157.2 lb

## 2020-03-26 DIAGNOSIS — J3089 Other allergic rhinitis: Secondary | ICD-10-CM

## 2020-03-26 DIAGNOSIS — J4550 Severe persistent asthma, uncomplicated: Secondary | ICD-10-CM | POA: Diagnosis not present

## 2020-03-26 MED ORDER — ALBUTEROL SULFATE HFA 108 (90 BASE) MCG/ACT IN AERS: 2.0000 | INHALATION_SPRAY | RESPIRATORY_TRACT | 1 refills | Status: DC | PRN

## 2020-03-26 MED ORDER — BREZTRI AEROSPHERE 160-9-4.8 MCG/ACT IN AERO: 2.0000 | INHALATION_SPRAY | Freq: Two times a day (BID) | RESPIRATORY_TRACT | 5 refills | Status: DC

## 2020-03-26 NOTE — Patient Instructions (Addendum)
1. Moderate persistent asthma, uncomplicated - We are going to work on getting the [employer] approved.  - Another sample provided. - We treated you with a nebulizer treatment today for your asthma attack.  - Spacer sample and teaching provided.  - Daily controller medication(s): Breztri two puffs twice daily with spacer + Singulair 10mg  daily + Fasenra every 8 weeks - Rescue medications: ProAir 4 puffs every 4-6 hours as needed - Asthma control goals:  * Full participation in all desired activities (may need albuterol before activity) * Albuterol use two time or less a week on average (not counting use with activity) * Cough interfering with sleep two time or less a month * Oral steroids no more than once a year * No hospitalizations  2. Chronic allergic rhinitis  - Continue with Qnasl 43mcg two puffs once daily. - Continue with nasal saline rinses 1-2 times daily.  - Continue with Singulair 10mg  daily.  3. Return in about 6 months (around 09/25/2020). This can be an in-person, a virtual Webex or a telephone follow up visit.  I do not think that vaccine testing is needed at this time since your reaction was not within the immediate time frame. I would consider getting the second vaccine so that you are fully protected.    Please inform us of any Emergency Department visits, hospitalizations, or changes in symptoms. Call us before going to the ED for breathing or allergy symptoms since we might be able to fit you in for a sick visit. Feel free to contact us anytime with any questions, problems, or concerns.  It was a pleasure to see you again today!  Websites that have reliable patient information: 1. American Academy of Asthma, Allergy, and Immunology: www.aaaai.org 2. Food Allergy Research and Education (FARE): foodallergy.org 3. Mothers of Asthmatics: http://www.asthmacommunitynetwork.org 4. American College of Allergy, Asthma, and Immunology: www.acaai.org   COVID-19 Vaccine  Information can be found at: [email or web address].uk For questions related to vaccine distribution or appointments, please email [email or web address] or call [phone].     "Like" [place] on Facebook and Instagram for our latest updates!       HAPPY SPRING!  Make sure you are registered to vote! If you have moved or changed any of your contact information, you will need to get this updated before voting!  In some cases, you MAY be able to register to vote online: [email or web address]

## 2020-03-26 NOTE — Progress Notes (Signed)
FOLLOW UP  Date of Service/Encounter:  03/26/20   Assessment:   Moderate persistent asthma, uncomplicated  Perennial allergic rhinitis  Adverse food reaction  Plan/Recommendations:   1. Moderate persistent asthma, uncomplicated - We are going to work on getting the [employer] approved.  - Another sample provided. - We treated you with a nebulizer treatment today for your asthma attack.  - Spacer sample and teaching provided.  - Daily controller medication(s): Breztri two puffs twice daily with spacer + Singulair 10mg  daily + Fasenra every 8 weeks - Rescue medications: ProAir 4 puffs every 4-6 hours as needed - Asthma control goals:  * Full participation in all desired activities (may need albuterol before activity) * Albuterol use two time or less a week on average (not counting use with activity) * Cough interfering with sleep two time or less a month * Oral steroids no more than once a year * No hospitalizations  2. Chronic allergic rhinitis  - Continue with Qnasl 48mcg two puffs once daily. - Continue with nasal saline rinses 1-2 times daily.  - Continue with Singulair 10mg  daily.  3. Possible adverse vaccine reaction - I do not think that vaccine testing is needed at this time since your reaction was not within the immediate time frame.  - I would consider getting the second vaccine so that you are fully protected.  - I did give the option of the J&J, but she had a reaction to the flu shot at one point, making this slightly risky.   3. Return in about 6 months (around 09/25/2020). This can be an in-person, a virtual Webex or a telephone follow up visit.     Subjective:   Hannah Hays is a 54 y.o. female presenting today for follow up of  Chief Complaint  Patient presents with  . Asthma    Recently had COVID vaccine and had asthma flare. Was doing well prior to that.     TAKARRA Hays has a history of the following: Patient Active Problem List   Diagnosis Date Noted  . Insomnia 08/07/2018  . Hyperlipidemia LDL goal <100 10/16/2017  . Perennial allergic rhinitis 08/28/2017  . Moderate persistent asthma, uncomplicated [id number]  . Hx of adenomatous colonic polyps   . Lipoma 12/26/2015  . Liver mass, left lobe 02/11/2015  . Nasal polyposis 08/20/2014  . Chronic nonallergic rhinitis 08/20/2014  . Vitamin D deficiency 07/12/2012  . HTN (hypertension) 08/19/2011  . GERD (gastroesophageal reflux disease) 04/11/2011  . IBS 12/19/2010  . IGT (impaired glucose tolerance) 12/26/2009  . Asthma 11/15/2007    History obtained from: chart review and patient.  Hannah Hays is a 54 y.o. female presenting for a follow up visit.  She has a history of moderate persistent asthma.  At the last visit in December, we changed her from Symbicort to [facility] 2 puffs twice daily.  We also continued with Berna Bue every 8 weeks and Singulair 10 mg daily.  Her allergic rhinitis was controlled with Qnasl as well as Singulair and nasal saline rinses.  Since last visit, she has mostly done well. She did receive her COVID19 vaccine at the end of March. A few days later, she developed a terrible headache. She felt very "drained" over the following week or so. None of these symptoms happened until 3 days after aside from soreness on the arm. The fatigue and difficulty breathing started three days after the injection (this was Monday after Easter). She reports chills off and on since that time.  She has been afebrile. She did have diarrhea after the shot as well. All symptoms cleared up two days ago completely. She never did get tested for COVID19, but there were no other sick people in the house. She does get flu shots without issues.   She does report a stitch in left side , but this was even before the Moderna vaccine. This occurs whenever she has any kind of coughing fits.  Asthma/Respiratory Symptom History: She is on the [place] two puffs twice daily. She is unsure  whether this works any better than the Symbicort, but she is willing to continue to take it.  She has and any prednisone.  She is going to reschedule her Berna Bue so that it does not coincide with her second Moderna vaccine.  She has not been to the hospital and has not been to urgent care for any asthma issues.  As always, she tends to have a lot of her issues at work when she is exposed to fumes, especially cleaning products.  She is able to make accommodations at work such that she can close the door and stuffed towels under the door so that she does not smell any of it.  Allergic Rhinitis Symptom History: She remains on the Qnasl as well as Singulair and the nasal saline rinses.  She has not needed any antibiotics.  She endorses some postnasal drip, but overall is not terrible.  Otherwise, there have been no changes to her past medical history, surgical history, family history, or social history.    Review of Systems  Constitutional: Negative.  Negative for chills, fever, malaise/fatigue and weight loss.  HENT: Negative.  Negative for congestion, ear discharge and ear pain.   Eyes: Negative for pain, discharge and redness.  Respiratory: Positive for shortness of breath and wheezing. Negative for cough and sputum production.   Cardiovascular: Negative.  Negative for chest pain and palpitations.  Gastrointestinal: Negative for abdominal pain, heartburn, nausea and vomiting.  Skin: Negative.  Negative for itching and rash.  Neurological: Negative for dizziness and headaches.  Endo/Heme/Allergies: Negative for environmental allergies. Does not bruise/bleed easily.       Objective:   Blood pressure (!) 144/80, pulse 64, temperature 97.6 F (36.4 C), temperature source Temporal, resp. rate 16, height 5\' 7"  (1.702 m), weight 157 lb 3.2 oz (71.3 kg), SpO2 96 %. Body mass index is 24.62 kg/m.   Physical Exam:  Physical Exam  Constitutional: She appears well-developed.  Initially, she did  have some shortness of breath and difficulty talking.  This was after we did the spirometry.  When she walked in, she looks very comfortable.  We did talk her through some breathing exercises and gave her a Xopenex and Atrovent nebulizer treatment with improvement in her symptoms.  HENT:  Head: Normocephalic and atraumatic.  Right Ear: Tympanic membrane, external ear and ear canal normal.  Left Ear: Tympanic membrane and ear canal normal.  Nose: No mucosal edema, rhinorrhea, nasal deformity or septal deviation. No epistaxis. Right sinus exhibits no maxillary sinus tenderness and no frontal sinus tenderness. Left sinus exhibits no maxillary sinus tenderness and no frontal sinus tenderness.  Mouth/Throat: Uvula is midline and oropharynx is clear and moist. Mucous membranes are not pale and not dry.  Eyes: Pupils are equal, round, and reactive to light. Conjunctivae and EOM are normal. Right eye exhibits no chemosis and no discharge. Left eye exhibits no chemosis and no discharge. Right conjunctiva is not injected. Left conjunctiva is not injected.  Cardiovascular: Normal rate, regular rhythm and normal heart sounds.  Respiratory: Effort normal and breath sounds normal. No accessory muscle usage. No tachypnea. No respiratory distress. She has no wheezes. She has no rhonchi. She has no rales. She exhibits no tenderness.  Hyperventilating initially, but there was no wheezing appreciated.  She was moving air and much more comfortable after the nebulizer treatment.  Lymphadenopathy:    She has no cervical adenopathy.  Neurological: She is alert.  Skin: No abrasion, no petechiae and no rash noted. Rash is not papular, not vesicular and not urticarial. No erythema. No pallor.  Psychiatric: She has a normal mood and affect.     Diagnostic studies:    Spirometry: results abnormal (FEV1: 1.22/49%, FVC: 1.70/54%, FEV1/FVC: 72%).    Spirometry consistent with possible restrictive disease.  However, she  developed hyperventilation after 1 attempt which resolved after the nebulizer treatment as above.  We did not attempt another one.  This was all clearly effort dependent.   Allergy Studies: none     [name], MD  [facility]

## 2020-03-31 ENCOUNTER — Ambulatory Visit: Payer: Self-pay

## 2020-04-01 ENCOUNTER — Ambulatory Visit: Payer: BC Managed Care – PPO | Attending: Internal Medicine

## 2020-04-01 DIAGNOSIS — Z23 Encounter for immunization: Secondary | ICD-10-CM

## 2020-04-01 NOTE — Progress Notes (Signed)
   Covid-19 Vaccination Clinic  Name:  Hannah Hays    MRN: [id number] DOB: 09-18-1966  04/01/2020  [name] was observed post Covid-19 immunization for 15 minutes without incident. She was provided with Vaccine Information Sheet and instruction to access the V-Safe system.   [name] was instructed to call 911 with any severe reactions post vaccine: Marland Kitchen Difficulty breathing  . Swelling of face and throat  . A fast heartbeat  . A bad rash all over body  . Dizziness and weakness   Immunizations Administered    Name Date Dose VIS Date Route   Moderna COVID-19 Vaccine 04/01/2020 10:40 AM 0.5 mL 11/2019 Intramuscular   Manufacturer: Moderna   Lot: [id number]   [id number]

## 2020-04-05 ENCOUNTER — Ambulatory Visit: Payer: Self-pay

## 2020-04-09 ENCOUNTER — Ambulatory Visit (INDEPENDENT_AMBULATORY_CARE_PROVIDER_SITE_OTHER): Payer: BC Managed Care – PPO

## 2020-04-09 ENCOUNTER — Telehealth: Payer: Self-pay | Admitting: *Deleted

## 2020-04-09 ENCOUNTER — Ambulatory Visit: Payer: Self-pay

## 2020-04-09 ENCOUNTER — Other Ambulatory Visit: Payer: Self-pay

## 2020-04-09 DIAGNOSIS — J4550 Severe persistent asthma, uncomplicated: Secondary | ICD-10-CM | POA: Diagnosis not present

## 2020-04-09 NOTE — Telephone Encounter (Signed)
errir 

## 2020-04-14 ENCOUNTER — Ambulatory Visit: Payer: BC Managed Care – PPO | Admitting: Family Medicine

## 2020-04-22 ENCOUNTER — Encounter: Payer: Self-pay | Admitting: Family Medicine

## 2020-04-22 ENCOUNTER — Other Ambulatory Visit (HOSPITAL_COMMUNITY): Payer: Self-pay | Admitting: Family Medicine

## 2020-04-22 ENCOUNTER — Other Ambulatory Visit: Payer: Self-pay

## 2020-04-22 ENCOUNTER — Ambulatory Visit (INDEPENDENT_AMBULATORY_CARE_PROVIDER_SITE_OTHER): Payer: BC Managed Care – PPO | Admitting: Family Medicine

## 2020-04-22 ENCOUNTER — Ambulatory Visit: Payer: BC Managed Care – PPO | Attending: Internal Medicine

## 2020-04-22 VITALS — BP 144/80 | Ht 65.0 in | Wt 155.0 lb

## 2020-04-22 DIAGNOSIS — Z20822 Contact with and (suspected) exposure to covid-19: Secondary | ICD-10-CM

## 2020-04-22 DIAGNOSIS — G43809 Other migraine, not intractable, without status migrainosus: Secondary | ICD-10-CM

## 2020-04-22 DIAGNOSIS — R432 Parageusia: Secondary | ICD-10-CM | POA: Diagnosis not present

## 2020-04-22 DIAGNOSIS — Z1231 Encounter for screening mammogram for malignant neoplasm of breast: Secondary | ICD-10-CM

## 2020-04-22 MED ORDER — RIZATRIPTAN BENZOATE 5 MG PO TABS: 5.0000 mg | ORAL_TABLET | ORAL | 0 refills | Status: DC | PRN

## 2020-04-22 MED ORDER — IBUPROFEN 800 MG PO TABS: 800.0000 mg | ORAL_TABLET | Freq: Three times a day (TID) | ORAL | 1 refills | Status: DC | PRN

## 2020-04-22 NOTE — Assessment & Plan Note (Signed)
She reports a loss to taste ever since she had her Covid vaccines.  We will do a repeat Covid test if negative will look at referral to neurology to make sure there is nothing else possibly going on might need an MRI in the future referral will be made based off of Covid test result.

## 2020-04-22 NOTE — Patient Instructions (Signed)
I appreciate the opportunity to provide you with care for your health and wellness. Today we discussed: Migraines  Follow up: 4 weeks for migraines  No labs or referrals today  Please take 800 mg of ibuprofen/Aleve at the onset of a headache you may repeat this in 8 to 12 hours.  Do not repeat it more than twice daily.  If you are having the onset of a migraine where you cannot function and you have to lay down lights or sound bother you or your nauseated please take the Maxalt you can repeat it 2 hours afterwards if needed.  If you are using the Maxalt more than twice a week I will need to know about this.  Covid test.  Pending what Covid test results we will look at referring you to neurology for better work-up of migraines.  Please continue to practice social distancing to keep you, your family, and our community safe.  If you must go out, please wear a mask and practice good handwashing.  It was a pleasure to see you and I look forward to continuing to work together on your health and well-being. Please do not hesitate to call the office if you need care or have questions about your care.  Have a wonderful day and week. With Gratitude, Cherly Beach, DNP, AGNP-BC

## 2020-04-22 NOTE — Assessment & Plan Note (Signed)
She has not had headaches in multiple years.  Did have a period where she had them quite frequently and then she had a 10-year period where she did not have any.  They returned in 2011 and she reports today that she did not have them until she got her Covid vaccines.  We will start her on Maxalt for migraines and Advil for headaches. We will do a repeat Covid test as she has no sense of taste.  If she has a negative Covid test we will look at referring her to neurology for better work-up of migraines.  Reviewed side effects, risks and benefits of medication.   Patient acknowledged agreement and understanding of the plan.

## 2020-04-22 NOTE — Progress Notes (Signed)
Virtual Visit via Telephone Note   This visit type was conducted due to national recommendations for restrictions regarding the COVID-19 Pandemic (e.g. social distancing) in an effort to limit this patient's exposure and mitigate transmission in our community.  Due to her co-morbid illnesses, this patient is at least at moderate risk for complications without adequate follow up.  This format is felt to be most appropriate for this patient at this time.  The patient did not have access to video technology/had technical difficulties with video requiring transitioning to audio format only (telephone).  All issues noted in this document were discussed and addressed.  No physical exam could be performed with this format.    Evaluation Performed:  Follow-up visit  Date:  04/22/2020   ID:  Hannah Hays, DOB 09-27-1966, MRN [id number]  Patient Location: Home Provider Location: Office  Location of Patient: Home Location of Provider: Telehealth Consent was obtain for visit to be over via telehealth. I verified that I am speaking with the correct person using two identifiers.  PCP:  [name], MD   Chief Complaint: Headaches  History of Present Illness:    Hannah Hays is a 54 y.o. female with history of anxiety, depression, GERD, hypertension, migraine headache reoccurrence first part of 2011 after having 10 years without them.  She reports that her migraines came back 2 to 3 days after having her first Covid injection back in March.  She reports is on the right side temple area.  Sometimes it last 1 to 2 days.  Is a throbbing and pulling pain.  Lights and noise aggravate it.  Reports Aleve sometimes helps.  Occurrences every 2 to 3 days.  Today she is a 2 out of 10.  However she reports that she has had no taste since 3 days after the first Covid vaccine as well.  She has had a sinus surgery in which she has had a decrease in smell that has not changed much since her surgery.  She  denies having any exposure that she is aware of her being around anybody that had any recent symptoms.  She has had Covid testing in the past.  However that was back in June of last year in January of this year prior to injections and they were both negative.  Additionally she does suffer from high blood pressure and her blood pressure is reading elevated today.  However she does not think that her blood pressure has anything to really do with her migraines.  She is not against having a repeat Covid test just to make sure everything is okay.  The patient does not have symptoms concerning for COVID-19 infection (fever, chills, cough, or new shortness of breath).   Past Medical, Surgical, Social History, Allergies, and Medications have been Reviewed.  Past Medical History:  Diagnosis Date  . Allergic rhinitis, seasonal   . Anxiety   . Asthma   . Depression   . GERD (gastroesophageal reflux disease)   . Helicobacter pylori gastritis 2009   s/p Prevpac treatment  . History of colonoscopy 2008   with simple adenoma  . Hypertension   . IBS (irritable bowel syndrome)    pre-dominant diarrhea  . Migraine headache 17   recurrence 1st time in 10 year in 2011   Past Surgical History:  Procedure Laterality Date  . ABDOMINAL HYSTERECTOMY    . CHOLECYSTECTOMY    . COLONOSCOPY  11/13/2007   SLF:A 4 mm sessile transverse colon  polyp/Random biopsies obtained throughout the colon/Otherwise normal. Negative for microscopic colitis, simple adenoma, due for surveillance 11/2017 per [facility]  . COLONOSCOPY N/A 02/02/2016   Procedure: COLONOSCOPY;  Surgeon: Danie Binder, MD;  [facility];  Service: Endoscopy;  Laterality: N/A;  12:15 PM  . ESOPHAGOGASTRODUODENOSCOPY  03/20/2008   SLF:A 1- to 2-cm hiatal hernia, mild erythema in the antrum/Normal esophagus without evidence of Barrett's, +H.pylori s/p Prevapc treatment   . ESOPHAGOGASTRODUODENOSCOPY (EGD) WITH ESOPHAGEAL DILATION N/A 02/14/2013    [id number] Esophageal web/MILD Non-erosive gastritis (inflammation)  . PARTIAL HYSTERECTOMY  2002  . SEPTOPLASTY N/A 08/16/2015   Procedure: SEPTOPLASTY;  Surgeon: Leta Baptist, MD;  Location: [facility];  Service: ENT;  Laterality: N/A;  . SINUS ENDO WITH FUSION Bilateral 08/16/2015   Procedure: ETHMOIDECTOMY, SPHENOIDECTOMY, MAXILLARY ANTROSTOMY, FRONTAL RECESS EXPLORATION WITH FUSION NAVIGATION;  Surgeon: Leta Baptist, MD;  Location: [place];  Service: ENT;  Laterality: Bilateral;  . TUBAL LIGATION  1995     Current Meds  Medication Sig  . albuterol (PROAIR HFA) 108 (90 Base) MCG/ACT inhaler Inhale 2 puffs into the lungs every 4 (four) hours as needed for wheezing or shortness of breath.  Marland Kitchen amLODipine (NORVASC) 10 MG tablet Take 1 tablet (10 mg total) by mouth daily.  . Beclomethasone Dipropionate (QNASL) 80 MCG/ACT AERS Place 2 sprays into both nostrils 2 (two) times daily.  . Benralizumab (FASENRA) 30 MG/ML SOSY Inject 30 mg into the skin every 8 (eight) weeks.  Marland Kitchen BREZTRI AEROSPHERE 160-9-4.8 MCG/ACT AERO Inhale 2 puffs into the lungs 2 (two) times daily.  . Calcium Carbonate-Vitamin D (CALCIUM 600 + D PO) Take 2 tablets by mouth at bedtime.   Marland Kitchen EPINEPHrine (AUVI-Q) 0.3 mg/0.3 mL IJ SOAJ injection Inject 0.3 mg once into the muscle.  . estradiol (CLIMARA - DOSED IN MG/24 HR) 0.025 mg/24hr patch APPLY 1 PATCH EXTERNALLY TO THE SKIN EVERY WEEK IN THE EVENING FOR 7 DAYS  . estradiol (VIVELLE-DOT) 0.025 MG/24HR Place 1 patch onto the skin once a week.  . mirtazapine (REMERON) 15 MG tablet Take 0.5 tablets (7.5 mg total) by mouth at bedtime.  . montelukast (SINGULAIR) 10 MG tablet Take 1 tablet (10 mg total) by mouth at bedtime.  . Multiple Vitamin (MULTIVITAMIN WITH MINERALS) TABS Take 1 tablet by mouth at bedtime.   . rosuvastatin (CRESTOR) 20 MG tablet Take 1 tablet (20 mg total) by mouth daily.   Current Facility-Administered Medications for the 04/22/20 encounter  (Office Visit) with Perlie Mayo, NP  Medication  . Benralizumab SOSY 30 mg     Allergies:   Bacid   ROS:   Please see the history of present illness.    All other systems reviewed and are negative.   Labs/Other Tests and Data Reviewed:    Recent Labs: 10/01/2019: ALT 16; BUN 12; Creat 0.83; Potassium 3.9; Sodium 141   Recent Lipid Panel Lab Results  Component Value Date/Time   CHOL 157 10/01/2019 02:35 PM   CHOL 230 09/16/2015 12:00 AM   TRIG 113 10/01/2019 02:35 PM   HDL 55 10/01/2019 02:35 PM   CHOLHDL 2.9 10/01/2019 02:35 PM   LDLCALC 81 10/01/2019 02:35 PM    Wt Readings from Last 3 Encounters:  04/22/20 155 lb (70.3 kg)  03/26/20 157 lb 3.2 oz (71.3 kg)  10/01/19 155 lb (70.3 kg)     Objective:    Vital Signs:  BP (!) 144/80   Ht 5\' 5"  (1.651 m)  Wt 155 lb (70.3 kg)   BMI 25.79 kg/m    VITAL SIGNS:  reviewed GEN:  alert and oriented RESPIRATORY:  no shortness of breath in conversation  PSYCH:  normal affect  ASSESSMENT & PLAN:    1. Other migraine without status migrainosus, not intractable  - rizatriptan (MAXALT) 5 MG tablet; Take 1 tablet (5 mg total) by mouth as needed for migraine. May repeat in 2 hours if needed  Dispense: 10 tablet; Refill: 0 - ibuprofen (ADVIL) 800 MG tablet; Take 1 tablet (800 mg total) by mouth every 8 (eight) hours as needed for headache.  Dispense: 20 tablet; Refill: 1   2. Loss of taste    Time:   Today, I have spent 10 minutes with the patient with telehealth technology discussing the above problems.     Medication Adjustments/Labs and Tests Ordered: Current medicines are reviewed at length with the patient today.  Concerns regarding medicines are outlined above.   Tests Ordered: No orders of the defined types were placed in this encounter.   Medication Changes: No orders of the defined types were placed in this encounter.   Disposition:  Follow up 4 weeks Signed, Perlie Mayo, NP  04/22/2020 10:44  AM     [place]

## 2020-04-23 LAB — NOVEL CORONAVIRUS, NAA: SARS-CoV-2, NAA: NOT DETECTED

## 2020-04-23 LAB — SARS-COV-2, NAA 2 DAY TAT

## 2020-05-01 ENCOUNTER — Other Ambulatory Visit: Payer: Self-pay | Admitting: Family Medicine

## 2020-05-01 DIAGNOSIS — I10 Essential (primary) hypertension: Secondary | ICD-10-CM

## 2020-05-20 ENCOUNTER — Other Ambulatory Visit: Payer: Self-pay

## 2020-05-20 ENCOUNTER — Telehealth (INDEPENDENT_AMBULATORY_CARE_PROVIDER_SITE_OTHER): Payer: BC Managed Care – PPO | Admitting: Family Medicine

## 2020-05-20 ENCOUNTER — Encounter: Payer: Self-pay | Admitting: Family Medicine

## 2020-05-20 VITALS — BP 144/80 | Ht 65.0 in | Wt 155.0 lb

## 2020-05-20 DIAGNOSIS — G43809 Other migraine, not intractable, without status migrainosus: Secondary | ICD-10-CM

## 2020-05-20 NOTE — Progress Notes (Signed)
Virtual Visit via Telephone Note   This visit type was conducted due to national recommendations for restrictions regarding the COVID-19 Pandemic (e.g. social distancing) in an effort to limit this patient's exposure and mitigate transmission in our community.  Due to her co-morbid illnesses, this patient is at least at moderate risk for complications without adequate follow up.  This format is felt to be most appropriate for this patient at this time.  The patient did not have access to video technology/had technical difficulties with video requiring transitioning to audio format only (telephone).  All issues noted in this document were discussed and addressed.  No physical exam could be performed with this format.    Evaluation Performed:  Follow-up visit  Date:  05/20/2020   ID:  Hannah Hays, DOB Jan 16, 1966, MRN [id number]  Patient Location: Home Provider Location: Office  Location of Patient: Home Location of Provider: Telehealth Consent was obtain for visit to be over via telehealth. I verified that I am speaking with the correct person using two identifiers.  PCP:  [name], MD   Chief Complaint: Migraine follow-up  History of Present Illness:    Hannah Hays is a 54 y.o. female with history of having migraines, depression, anxiety, hypertension among others.  Is here for 4-week follow-up on migraines.  She had not had headaches in multiple years.  She did have a period where she had them quite frequently and that she went through about 10 years where she did not have any.  They returned in 2011 and today she reports that they are a little bit improved but she still getting 2 of them a week that are significantly impairing.  She was started on Maxalt and she believes that that is very helpful.  And she is taking Advil.  She is willing to have referral to neurology for better work-up of migraines since she still having to frequently.  The patient does not have  symptoms concerning for COVID-19 infection (fever, chills, cough, or new shortness of breath).   Past Medical, Surgical, Social History, Allergies, and Medications have been Reviewed.  Past Medical History:  Diagnosis Date  . Allergic rhinitis, seasonal   . Anxiety   . Asthma   . Depression   . GERD (gastroesophageal reflux disease)   . Helicobacter pylori gastritis 2009   s/p Prevpac treatment  . History of colonoscopy 2008   with simple adenoma  . Hypertension   . IBS (irritable bowel syndrome)    pre-dominant diarrhea  . Migraine headache 17   recurrence 1st time in 10 year in 2011   Past Surgical History:  Procedure Laterality Date  . ABDOMINAL HYSTERECTOMY    . CHOLECYSTECTOMY    . COLONOSCOPY  11/13/2007   SLF:A 4 mm sessile transverse colon polyp/Random biopsies obtained throughout the colon/Otherwise normal. Negative for microscopic colitis, simple adenoma, due for surveillance 11/2017 per [facility]  . COLONOSCOPY N/A 02/02/2016   Procedure: COLONOSCOPY;  Surgeon: Danie Binder, MD;  [facility];  Service: Endoscopy;  Laterality: N/A;  12:15 PM  . ESOPHAGOGASTRODUODENOSCOPY  03/20/2008   SLF:A 1- to 2-cm hiatal hernia, mild erythema in the antrum/Normal esophagus without evidence of Barrett's, +H.pylori s/p Prevapc treatment   . ESOPHAGOGASTRODUODENOSCOPY (EGD) WITH ESOPHAGEAL DILATION N/A 02/14/2013   [id number] Esophageal web/MILD Non-erosive gastritis (inflammation)  . PARTIAL HYSTERECTOMY  2002  . SEPTOPLASTY N/A 08/16/2015   Procedure: SEPTOPLASTY;  Surgeon: Leta Baptist, MD;  Location: [facility];  Service: ENT;  Laterality: N/A;  . SINUS ENDO WITH FUSION Bilateral 08/16/2015   Procedure: ETHMOIDECTOMY, SPHENOIDECTOMY, MAXILLARY ANTROSTOMY, FRONTAL RECESS EXPLORATION WITH FUSION NAVIGATION;  Surgeon: Leta Baptist, MD;  Location: [place];  Service: ENT;  Laterality: Bilateral;  . TUBAL LIGATION  1995     Current Meds  Medication Sig  .  albuterol (PROAIR HFA) 108 (90 Base) MCG/ACT inhaler Inhale 2 puffs into the lungs every 4 (four) hours as needed for wheezing or shortness of breath.  Marland Kitchen amLODipine (NORVASC) 10 MG tablet TAKE 1 TABLET(10 MG) BY MOUTH DAILY  . Beclomethasone Dipropionate (QNASL) 80 MCG/ACT AERS Place 2 sprays into both nostrils 2 (two) times daily.  . Benralizumab (FASENRA) 30 MG/ML SOSY Inject 30 mg into the skin every 8 (eight) weeks.  Marland Kitchen BREZTRI AEROSPHERE 160-9-4.8 MCG/ACT AERO Inhale 2 puffs into the lungs 2 (two) times daily.  . Calcium Carbonate-Vitamin D (CALCIUM 600 + D PO) Take 2 tablets by mouth at bedtime.   Marland Kitchen EPINEPHrine (AUVI-Q) 0.3 mg/0.3 mL IJ SOAJ injection Inject 0.3 mg once into the muscle.  . estradiol (CLIMARA - DOSED IN MG/24 HR) 0.025 mg/24hr patch APPLY 1 PATCH EXTERNALLY TO THE SKIN EVERY WEEK IN THE EVENING FOR 7 DAYS  . ibuprofen (ADVIL) 800 MG tablet Take 1 tablet (800 mg total) by mouth every 8 (eight) hours as needed for headache.  . mirtazapine (REMERON) 15 MG tablet Take 0.5 tablets (7.5 mg total) by mouth at bedtime.  . montelukast (SINGULAIR) 10 MG tablet Take 1 tablet (10 mg total) by mouth at bedtime.  . Multiple Vitamin (MULTIVITAMIN WITH MINERALS) TABS Take 1 tablet by mouth at bedtime.   . rizatriptan (MAXALT) 5 MG tablet Take 1 tablet (5 mg total) by mouth as needed for migraine. May repeat in 2 hours if needed  . rosuvastatin (CRESTOR) 20 MG tablet Take 1 tablet (20 mg total) by mouth daily.   Current Facility-Administered Medications for the 05/20/20 encounter (Video Visit) with Perlie Mayo, NP  Medication  . Benralizumab SOSY 30 mg     Allergies:   Bacid   ROS:   Please see the history of present illness.     All other systems reviewed and are negative.   Labs/Other Tests and Data Reviewed:    Recent Labs: 10/01/2019: ALT 16; BUN 12; Creat 0.83; Potassium 3.9; Sodium 141   Recent Lipid Panel Lab Results  Component Value Date/Time   CHOL 157 10/01/2019  02:35 PM   CHOL 230 09/16/2015 12:00 AM   TRIG 113 10/01/2019 02:35 PM   HDL 55 10/01/2019 02:35 PM   CHOLHDL 2.9 10/01/2019 02:35 PM   LDLCALC 81 10/01/2019 02:35 PM    Wt Readings from Last 3 Encounters:  05/20/20 155 lb (70.3 kg)  04/22/20 155 lb (70.3 kg)  03/26/20 157 lb 3.2 oz (71.3 kg)     Objective:    Vital Signs:  BP (!) 144/80   Ht 5\' 5"  (1.651 m)   Wt 155 lb (70.3 kg)   BMI 25.79 kg/m    VITAL SIGNS:  reviewed GEN:  Alert and oriented RESPIRATORY:  No shortness of breath noted in conversation PSYCH:  Normal affect and mood  ASSESSMENT & PLAN:    1. Other migraine without status migrainosus, not intractable  - Ambulatory referral to Neurology   Time:   Today, I have spent 10 minutes with the patient with telehealth technology discussing the above problems.     Medication  Adjustments/Labs and Tests Ordered: Current medicines are reviewed at length with the patient today.  Concerns regarding medicines are outlined above.   Tests Ordered: No orders of the defined types were placed in this encounter.   Medication Changes: No orders of the defined types were placed in this encounter.   Disposition:  Follow up 3 months with Dr. Moshe Cipro Signed, Perlie Mayo, NP  05/20/2020 10:41 AM     [place]

## 2020-05-20 NOTE — Patient Instructions (Signed)
I appreciate the opportunity to provide you with care for your health and wellness. Today we discussed: Migraine follow-up  Follow up: 3 months in office with Dr. Moshe Cipro  No labs  Referrals today for neurology for migraine assessment and treatment  Please continue to practice social distancing to keep you, your family, and our community safe.  If you must go out, please wear a mask and practice good handwashing.  It was a pleasure to see you and I look forward to continuing to work together on your health and well-being. Please do not hesitate to call the office if you need care or have questions about your care.  Have a wonderful day and week. With Gratitude, Cherly Beach, DNP, AGNP-BC

## 2020-05-20 NOTE — Assessment & Plan Note (Signed)
Continue Maxalt at this time.  Advil as needed.  Referral to neurology for migraines.

## 2020-05-31 ENCOUNTER — Ambulatory Visit (HOSPITAL_COMMUNITY): Payer: BC Managed Care – PPO

## 2020-05-31 ENCOUNTER — Ambulatory Visit (HOSPITAL_COMMUNITY)
Admission: RE | Admit: 2020-05-31 | Discharge: 2020-05-31 | Disposition: A | Discharge status: Home / Self Care | Payer: BC Managed Care – PPO | Source: Ambulatory Visit | Attending: Family Medicine | Admitting: Family Medicine

## 2020-05-31 ENCOUNTER — Other Ambulatory Visit: Payer: Self-pay

## 2020-05-31 DIAGNOSIS — Z1231 Encounter for screening mammogram for malignant neoplasm of breast: Secondary | ICD-10-CM

## 2020-06-04 ENCOUNTER — Ambulatory Visit: Payer: Self-pay

## 2020-06-09 DIAGNOSIS — Z1389 Encounter for screening for other disorder: Secondary | ICD-10-CM | POA: Diagnosis not present

## 2020-06-09 DIAGNOSIS — Z130 Encounter for screening for diseases of the blood and blood-forming organs and certain disorders involving the immune mechanism: Secondary | ICD-10-CM | POA: Diagnosis not present

## 2020-06-09 DIAGNOSIS — N9089 Other specified noninflammatory disorders of vulva and perineum: Secondary | ICD-10-CM | POA: Diagnosis not present

## 2020-06-09 DIAGNOSIS — E669 Obesity, unspecified: Secondary | ICD-10-CM | POA: Diagnosis not present

## 2020-06-09 DIAGNOSIS — Z01419 Encounter for gynecological examination (general) (routine) without abnormal findings: Secondary | ICD-10-CM | POA: Diagnosis not present

## 2020-06-11 ENCOUNTER — Other Ambulatory Visit: Payer: Self-pay

## 2020-06-11 ENCOUNTER — Ambulatory Visit (INDEPENDENT_AMBULATORY_CARE_PROVIDER_SITE_OTHER): Payer: BC Managed Care – PPO

## 2020-06-11 DIAGNOSIS — J4550 Severe persistent asthma, uncomplicated: Secondary | ICD-10-CM | POA: Diagnosis not present

## 2020-06-30 ENCOUNTER — Ambulatory Visit (INDEPENDENT_AMBULATORY_CARE_PROVIDER_SITE_OTHER): Payer: BC Managed Care – PPO | Admitting: Family Medicine

## 2020-06-30 ENCOUNTER — Encounter: Payer: Self-pay | Admitting: Family Medicine

## 2020-06-30 ENCOUNTER — Other Ambulatory Visit: Payer: Self-pay

## 2020-06-30 VITALS — BP 145/71 | HR 67 | Resp 16 | Ht 65.5 in | Wt 159.0 lb

## 2020-06-30 DIAGNOSIS — J4550 Severe persistent asthma, uncomplicated: Secondary | ICD-10-CM

## 2020-06-30 DIAGNOSIS — I10 Essential (primary) hypertension: Secondary | ICD-10-CM | POA: Diagnosis not present

## 2020-06-30 DIAGNOSIS — Z1159 Encounter for screening for other viral diseases: Secondary | ICD-10-CM

## 2020-06-30 DIAGNOSIS — E559 Vitamin D deficiency, unspecified: Secondary | ICD-10-CM

## 2020-06-30 DIAGNOSIS — E663 Overweight: Secondary | ICD-10-CM | POA: Insufficient documentation

## 2020-06-30 DIAGNOSIS — G4700 Insomnia, unspecified: Secondary | ICD-10-CM | POA: Diagnosis not present

## 2020-06-30 DIAGNOSIS — G43919 Migraine, unspecified, intractable, without status migrainosus: Secondary | ICD-10-CM

## 2020-06-30 DIAGNOSIS — E785 Hyperlipidemia, unspecified: Secondary | ICD-10-CM

## 2020-06-30 MED ORDER — MIRTAZAPINE 15 MG PO TABS: ORAL_TABLET | ORAL | 1 refills | Status: DC

## 2020-06-30 MED ORDER — SPIRONOLACTONE 25 MG PO TABS: 25.0000 mg | ORAL_TABLET | Freq: Every day | ORAL | 3 refills | Status: DC

## 2020-06-30 NOTE — Assessment & Plan Note (Signed)
Uncontrolled add spironolactone DASH diet and commitment to daily physical activity for a minimum of 30 minutes discussed and encouraged, as a part of hypertension management. The importance of attaining a healthy weight is also discussed.  BP/Weight 06/30/2020 05/20/2020 04/22/2020 03/26/2020 10/01/2019 08/29/2019 8/83/3744  Systolic BP 514 604 799 872 158 727 618  Diastolic BP 71 80 80 80 84 72 94  Wt. (Lbs) 159 155 155 157.2 155 - 149  BMI 26.06 25.79 25.79 24.62 25.79 - 24.42

## 2020-06-30 NOTE — Assessment & Plan Note (Signed)
Sleep hygiene reviewed and written information offered also. Prescription sent for  medication needed.  

## 2020-06-30 NOTE — Patient Instructions (Addendum)
F/U in office re eval blood pressure in 6 to 8 weeks, call if you need me sooner  Spironolactone added for uncontrolled blood pressure    Fasting CBC, lipid, cmp and eGFr, tSH , Vit D and hepatitis C screen tomorrow  It is important that you exercise regularly at least 30 minutes 5 times a week. If you develop chest pain, have severe difficulty breathing, or feel very tired, stop exercising immediately and seek medical attention  Try to reduce portion sizes to help with weight loss  Thanks for choosing [facility], we consider it a privelige to serve you.

## 2020-06-30 NOTE — Assessment & Plan Note (Signed)
  Patient re-educated about  the importance of commitment to a  minimum of 150 minutes of exercise per week as able.  The importance of healthy food choices with portion control discussed, as well as eating regularly and within a 12 hour window most days. The need to choose "clean , green" food 50 to 75% of the time is discussed, as well as to make water the primary drink and set a goal of 64 ounces water daily.    Weight /BMI 06/30/2020 05/20/2020 04/22/2020  WEIGHT 159 lb 155 lb 155 lb  HEIGHT 5' 5.5" 5\' 5"  5\' 5"   BMI 26.06 kg/m2 25.79 kg/m2 25.79 kg/m2

## 2020-07-01 DIAGNOSIS — E785 Hyperlipidemia, unspecified: Secondary | ICD-10-CM | POA: Diagnosis not present

## 2020-07-01 DIAGNOSIS — E559 Vitamin D deficiency, unspecified: Secondary | ICD-10-CM | POA: Diagnosis not present

## 2020-07-01 DIAGNOSIS — Z1159 Encounter for screening for other viral diseases: Secondary | ICD-10-CM | POA: Diagnosis not present

## 2020-07-01 DIAGNOSIS — E663 Overweight: Secondary | ICD-10-CM | POA: Diagnosis not present

## 2020-07-01 DIAGNOSIS — I10 Essential (primary) hypertension: Secondary | ICD-10-CM | POA: Diagnosis not present

## 2020-07-02 LAB — CBC
Hematocrit: 38 % (ref 34.0–46.6)
Hemoglobin: 12.3 g/dL (ref 11.1–15.9)
MCH: 27.5 pg (ref 26.6–33.0)
MCHC: 32.4 g/dL (ref 31.5–35.7)
MCV: 85 fL (ref 79–97)
Platelets: 192 10*3/uL (ref 150–450)
RBC: 4.47 x10E6/uL (ref 3.77–5.28)
RDW: 14.8 % (ref 11.7–15.4)
WBC: 6 10*3/uL (ref 3.4–10.8)

## 2020-07-02 LAB — CMP14+EGFR
ALT: 16 IU/L (ref 0–32)
AST: 16 IU/L (ref 0–40)
Albumin/Globulin Ratio: 1.6 (ref 1.2–2.2)
Albumin: 4.6 g/dL (ref 3.8–4.9)
Alkaline Phosphatase: 81 IU/L (ref 48–121)
BUN/Creatinine Ratio: 14 (ref 9–23)
BUN: 12 mg/dL (ref 6–24)
Bilirubin Total: 0.3 mg/dL (ref 0.0–1.2)
CO2: 23 mmol/L (ref 20–29)
Calcium: 9.4 mg/dL (ref 8.7–10.2)
Chloride: 106 mmol/L (ref 96–106)
Creatinine, Ser: 0.83 mg/dL (ref 0.57–1.00)
GFR calc Af Amer: 92 mL/min/{1.73_m2} (ref >59)
GFR calc non Af Amer: 80 mL/min/{1.73_m2} (ref >59)
Globulin, Total: 2.8 g/dL (ref 1.5–4.5)
Glucose: 92 mg/dL (ref 65–99)
Potassium: 4.2 mmol/L (ref 3.5–5.2)
Sodium: 144 mmol/L (ref 134–144)
Total Protein: 7.4 g/dL (ref 6.0–8.5)

## 2020-07-02 LAB — LIPID PANEL
Chol/HDL Ratio: 2.8 ratio (ref 0.0–4.4)
Cholesterol, Total: 144 mg/dL (ref 100–199)
HDL: 52 mg/dL (ref >39)
LDL Chol Calc (NIH): 72 mg/dL (ref 0–99)
Triglycerides: 111 mg/dL (ref 0–149)
VLDL Cholesterol Cal: 20 mg/dL (ref 5–40)

## 2020-07-02 LAB — HEPATITIS C ANTIBODY: Hep C Virus Ab: <0.1 s/co ratio (ref 0.0–0.9)

## 2020-07-02 LAB — TSH: TSH: 1.53 u[IU]/mL (ref 0.450–4.500)

## 2020-07-02 LAB — VITAMIN D 25 HYDROXY (VIT D DEFICIENCY, FRACTURES): Vit D, 25-Hydroxy: 25.1 ng/mL — ABNORMAL LOW (ref 30.0–100.0)

## 2020-07-03 ENCOUNTER — Encounter: Payer: Self-pay | Admitting: Family Medicine

## 2020-07-03 NOTE — Assessment & Plan Note (Signed)
Controlled, no change in medication  

## 2020-07-03 NOTE — Progress Notes (Signed)
Hannah Hays     MRN: [id number]      DOB: September 11, 1966   HPI Hannah Hays is here for follow up and re-evaluation of chronic medical conditions, medication management and review of any available recent lab and radiology data.  Preventive health is updated, specifically  Cancer screening and Immunization.   Questions or concerns regarding consultations or procedures which the PT has had in the interim are  addressed. The PT denies any adverse reactions to current medications since the last visit.  There are no new concerns.  There are no specific complaints   ROS Denies recent fever or chills. Denies sinus pressure, nasal congestion, ear pain or sore throat. Denies chest congestion, productive cough or wheezing. Denies chest pains, palpitations and leg swelling Denies abdominal pain, nausea, vomiting,diarrhea or constipation.   Denies dysuria, frequency, hesitancy or incontinence. Denies joint pain, swelling and limitation in mobility. Denies headaches, seizures, numbness, or tingling. Denies depression, anxiety or insomnia. Denies skin break down or rash.   PE  BP (!) 145/71   Pulse 67   Resp 16   Ht 5' 5.5" (1.664 m)   Wt 159 lb (72.1 kg)   SpO2 96%   BMI 26.06 kg/m   Patient alert and oriented and in no cardiopulmonary distress.  HEENT: No facial asymmetry, EOMI,     Neck supple .  Chest: Clear to auscultation bilaterally.  CVS: S1, S2 no murmurs, no S3.Regular rate.  ABD: Soft non tender.   Ext: No edema  MS: Adequate ROM spine, shoulders, hips and knees.  Skin: Intact, no ulcerations or rash noted.  Psych: Good eye contact, normal affect. Memory intact not anxious or depressed appearing.  CNS: CN 2-12 intact, power,  normal throughout.no focal deficits noted.   Assessment & Plan  HTN (hypertension) Uncontrolled add spironolactone DASH diet and commitment to daily physical activity for a minimum of 30 minutes discussed and encouraged, as a part of  hypertension management. The importance of attaining a healthy weight is also discussed.  BP/Weight 06/30/2020 05/20/2020 04/22/2020 03/26/2020 10/01/2019 08/29/2019 2/35/3614  Systolic BP 431 540 086 761 950 932 671  Diastolic BP 71 80 80 80 84 72 94  Wt. (Lbs) 159 155 155 157.2 155 - 149  BMI 26.06 25.79 25.79 24.62 25.79 - 24.42       Overweight (BMI 25.0-29.9)  Patient re-educated about  the importance of commitment to a  minimum of 150 minutes of exercise per week as able.  The importance of healthy food choices with portion control discussed, as well as eating regularly and within a 12 hour window most days. The need to choose "clean , green" food 50 to 75% of the time is discussed, as well as to make water the primary drink and set a goal of 64 ounces water daily.    Weight /BMI 06/30/2020 05/20/2020 04/22/2020  WEIGHT 159 lb 155 lb 155 lb  HEIGHT 5' 5.5" 5\' 5"  5\' 5"   BMI 26.06 kg/m2 25.79 kg/m2 25.79 kg/m2      Insomnia Sleep hygiene reviewed and written information offered also. Prescription sent for  medication needed.   Asthma Controlled, no change in medication Managed by allergist  Migraine Controlled, no change in medication   Hyperlipidemia LDL goal <100 Hyperlipidemia:Low fat diet discussed and encouraged.   Lipid Panel  Lab Results  Component Value Date   CHOL 144 07/01/2020   HDL 52 07/01/2020   LDLCALC 72 07/01/2020   TRIG 111 07/01/2020  CHOLHDL 2.8 07/01/2020     Controlled, no change in medication

## 2020-07-03 NOTE — Assessment & Plan Note (Signed)
Controlled, no change in medication Managed by allergist

## 2020-07-03 NOTE — Assessment & Plan Note (Signed)
Hyperlipidemia:Low fat diet discussed and encouraged.   Lipid Panel  Lab Results  Component Value Date   CHOL 144 07/01/2020   HDL 52 07/01/2020   LDLCALC 72 07/01/2020   TRIG 111 07/01/2020   CHOLHDL 2.8 07/01/2020  Controlled, no change in medication     

## 2020-07-20 ENCOUNTER — Ambulatory Visit: Payer: BC Managed Care – PPO | Admitting: Neurology

## 2020-07-28 ENCOUNTER — Other Ambulatory Visit: Payer: Self-pay | Admitting: Family Medicine

## 2020-07-28 DIAGNOSIS — E785 Hyperlipidemia, unspecified: Secondary | ICD-10-CM

## 2020-08-06 ENCOUNTER — Ambulatory Visit: Payer: Self-pay

## 2020-08-13 ENCOUNTER — Ambulatory Visit (INDEPENDENT_AMBULATORY_CARE_PROVIDER_SITE_OTHER): Payer: BC Managed Care – PPO

## 2020-08-13 ENCOUNTER — Other Ambulatory Visit: Payer: Self-pay

## 2020-08-13 DIAGNOSIS — J4550 Severe persistent asthma, uncomplicated: Secondary | ICD-10-CM

## 2020-08-24 ENCOUNTER — Encounter: Payer: Self-pay | Admitting: Family Medicine

## 2020-08-24 ENCOUNTER — Ambulatory Visit (INDEPENDENT_AMBULATORY_CARE_PROVIDER_SITE_OTHER): Payer: BC Managed Care – PPO | Admitting: Family Medicine

## 2020-08-24 ENCOUNTER — Other Ambulatory Visit: Payer: Self-pay

## 2020-08-24 VITALS — BP 161/81 | HR 68 | Resp 16 | Ht 65.5 in | Wt 157.0 lb

## 2020-08-24 DIAGNOSIS — R7302 Impaired glucose tolerance (oral): Secondary | ICD-10-CM | POA: Diagnosis not present

## 2020-08-24 DIAGNOSIS — J4550 Severe persistent asthma, uncomplicated: Secondary | ICD-10-CM

## 2020-08-24 DIAGNOSIS — I10 Essential (primary) hypertension: Secondary | ICD-10-CM

## 2020-08-24 DIAGNOSIS — E785 Hyperlipidemia, unspecified: Secondary | ICD-10-CM | POA: Diagnosis not present

## 2020-08-24 DIAGNOSIS — J3089 Other allergic rhinitis: Secondary | ICD-10-CM

## 2020-08-24 DIAGNOSIS — G43919 Migraine, unspecified, intractable, without status migrainosus: Secondary | ICD-10-CM

## 2020-08-24 DIAGNOSIS — Z23 Encounter for immunization: Secondary | ICD-10-CM

## 2020-08-24 LAB — POCT GLYCOSYLATED HEMOGLOBIN (HGB A1C): Hemoglobin A1C: 6.1 % — AB (ref 4.0–5.6)

## 2020-08-24 MED ORDER — SPIRONOLACTONE 25 MG PO TABS: 25.0000 mg | ORAL_TABLET | Freq: Every day | ORAL | 3 refills | Status: DC

## 2020-08-24 MED ORDER — ROSUVASTATIN CALCIUM 20 MG PO TABS: 20.0000 mg | ORAL_TABLET | Freq: Every day | ORAL | 3 refills | Status: DC

## 2020-08-24 NOTE — Progress Notes (Signed)
Hannah Hays     MRN: [id number]      DOB: June 27, 1966   HPI Hannah Hays is here for follow up and re-evaluation of uncontrolled hypertension, she unfortunately did not fully understand or follow through on medication change so blood pressure is still elevated.  There are no specific complaints   ROS Denies recent fever or chills. Denies sinus pressure, nasal congestion, ear pain or sore throat. Denies chest congestion, productive cough or wheezing. Denies chest pains, palpitations and leg swelling Denies abdominal pain, nausea, vomiting,diarrhea or constipation.   Denies dysuria, frequency, hesitancy or incontinence. Denies joint pain, swelling and limitation in mobility. Denies headaches, seizures, numbness, or tingling. Denies depression, anxiety or insomnia. Denies skin break down or rash.   PE  BP (!) 161/81   Pulse 68   Resp 16   Ht 5' 5.5" (1.664 m)   Wt 157 lb (71.2 kg)   SpO2 96%   BMI 25.73 kg/m   Patient alert and oriented and in no cardiopulmonary distress.  HEENT: No facial asymmetry, EOMI,     Neck supple .  Chest: Clear to auscultation bilaterally.  CVS: S1, S2 no murmurs, no S3.Regular rate.  ABD: Soft non tender.   Ext: No edema  MS: Adequate ROM spine, shoulders, hips and knees.  Skin: Intact, no ulcerations or rash noted.  Psych: Good eye contact, normal affect. Memory intact not anxious or depressed appearing.  CNS: CN 2-12 intact, power,  normal throughout.no focal deficits noted.   Assessment & Plan  HTN (hypertension) Uncontrolled due to non intentional non compliance  Re eval in 2 weeks DASH diet and commitment to daily physical activity for a minimum of 30 minutes discussed and encouraged, as a part of hypertension management. The importance of attaining a healthy weight is also discussed.  BP/Weight 08/24/2020 06/30/2020 05/20/2020 04/22/2020 03/26/2020 10/01/2019 3/55/7322  Systolic BP 025 427 062 376 283 151 761  Diastolic BP 81  71 80 80 80 84 72  Wt. (Lbs) 157 159 155 155 157.2 155 -  BMI 25.73 26.06 25.79 25.79 24.62 25.79 -       IGT (impaired glucose tolerance) Patient educated about the importance of limiting  Carbohydrate intake , the need to commit to daily physical activity for a minimum of 30 minutes , and to commit weight loss. The fact that changes in all these areas will reduce or eliminate all together the development of diabetes is stressed.  Deteriorated, needs to reduce sweets and carbs  Diabetic Labs Latest Ref Rng & Units 08/24/2020 07/01/2020 10/01/2019 01/08/2019 05/29/2018  HbA1c 4.0 - 5.6 % 6.1(A) - 5.9(H) 6.1(H) 5.8(H)  Chol 100 - 199 mg/dL - 144 157 204(H) 190  HDL >39 mg/dL - 52 55 54 46(L)  Calc LDL 0 - 99 mg/dL - 72 81 128(H) 120(H)  Triglycerides 0 - 149 mg/dL - 111 113 110 129  Creatinine 0.57 - 1.00 mg/dL - 0.83 0.83 0.82 0.80   BP/Weight 08/24/2020 06/30/2020 05/20/2020 04/22/2020 03/26/2020 10/01/2019 05/10/3709  Systolic BP 626 948 546 270 350 093 818  Diastolic BP 81 71 80 80 80 84 72  Wt. (Lbs) 157 159 155 155 157.2 155 -  BMI 25.73 26.06 25.79 25.79 24.62 25.79 -   No flowsheet data found.    Hyperlipidemia LDL goal <100 Hyperlipidemia:Low fat diet discussed and encouraged.   Lipid Panel  Lab Results  Component Value Date   CHOL 144 07/01/2020   HDL 52 07/01/2020   LDLCALC  72 07/01/2020   TRIG 111 07/01/2020   CHOLHDL 2.8 07/01/2020  Controlled, no change in medication      Perennial allergic rhinitis Controlled, no change in medication   Asthma Controlled, no change in medication   Migraine Controlled, no change in medication

## 2020-08-24 NOTE — Patient Instructions (Signed)
Follow-up in office with MD mid January call if you need me before.  Flu vaccine in office today.  Glycohemoglobin in office today.  Nurse visit in 2 weeks to reevaluate blood pressure.  Medications for your blood pressure amlodipine 10 mg 1 daily and spironolactone 25 mg 1 daily.  Please take as directed.  Fasting lipids CMP and EGFR 1 week before January follow-up visit.  It is important that you exercise regularly at least 30 minutes 5 times a week. If you develop chest pain, have severe difficulty breathing, or feel very tired, stop exercising immediately and seek medical attention  Think about what you will eat, plan ahead. Choose " clean, green, fresh or frozen" over canned, processed or packaged foods which are more sugary, salty and fatty. 70 to 75% of food eaten should be vegetables and fruit. Three meals at set times with snacks allowed between meals, but they must be fruit or vegetables. Aim to eat over a 12 hour period , example 7 am to 7 pm, and STOP after  your last meal of the day. Drink water,generally about 64 ounces per day, no other drink is as healthy. Fruit juice is best enjoyed in a healthy way, by EATING the fruit. Thanks for choosing [facility], we consider it a privelige to serve you.

## 2020-08-28 ENCOUNTER — Encounter: Payer: Self-pay | Admitting: Family Medicine

## 2020-08-28 NOTE — Assessment & Plan Note (Signed)
Hyperlipidemia:Low fat diet discussed and encouraged.   Lipid Panel  Lab Results  Component Value Date   CHOL 144 07/01/2020   HDL 52 07/01/2020   LDLCALC 72 07/01/2020   TRIG 111 07/01/2020   CHOLHDL 2.8 07/01/2020  Controlled, no change in medication

## 2020-08-28 NOTE — Assessment & Plan Note (Signed)
Patient educated about the importance of limiting  Carbohydrate intake , the need to commit to daily physical activity for a minimum of 30 minutes , and to commit weight loss. The fact that changes in all these areas will reduce or eliminate all together the development of diabetes is stressed.  Deteriorated, needs to reduce sweets and carbs  Diabetic Labs Latest Ref Rng & Units 08/24/2020 07/01/2020 10/01/2019 01/08/2019 05/29/2018  HbA1c 4.0 - 5.6 % 6.1(A) - 5.9(H) 6.1(H) 5.8(H)  Chol 100 - 199 mg/dL - 144 157 204(H) 190  HDL >39 mg/dL - 52 55 54 46(L)  Calc LDL 0 - 99 mg/dL - 72 81 128(H) 120(H)  Triglycerides 0 - 149 mg/dL - 111 113 110 129  Creatinine 0.57 - 1.00 mg/dL - 0.83 0.83 0.82 0.80   BP/Weight 08/24/2020 06/30/2020 05/20/2020 04/22/2020 03/26/2020 10/01/2019 3/53/6144  Systolic BP 315 400 867 619 509 326 712  Diastolic BP 81 71 80 80 80 84 72  Wt. (Lbs) 157 159 155 155 157.2 155 -  BMI 25.73 26.06 25.79 25.79 24.62 25.79 -   No flowsheet data found.

## 2020-08-28 NOTE — Assessment & Plan Note (Signed)
Controlled, no change in medication  

## 2020-08-28 NOTE — Assessment & Plan Note (Signed)
Uncontrolled due to non intentional non compliance  Re eval in 2 weeks DASH diet and commitment to daily physical activity for a minimum of 30 minutes discussed and encouraged, as a part of hypertension management. The importance of attaining a healthy weight is also discussed.  BP/Weight 08/24/2020 06/30/2020 05/20/2020 04/22/2020 03/26/2020 10/01/2019 03/13/8578  Systolic BP 079 310 914 560 278 296 039  Diastolic BP 81 71 80 80 80 84 72  Wt. (Lbs) 157 159 155 155 157.2 155 -  BMI 25.73 26.06 25.79 25.79 24.62 25.79 -

## 2020-08-30 ENCOUNTER — Other Ambulatory Visit: Payer: Self-pay | Admitting: Family Medicine

## 2020-08-30 DIAGNOSIS — I10 Essential (primary) hypertension: Secondary | ICD-10-CM

## 2020-09-07 ENCOUNTER — Ambulatory Visit: Payer: BC Managed Care – PPO

## 2020-09-07 ENCOUNTER — Other Ambulatory Visit: Payer: Self-pay

## 2020-09-07 VITALS — BP 136/74

## 2020-09-07 DIAGNOSIS — I10 Essential (primary) hypertension: Secondary | ICD-10-CM

## 2020-09-07 NOTE — Progress Notes (Signed)
bp good. Pt to continue all meds

## 2020-09-24 ENCOUNTER — Other Ambulatory Visit: Payer: Self-pay

## 2020-09-24 ENCOUNTER — Ambulatory Visit (INDEPENDENT_AMBULATORY_CARE_PROVIDER_SITE_OTHER): Payer: BC Managed Care – PPO | Admitting: Allergy & Immunology

## 2020-09-24 ENCOUNTER — Encounter: Payer: Self-pay | Admitting: Allergy & Immunology

## 2020-09-24 VITALS — BP 136/80 | HR 65 | Resp 17

## 2020-09-24 DIAGNOSIS — J3089 Other allergic rhinitis: Secondary | ICD-10-CM

## 2020-09-24 DIAGNOSIS — J4550 Severe persistent asthma, uncomplicated: Secondary | ICD-10-CM

## 2020-09-24 NOTE — Patient Instructions (Addendum)
1. Moderate persistent asthma, uncomplicated - Lung testing was normal today. - We are not going to make any changes today. - We are not going to make any medication changes at this time.  - Daily controller medication(s): Breztri 1-2 puffs twice daily with spacer + Singulair 10mg  daily + Fasenra every 8 weeks - Rescue medications: ProAir 4 puffs every 4-6 hours as needed - Asthma control goals:  * Full participation in all desired activities (may need albuterol before activity) * Albuterol use two time or less a week on average (not counting use with activity) * Cough interfering with sleep two time or less a month * Oral steroids no more than once a year * No hospitalizations  2. Chronic allergic rhinitis  - Continue with Qnasl 60mcg two puffs once daily as needed.  - Continue with nasal saline rinses 1-2 times daily.  - Continue with Singulair 10mg  daily.  3. COVID vaccine - Make an appointment for a Moderna #3 next Friday. - We can see your husband as well for his third dose.   4. Return in about 6 months (around 03/25/2021).    Please inform us of any Emergency Department visits, hospitalizations, or changes in symptoms. Call us before going to the ED for breathing or allergy symptoms since we might be able to fit you in for a sick visit. Feel free to contact us anytime with any questions, problems, or concerns.  It was a pleasure to see you again today!  Websites that have reliable patient information: 1. American Academy of Asthma, Allergy, and Immunology: www.aaaai.org 2. Food Allergy Research and Education (FARE): foodallergy.org 3. Mothers of Asthmatics: http://www.asthmacommunitynetwork.org 4. American College of Allergy, Asthma, and Immunology: www.acaai.org   COVID-19 Vaccine Information can be found at: ShippingScam.co.uk For questions related to vaccine distribution or appointments, please email  vaccine@ .[email or web address] or call [phone].     "Like" [place] on Facebook and Instagram for our latest updates!     HAPPY FALL!     Make sure you are registered to vote! If you have moved or changed any of your contact information, you will need to get this updated before voting!  In some cases, you MAY be able to register to vote online: [email or web address]

## 2020-09-24 NOTE — Progress Notes (Signed)
FOLLOW UP  Date of Service/Encounter:  09/24/20   Assessment:   Moderate persistent asthma, uncomplicated  Perennial allergic rhinitis  Adverse food reaction  Plan/Recommendations:   1. Moderate persistent asthma, uncomplicated - Lung testing was normal today. - We are not going to make any changes today. - We are not going to make any medication changes at this time.  - Daily controller medication(s): Breztri 1-2 puffs twice daily with spacer + Singulair 10mg  daily + Fasenra every 8 weeks - Rescue medications: ProAir 4 puffs every 4-6 hours as needed - Asthma control goals:  * Full participation in all desired activities (may need albuterol before activity) * Albuterol use two time or less a week on average (not counting use with activity) * Cough interfering with sleep two time or less a month * Oral steroids no more than once a year * No hospitalizations  2. Chronic allergic rhinitis  - Continue with Qnasl 9mcg two puffs once daily as needed.  - Continue with nasal saline rinses 1-2 times daily.  - Continue with Singulair 10mg  daily.  3. COVID vaccine - Make an appointment for a Moderna #3 next Friday. - We can see your husband as well for his third dose.   4. Return in about 6 months (around 03/25/2021).    Subjective:   Hannah Hays is a 54 y.o. female presenting today for follow up of No chief complaint on file.   Hannah Hays has a history of the following: Patient Active Problem List   Diagnosis Date Noted  . Overweight (BMI 25.0-29.9) 06/30/2020  . Insomnia 08/07/2018  . Hyperlipidemia LDL goal <100 10/16/2017  . Perennial allergic rhinitis 08/28/2017  . Moderate persistent asthma, uncomplicated 52/84/1324  . Hx of adenomatous colonic polyps   . Lipoma 12/26/2015  . Liver mass, left lobe 02/11/2015  . Nasal polyposis 08/20/2014  . Chronic nonallergic rhinitis 08/20/2014  . Vitamin D deficiency 07/12/2012  . HTN (hypertension) 08/19/2011   . GERD (gastroesophageal reflux disease) 04/11/2011  . IBS 12/19/2010  . Migraine 01/19/2010  . IGT (impaired glucose tolerance) 12/26/2009  . Asthma 11/15/2007    History obtained from: chart review and patient  Hannah Hays is a 54 y.o. female presenting for a follow up visit.  She was last seen in April 2021.  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  We  Since the last visit, she has done very well.   Asthma/Respiratory Symptom History: She remains on Breztri two puffs twice daily. We did give her samples at the last visit. Hannah Hays's asthma has been well controlled. She has not required rescue medication, experienced nocturnal awakenings due to lower respiratory symptoms, nor have activities of daily living been limited. She has required no Emergency Department or Urgent Care visits for her asthma. She has required zero courses of systemic steroids for asthma exacerbations since the last visit. ACT score today is 22, indicating excellent asthma symptom control.   She did finally get the [facility] approved but she is paying $50 per month for it. She would say that the majority of her symptoms are hard to pin down. She works from 10:30pm until [place]. She did work last night. 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 She is going to be watching two of them today. She actually does use one puff when she is able to get away with it. If she feels tight, she does use it. She does not hesitate to use if she needs it.  She has not needed prednisone in quite a long time.   Allergic Rhinitis Symptom History: Her allergies have been acting up a bit when the weather changed. She is using the Qnasl daily as well as montelukast. She does not use the Qnasl every day because she is developing some burning of her nostrils. She uses this as needed. 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 She gets the Qnasl from her PCP's office. She purchased it once but otherwise she has been suriviging with samples.  She has not needed antibiotics at all since the last visit.    She ended up getting Moderna vaccine. She got sick with the first one, but not the second one. She received her second dose in May 2021. She is open to getting the one at our office. She did have some problems with the first vaccination with generalized malaise and muscle aches. However, the second one went much more smoothly.   Otherwise, there have been no changes to her past medical history, surgical history, family history, or social history.    Review of Systems  Constitutional: Negative.  Negative for chills, fever, malaise/fatigue and weight loss.  HENT: Negative for congestion, ear discharge, ear pain and sinus pain.   Eyes: Negative for pain, discharge and redness.  Respiratory: Negative for cough, sputum production, shortness of breath and wheezing.   Cardiovascular: Negative.  Negative for chest pain and palpitations.  Gastrointestinal: Negative for abdominal pain, constipation, diarrhea, heartburn, nausea and vomiting.  Skin: Negative.  Negative for itching and rash.  Neurological: Negative for dizziness and headaches.  Endo/Heme/Allergies: Positive for environmental allergies. Does not bruise/bleed easily.       Objective:   Blood pressure 136/80, pulse 65, resp. rate 17, SpO2 99 %. There is no height or weight on file to calculate BMI.   Physical Exam:  Physical Exam Constitutional:      Appearance: She is well-developed.     Comments: Pleasant female.  Cooperative with the exam.  HENT:     Head: Normocephalic and atraumatic.     Right Ear: Tympanic membrane, ear canal and external ear normal.     Left Ear: Tympanic membrane, ear canal and external ear normal.     Nose: No nasal deformity, septal deviation, mucosal edema or rhinorrhea.     Right Turbinates: Enlarged.     Left Turbinates: Enlarged.     Right Sinus: No maxillary sinus tenderness or frontal sinus tenderness.     Left Sinus: No  maxillary sinus tenderness or frontal sinus tenderness.     Comments: There is some clear rhinorrhea bilaterally.  Distended.    Mouth/Throat:     Mouth: Mucous membranes are not pale and not dry.     Pharynx: Uvula midline.  Eyes:     General:        Right eye: No discharge.        Left eye: No discharge.     Conjunctiva/sclera: Conjunctivae normal.     Right eye: Right conjunctiva is not injected. No chemosis.    Left eye: Left conjunctiva is not injected. No chemosis.    Pupils: Pupils are equal, round, and reactive to light.  Cardiovascular:     Rate and Rhythm: Normal rate and regular rhythm.     Heart sounds: Normal heart sounds.  Pulmonary:     Effort: Pulmonary effort is normal. No tachypnea, accessory muscle usage or respiratory distress.     Breath sounds: Normal breath sounds. No wheezing,  rhonchi or rales.     Comments: Moving air well in all lung fields. No increased work of breathing noted.  Chest:     Chest wall: No tenderness.  Lymphadenopathy:     Cervical: No cervical adenopathy.  Skin:    Coloration: Skin is not pale.     Findings: No abrasion, erythema, petechiae or rash. Rash is not papular, urticarial or vesicular.     Comments: No eczematous or urticarial lesions noted.   Neurological:     Mental Status: She is alert.  Psychiatric:        Behavior: Behavior is cooperative.      Diagnostic studies:    Spirometry: results abnormal (FEV1: 1.60/56%, FVC: 2.31/68%, FEV1/FVC: 69%).    Spirometry consistent with possible restrictive disease. Overall values are stable  Compared to previous spirometric findings.   Allergy Studies: none       [name], MD  [facility]

## 2020-09-27 ENCOUNTER — Telehealth: Payer: Self-pay

## 2020-09-27 NOTE — Telephone Encounter (Signed)
Prior authorization for [facility] submitted on covermymeds.

## 2020-09-29 NOTE — Progress Notes (Unsigned)
This encounter was created in error - please disregard.  This encounter was created in error - please disregard.

## 2020-09-29 NOTE — Telephone Encounter (Signed)
Received a fax from patient's insurance stating that Hannah Hays was covered under the patient's plan and Prior Authorization was not needed at this time.

## 2020-10-06 ENCOUNTER — Other Ambulatory Visit: Payer: Self-pay

## 2020-10-06 ENCOUNTER — Ambulatory Visit (INDEPENDENT_AMBULATORY_CARE_PROVIDER_SITE_OTHER): Payer: BC Managed Care – PPO

## 2020-10-06 DIAGNOSIS — J4550 Severe persistent asthma, uncomplicated: Secondary | ICD-10-CM | POA: Diagnosis not present

## 2020-10-22 ENCOUNTER — Other Ambulatory Visit: Payer: Self-pay

## 2020-10-22 ENCOUNTER — Ambulatory Visit (INDEPENDENT_AMBULATORY_CARE_PROVIDER_SITE_OTHER): Payer: BC Managed Care – PPO

## 2020-10-22 DIAGNOSIS — Z23 Encounter for immunization: Secondary | ICD-10-CM

## 2020-10-23 ENCOUNTER — Other Ambulatory Visit: Payer: Self-pay | Admitting: Allergy & Immunology

## 2020-10-23 ENCOUNTER — Other Ambulatory Visit: Payer: Self-pay | Admitting: Family Medicine

## 2020-10-25 NOTE — Progress Notes (Signed)
   Covid-19 Vaccination Clinic  Name:  ADONAI HELZER    MRN: [id number] DOB: 02-Sep-1966  10/25/2020  Ms. Rowen was observed post Covid-19 immunization for 30 minutes without incident. She was provided with Vaccine Information Sheet and instruction to access the V-Safe system.   Ms. Scheck was instructed to call 911 with any severe reactions post vaccine: Marland Kitchen Difficulty breathing  . Swelling of face and throat  . A fast heartbeat  . A bad rash all over body  . Dizziness and weakness   Immunizations Administered    No immunizations on file.

## 2020-11-30 ENCOUNTER — Other Ambulatory Visit: Payer: Self-pay | Admitting: Family Medicine

## 2020-12-08 ENCOUNTER — Other Ambulatory Visit: Payer: Self-pay

## 2020-12-08 ENCOUNTER — Ambulatory Visit (INDEPENDENT_AMBULATORY_CARE_PROVIDER_SITE_OTHER): Payer: BC Managed Care – PPO

## 2020-12-08 DIAGNOSIS — E785 Hyperlipidemia, unspecified: Secondary | ICD-10-CM | POA: Diagnosis not present

## 2020-12-08 DIAGNOSIS — J4550 Severe persistent asthma, uncomplicated: Secondary | ICD-10-CM | POA: Diagnosis not present

## 2020-12-08 DIAGNOSIS — I10 Essential (primary) hypertension: Secondary | ICD-10-CM | POA: Diagnosis not present

## 2020-12-09 LAB — CMP14+EGFR
ALT: 17 IU/L (ref 0–32)
AST: 16 IU/L (ref 0–40)
Albumin/Globulin Ratio: 1.7 (ref 1.2–2.2)
Albumin: 4.7 g/dL (ref 3.8–4.9)
Alkaline Phosphatase: 86 IU/L (ref 44–121)
BUN/Creatinine Ratio: 13 (ref 9–23)
BUN: 11 mg/dL (ref 6–24)
Bilirubin Total: 0.4 mg/dL (ref 0.0–1.2)
CO2: 22 mmol/L (ref 20–29)
Calcium: 9.9 mg/dL (ref 8.7–10.2)
Chloride: 102 mmol/L (ref 96–106)
Creatinine, Ser: 0.86 mg/dL (ref 0.57–1.00)
GFR calc Af Amer: 88 mL/min/{1.73_m2} (ref >59)
GFR calc non Af Amer: 76 mL/min/{1.73_m2} (ref >59)
Globulin, Total: 2.8 g/dL (ref 1.5–4.5)
Glucose: 104 mg/dL — ABNORMAL HIGH (ref 65–99)
Potassium: 4.1 mmol/L (ref 3.5–5.2)
Sodium: 142 mmol/L (ref 134–144)
Total Protein: 7.5 g/dL (ref 6.0–8.5)

## 2020-12-09 LAB — LIPID PANEL
Chol/HDL Ratio: 2.9 ratio (ref 0.0–4.4)
Cholesterol, Total: 156 mg/dL (ref 100–199)
HDL: 53 mg/dL (ref >39)
LDL Chol Calc (NIH): 90 mg/dL (ref 0–99)
Triglycerides: 67 mg/dL (ref 0–149)
VLDL Cholesterol Cal: 13 mg/dL (ref 5–40)

## 2020-12-16 ENCOUNTER — Encounter: Payer: Self-pay | Admitting: Family Medicine

## 2020-12-16 ENCOUNTER — Other Ambulatory Visit: Payer: Self-pay | Admitting: *Deleted

## 2020-12-16 ENCOUNTER — Telehealth (INDEPENDENT_AMBULATORY_CARE_PROVIDER_SITE_OTHER): Payer: BC Managed Care – PPO | Admitting: Family Medicine

## 2020-12-16 ENCOUNTER — Other Ambulatory Visit: Payer: Self-pay

## 2020-12-16 DIAGNOSIS — I10 Essential (primary) hypertension: Secondary | ICD-10-CM

## 2020-12-16 DIAGNOSIS — M25561 Pain in right knee: Secondary | ICD-10-CM | POA: Insufficient documentation

## 2020-12-16 DIAGNOSIS — J4540 Moderate persistent asthma, uncomplicated: Secondary | ICD-10-CM | POA: Diagnosis not present

## 2020-12-16 DIAGNOSIS — F5101 Primary insomnia: Secondary | ICD-10-CM

## 2020-12-16 DIAGNOSIS — J310 Chronic rhinitis: Secondary | ICD-10-CM

## 2020-12-16 MED ORDER — FASENRA 30 MG/ML ~~LOC~~ SOSY: 30.0000 mg | PREFILLED_SYRINGE | SUBCUTANEOUS | 8 refills | Status: DC

## 2020-12-16 MED ORDER — PREDNISONE 5 MG PO TABS: 5.0000 mg | ORAL_TABLET | Freq: Two times a day (BID) | ORAL | 0 refills | Status: AC

## 2020-12-16 NOTE — Assessment & Plan Note (Signed)
Controlled, no change in medication  

## 2020-12-16 NOTE — Patient Instructions (Addendum)
Follow-up in office with MD in early August,  call if you need me sooner.   Please call in July for labs needed for the visit.  Recent labs are excellent continue healthy habits which involve regular exercise and good food choices.  No change in medication at this time.  You are referred to orthopedics to manage the right knee pain that you are having.  5-day course of low-dose prednisone as prescribed for your nasal congestion.  Think about what you will eat, plan ahead. Choose " clean, green, fresh or frozen" over canned, processed or packaged foods which are more sugary, salty and fatty. 70 to 75% of food eaten should be vegetables and fruit. Three meals at set times with snacks allowed between meals, but they must be fruit or vegetables. Aim to eat over a 12 hour period , example 7 am to 7 pm, and STOP after  your last meal of the day. Drink water,generally about 64 ounces per day, no other drink is as healthy. Fruit juice is best enjoyed in a healthy way, by EATING the fruit. Thanks for choosing [facility], we consider it a privelige to serve you.

## 2020-12-16 NOTE — Assessment & Plan Note (Signed)
Sleep hygiene reviewed and written information offered also. Prescription sent for  medication needed.  

## 2020-12-16 NOTE — Assessment & Plan Note (Signed)
Current flare , 5 day course of prednisone

## 2020-12-16 NOTE — Assessment & Plan Note (Signed)
6 week history, no trauma , worsening, refer Ortho

## 2020-12-16 NOTE — Assessment & Plan Note (Signed)
DASH diet and commitment to daily physical activity for a minimum of 30 minutes discussed and encouraged, as a part of hypertension management. The importance of attaining a healthy weight is also discussed.  BP/Weight 09/24/2020 09/07/2020 08/24/2020 06/30/2020 05/20/2020 04/22/2020 01/13/4708  Systolic BP 628 366 294 765 465 035 465  Diastolic BP 80 74 81 71 80 80 80  Wt. (Lbs) - - 157 159 155 155 157.2  BMI - - 25.73 26.06 25.79 25.79 24.62

## 2020-12-16 NOTE — Progress Notes (Signed)
Virtual Visit via Telephone Note  I connected with Hannah Hays on 12/16/20 at  8:00 AM EST by telephone and verified that I am speaking with the correct person using two identifiers.  Location: Patient: home Provider: work   I discussed the limitations, risks, security and privacy concerns of performing an evaluation and management service by telephone and the availability of in person appointments. I also discussed with the patient that there may be a patient responsible charge related to this service. The patient expressed understanding and agreed to proceed.   History of Present Illness: Posterior left knee throbbing pain about 1 hour / day, almost every other day since Nov 25, pain rated at 10 Nasal congestion after excess smoke exposure oin the job x 4 days, no symptoms of infection Denies recent fever or chills. Denies sinus pressure,, ear pain or sore throat. Denies chest congestion, productive cough or wheezing. Denies chest pains, palpitations and leg swelling Denies abdominal pain, nausea, vomiting,diarrhea or constipation.   Denies dysuria, frequency, hesitancy or incontinence.  Denies headaches, seizures, numbness, or tingling. Denies depression, anxiety or insomnia. Denies skin break down or rash.       Observations/Objective: There were no vitals taken for this visit. Good communication with no confusion and intact memory. Alert and oriented x 3 No signs of respiratory distress during speech  Nasanl congestion audible  Assessment and Plan: HTN (hypertension) DASH diet and commitment to daily physical activity for a minimum of 30 minutes discussed and encouraged, as a part of hypertension management. The importance of attaining a healthy weight is also discussed.  BP/Weight 09/24/2020 09/07/2020 08/24/2020 06/30/2020 05/20/2020 04/22/2020 4/69/6295  Systolic BP 284 132 440 102 725 366 440  Diastolic BP 80 74 81 71 80 80 80  Wt. (Lbs) - - 157 159 155 155 157.2   BMI - - 25.73 26.06 25.79 25.79 24.62       Asthma Controlled, no change in medication   Chronic nonallergic rhinitis Current flare , 5 day course of prednisone  Insomnia Sleep hygiene reviewed and written information offered also. Prescription sent for  medication needed.   Migraine Controlled, no change in medication   Posterior right knee pain 6 week history, no trauma , worsening, refer Ortho    Follow Up Instructions:    I discussed the assessment and treatment plan with the patient. The patient was provided an opportunity to ask questions and all were answered. The patient agreed with the plan and demonstrated an understanding of the instructions.   The patient was advised to call back or seek an in-person evaluation if the symptoms worsen or if the condition fails to improve as anticipated.  I provided 20 minutes of non-face-to-face time during this encounter.   Tula Nakayama, MD

## 2020-12-24 ENCOUNTER — Ambulatory Visit: Payer: BC Managed Care – PPO | Admitting: Orthopedic Surgery

## 2020-12-28 ENCOUNTER — Ambulatory Visit (INDEPENDENT_AMBULATORY_CARE_PROVIDER_SITE_OTHER): Payer: BC Managed Care – PPO | Admitting: Orthopedic Surgery

## 2020-12-28 ENCOUNTER — Encounter: Payer: Self-pay | Admitting: Orthopedic Surgery

## 2020-12-28 ENCOUNTER — Ambulatory Visit: Payer: BC Managed Care – PPO

## 2020-12-28 ENCOUNTER — Other Ambulatory Visit: Payer: Self-pay

## 2020-12-28 ENCOUNTER — Other Ambulatory Visit: Payer: Self-pay | Admitting: Family Medicine

## 2020-12-28 VITALS — BP 132/71 | HR 60 | Ht 65.0 in | Wt 150.0 lb

## 2020-12-28 DIAGNOSIS — I10 Essential (primary) hypertension: Secondary | ICD-10-CM

## 2020-12-28 DIAGNOSIS — M7122 Synovial cyst of popliteal space [Baker], left knee: Secondary | ICD-10-CM | POA: Diagnosis not present

## 2020-12-28 DIAGNOSIS — G8929 Other chronic pain: Secondary | ICD-10-CM

## 2020-12-28 NOTE — Progress Notes (Signed)
New Patient Visit  Assessment: Hannah Hays is a 55 y.o. female with the following: Baker's cyst, left knee; minimal degenerative changes left knee.   Plan: Pain only in the back of the knee.  Cyst palpated on exam.  XR reviewed with the patient and demonstrates minimal degenerative changes.  Continue with activities as tolerated.  Pain medications as needed.  No further treatment needed.  Follow up PRN.   Follow-up: Return if symptoms worsen or fail to improve.  Subjective:  Chief Complaint  Patient presents with  . Knee Pain    Patient reports left knee x 4 months, denies any injury or fall.     History of Present Illness: Hannah Hays is a 55 y.o. female who has been referred to clinic today by Tula Nakayama, MD for evaluation of left knee pain.  The pain has been ongoing for the past 4 months.  No previous injury to her knee.  Pain is in the posterior knee.  It is not constant.  Occasional sharp pain in the back of the knee, 2-3 per week.  Usually resolves within 10-15 minutes.  She usually stops her activity or takes a tylenol or ibuprofen.  Pain is better by the time medication helps.  No swelling within the knee.     Review of Systems: No fevers or chills No numbness or tingling No chest pain No shortness of breath No bowel or bladder dysfunction No GI distress No headaches   Medical History:  Past Medical History:  Diagnosis Date  . Allergic rhinitis, seasonal   . Anxiety   . Asthma   . Depression   . GERD (gastroesophageal reflux disease)   . Helicobacter pylori gastritis 2009   s/p Prevpac treatment  . History of colonoscopy 2008   with simple adenoma  . Hypertension   . IBS (irritable bowel syndrome)    pre-dominant diarrhea  . Migraine headache 17   recurrence 1st time in 10 year in 2011    Past Surgical History:  Procedure Laterality Date  . ABDOMINAL HYSTERECTOMY    . CHOLECYSTECTOMY    . COLONOSCOPY  11/13/2007   SLF:A 4 mm sessile  transverse colon polyp/Random biopsies obtained throughout the colon/Otherwise normal. Negative for microscopic colitis, simple adenoma, due for surveillance 11/2017 per [facility]  . COLONOSCOPY N/A 02/02/2016   Procedure: COLONOSCOPY;  Surgeon: Danie Binder, MD;  [facility];  Service: Endoscopy;  Laterality: N/A;  12:15 PM  . ESOPHAGOGASTRODUODENOSCOPY  03/20/2008   SLF:A 1- to 2-cm hiatal hernia, mild erythema in the antrum/Normal esophagus without evidence of Barrett's, +H.pylori s/p Prevapc treatment   . ESOPHAGOGASTRODUODENOSCOPY (EGD) WITH ESOPHAGEAL DILATION N/A 02/14/2013   WUJ:WJXBJY Esophageal web/MILD Non-erosive gastritis (inflammation)  . PARTIAL HYSTERECTOMY  2002  . SEPTOPLASTY N/A 08/16/2015   Procedure: SEPTOPLASTY;  Surgeon: Leta Baptist, MD;  Location: [facility];  Service: ENT;  Laterality: N/A;  . SINUS ENDO WITH FUSION Bilateral 08/16/2015   Procedure: ETHMOIDECTOMY, SPHENOIDECTOMY, MAXILLARY ANTROSTOMY, FRONTAL RECESS EXPLORATION WITH FUSION NAVIGATION;  Surgeon: Leta Baptist, MD;  Location: [facility];  Service: ENT;  Laterality: Bilateral;  . TUBAL LIGATION  1995    Family History  Problem Relation Age of Onset  . Hypertension Mother   . Heart disease Mother   . Diabetes Mother   . Alcohol abuse Father   . Hypertension Father   . Stroke Father   . Hypertension Sister   . Kidney disease Sister   .  Diabetes Sister   . Hypertension Sister   . Diabetes Sister   . Diabetes Brother   . Heart disease Brother   . Hypertension Brother   . Colon cancer Neg Hx    Social History   Tobacco Use  . Smoking status: Never Smoker  . Smokeless tobacco: Never Used  Substance Use Topics  . Alcohol use: No  . Drug use: No    Allergies  Allergen Reactions  . Bacid Rash    Current Meds  Medication Sig  . albuterol (PROAIR HFA) 108 (90 Base) MCG/ACT inhaler Inhale 2 puffs into the lungs every 4 (four) hours as needed for wheezing or shortness  of breath.  Marland Kitchen amLODipine (NORVASC) 10 MG tablet TAKE 1 TABLET(10 MG) BY MOUTH DAILY  . Beclomethasone Dipropionate (QNASL) 80 MCG/ACT AERS Place 2 sprays into both nostrils 2 (two) times daily.  . Benralizumab (FASENRA) 30 MG/ML SOSY Inject 1 mL (30 mg total) into the skin every 8 (eight) weeks. Dx code J45.50  . BREZTRI AEROSPHERE 160-9-4.8 MCG/ACT AERO Inhale 2 puffs into the lungs 2 (two) times daily.  . Calcium Carbonate-Vitamin D (CALCIUM 600 + D PO) Take 2 tablets by mouth at bedtime.   Marland Kitchen EPINEPHrine 0.3 mg/0.3 mL IJ SOAJ injection Inject 0.3 mg once into the muscle.  . estradiol (CLIMARA - DOSED IN MG/24 HR) 0.025 mg/24hr patch APPLY 1 PATCH EXTERNALLY TO THE SKIN EVERY WEEK IN THE EVENING FOR 7 DAYS  . fluticasone (FLONASE) 50 MCG/ACT nasal spray SHAKE LIQUID AND USE 2 SPRAYS IN EACH NOSTRIL DAILY  . ibuprofen (ADVIL) 800 MG tablet Take 1 tablet (800 mg total) by mouth every 8 (eight) hours as needed for headache.  . mirtazapine (REMERON) 15 MG tablet TAKE 1/2 TABLET(7.5 MG) BY MOUTH AT BEDTIME  . montelukast (SINGULAIR) 10 MG tablet Take 1 tablet (10 mg total) by mouth at bedtime.  . Multiple Vitamin (MULTIVITAMIN WITH MINERALS) TABS Take 1 tablet by mouth at bedtime.   . rizatriptan (MAXALT) 5 MG tablet Take 1 tablet (5 mg total) by mouth as needed for migraine. May repeat in 2 hours if needed  . rosuvastatin (CRESTOR) 20 MG tablet TAKE 1 TABLET(20 MG) BY MOUTH DAILY  . SPIRIVA RESPIMAT 1.25 MCG/ACT AERS INHALE 2 PUFFS INTO THE LUNGS DAILY  . spironolactone (ALDACTONE) 25 MG tablet TAKE 1 TABLET(25 MG) BY MOUTH DAILY   Current Facility-Administered Medications for the 12/28/20 encounter (Office Visit) with Mordecai Rasmussen, MD  Medication  . Benralizumab SOSY 30 mg    Objective: BP 132/71   Pulse 60   Ht 5\' 5"  (1.651 m)   Wt 150 lb (68 kg)   BMI 24.96 kg/m   Physical Exam:  General: Alert and oriented, no acute distress Gait: Normal gait  Left knee without effusion.  Full  ROM without pain. Small cyst in posterior knee, mild tenderness to palpation.  Palpating the cyst recreates the pain.  Negative Lachman.  No tenderness to medial or lateral joint line.      IMAGING: I personally ordered and reviewed the following images   XR left knee demonstrates well maintained joint space laterally.  Mild loss of joint space medially.  Sclerotic medial plateau.  Minimal osteophytes.  Impression: Mild degenerative changes left knee.    New Medications:  No orders of the defined types were placed in this encounter.     Mordecai Rasmussen, MD  12/28/2020 8:56 AM

## 2020-12-28 NOTE — Patient Instructions (Signed)
Baker Cyst  A Baker cyst, also called a popliteal cyst, is a growth that forms at the back of the knee. The cyst forms when the fluid-filled sac (bursa) that cushions the knee joint becomes enlarged. What are the causes? In most cases, a Baker cyst results from another knee problem that causes swelling inside the knee. This makes the fluid inside the knee joint (synovial fluid) flow into the bursa behind the knee, causing the bursa to enlarge. What increases the risk? You may be more likely to develop a Baker cyst if you already have a knee problem, such as:  A tear in cartilage that cushions the knee joint (meniscal tear).  A tear in the tissues that connect the bones of the knee joint (ligament tear).  Knee swelling from osteoarthritis, rheumatoid arthritis, or gout. What are the signs or symptoms? The main symptom of this condition is a lump behind the knee. This may be the only symptom of the condition. The lump may be painful, especially when the knee is straightened. If the lump is painful, the pain may come and go. The knee may also be stiff. Symptoms may quickly get more severe if the cyst breaks open (ruptures). If the cyst ruptures, you may feel the following in your knee and calf:  Sudden or worsening pain.  Swelling.  Bruising.  Redness in the calf. A Baker cyst does not always cause symptoms. How is this diagnosed? This condition may be diagnosed based on your symptoms and medical history. Your health care provider will also do a physical exam. This may include:  Feeling the cyst to check whether it is tender.  Checking your knee for signs of another knee condition that causes swelling. You may have imaging tests, such as:  X-rays.  MRI.  Ultrasound. How is this treated? A Baker cyst that is not painful may go away without treatment. If the cyst gets large or painful, it will likely get better if the underlying knee problem is treated. If needed, treatment for a  Baker cyst may include:  Resting.  Keeping weight off of the knee. This means not leaning on the knee to support your body weight.  Taking NSAIDs, such as ibuprofen, to reduce pain and swelling.  Having a procedure to drain the fluid from the cyst with a needle (aspiration). You may also get an injection of a medicine that reduces swelling (steroid).  Having surgery. This may be needed if other treatments do not work. This usually involves correcting knee damage and removing the cyst. Follow these instructions at home: Activity  Rest as told by your health care provider.  Avoid activities that make pain or swelling worse.  Return to your normal activities as told by your health care provider. Ask your health care provider what activities are safe for you.  Do not use the injured limb to support your body weight until your health care provider says that you can. Use crutches as told by your health care provider. General instructions  Take over-the-counter and prescription medicines only as told by your health care provider.  Keep all follow-up visits as told by your health care provider. This is important.   Contact a health care provider if:  You have knee pain, stiffness, or swelling that does not get better. Get help right away if:  You have sudden or worsening pain and swelling in your calf area. Summary  A Baker cyst, also called a popliteal cyst, is a growth that forms at   the back of the knee.  In most cases, a Baker cyst results from another knee problem that causes swelling inside the knee.  A Baker cyst that is not painful may go away without treatment.  If needed, treatment for a Baker cyst may include resting, keeping weight off of the knee, medicines, or draining fluid from the cyst.  Surgery may be needed if other treatments are not effective. This information is not intended to replace advice given to you by your health care provider. Make sure you discuss any  questions you have with your health care provider. Document Revised: 04/04/2019 Document Reviewed: 04/04/2019 Elsevier Patient Education  2021 Elsevier Inc.  

## 2021-01-25 ENCOUNTER — Other Ambulatory Visit: Payer: Self-pay | Admitting: Family Medicine

## 2021-02-02 ENCOUNTER — Other Ambulatory Visit: Payer: Self-pay

## 2021-02-02 ENCOUNTER — Ambulatory Visit (INDEPENDENT_AMBULATORY_CARE_PROVIDER_SITE_OTHER): Payer: BC Managed Care – PPO

## 2021-02-02 DIAGNOSIS — J4550 Severe persistent asthma, uncomplicated: Secondary | ICD-10-CM

## 2021-03-25 ENCOUNTER — Ambulatory Visit: Payer: BC Managed Care – PPO | Admitting: Allergy & Immunology

## 2021-03-26 ENCOUNTER — Other Ambulatory Visit: Payer: Self-pay | Admitting: Family Medicine

## 2021-03-26 DIAGNOSIS — I10 Essential (primary) hypertension: Secondary | ICD-10-CM

## 2021-03-30 ENCOUNTER — Ambulatory Visit: Payer: Self-pay

## 2021-04-06 ENCOUNTER — Other Ambulatory Visit: Payer: Self-pay

## 2021-04-06 ENCOUNTER — Encounter: Payer: Self-pay | Admitting: Allergy & Immunology

## 2021-04-06 ENCOUNTER — Ambulatory Visit (INDEPENDENT_AMBULATORY_CARE_PROVIDER_SITE_OTHER): Payer: BC Managed Care – PPO | Admitting: Allergy & Immunology

## 2021-04-06 VITALS — BP 122/70 | HR 68 | Temp 98.3°F | Resp 16 | Ht 65.25 in | Wt 160.0 lb

## 2021-04-06 DIAGNOSIS — J3089 Other allergic rhinitis: Secondary | ICD-10-CM | POA: Diagnosis not present

## 2021-04-06 DIAGNOSIS — J4550 Severe persistent asthma, uncomplicated: Secondary | ICD-10-CM | POA: Diagnosis not present

## 2021-04-06 MED ORDER — MONTELUKAST SODIUM 10 MG PO TABS: 10.0000 mg | ORAL_TABLET | Freq: Every day | ORAL | 5 refills | Status: DC

## 2021-04-06 MED ORDER — BREZTRI AEROSPHERE 160-9-4.8 MCG/ACT IN AERO: 2.0000 | INHALATION_SPRAY | Freq: Two times a day (BID) | RESPIRATORY_TRACT | 5 refills | Status: DC

## 2021-04-06 MED ORDER — ALBUTEROL SULFATE HFA 108 (90 BASE) MCG/ACT IN AERS: 2.0000 | INHALATION_SPRAY | RESPIRATORY_TRACT | 1 refills | Status: DC | PRN

## 2021-04-06 MED ORDER — SPIRIVA RESPIMAT 1.25 MCG/ACT IN AERS: 2.0000 | INHALATION_SPRAY | Freq: Every day | RESPIRATORY_TRACT | 5 refills | Status: DC

## 2021-04-06 NOTE — Patient Instructions (Addendum)
1. Moderate persistent asthma, uncomplicated - Lung testing was in the 60% range today, but it did improve with the albuterol.  - We are not going to make any changes today. - Steroid pack provided to start in case symptoms worsen.  - Daily controller medication(s): Breztri 1-2 puffs twice daily with spacer + Singulair 10mg  daily + Fasenra every 8 weeks - Rescue medications: ProAir 4 puffs every 4-6 hours as needed - Asthma control goals:  * Full participation in all desired activities (may need albuterol before activity) * Albuterol use two time or less a week on average (not counting use with activity) * Cough interfering with sleep two time or less a month * Oral steroids no more than once a year * No hospitalizations  2. Chronic allergic rhinitis  - Continue with Qnasl 64mcg two puffs once daily as needed.  - Continue with nasal saline rinses 1-2 times daily.  - Continue with Singulair 10mg  daily.  3. Return in about 6 months (around 10/07/2021).    Please inform us of any Emergency Department visits, hospitalizations, or changes in symptoms. Call us before going to the ED for breathing or allergy symptoms since we might be able to fit you in for a sick visit. Feel free to contact us anytime with any questions, problems, or concerns.  It was a pleasure to see you again today!  Websites that have reliable patient information: 1. American Academy of Asthma, Allergy, and Immunology: www.aaaai.org 2. Food Allergy Research and Education (FARE): foodallergy.org 3. Mothers of Asthmatics: http://www.asthmacommunitynetwork.org 4. American College of Allergy, Asthma, and Immunology: www.acaai.org   COVID-19 Vaccine Information can be found at: ShippingScam.co.uk For questions related to vaccine distribution or appointments, please email [email or web address] or call [phone].   We realize that you might be concerned about  having an allergic reaction to the COVID19 vaccines. To help with that concern, WE ARE OFFERING THE COVID19 VACCINES IN OUR OFFICE! Ask the front desk for dates!     "Like" [place] on Facebook and Instagram for our latest updates!      A healthy democracy works best when [employer] participate! Make sure you are registered to vote! If you have moved or changed any of your contact information, you will need to get this updated before voting!  In some cases, you MAY be able to register to vote online: [email or web address]

## 2021-04-06 NOTE — Progress Notes (Signed)
FOLLOW UP  Date of Service/Encounter:  04/06/21   Assessment:   Moderate persistent asthma, uncomplicated - with possible VCD component  Perennial allergic rhinitis  Adverse food reaction  Plan/Recommendations:   1. Moderate persistent asthma, uncomplicated - Lung testing was in the 60% range today, but it did improve with the albuterol.  - We are not going to make any changes today. - Steroid pack provided to start in case symptoms worsen.  - Daily controller medication(s): Breztri 1-2 puffs twice daily with spacer + Singulair 10mg  daily + Fasenra every 8 weeks - Rescue medications: ProAir 4 puffs every 4-6 hours as needed - Asthma control goals:  * Full participation in all desired activities (may need albuterol before activity) * Albuterol use two time or less a week on average (not counting use with activity) * Cough interfering with sleep two time or less a month * Oral steroids no more than once a year * No hospitalizations  2. Chronic allergic rhinitis  - Continue with Qnasl 26mcg two puffs once daily as needed.  - Continue with nasal saline rinses 1-2 times daily.  - Continue with Singulair 10mg  daily.  3. Return in about 6 months (around 10/07/2021).    Subjective:   Hannah Hays is a 55 y.o. female presenting today for follow up of  Chief Complaint  Patient presents with  . Asthma    Currently having a flare up. Says she has been doing well past few months.     Hannah Hays has a history of the following: Patient Active Problem List   Diagnosis Date Noted  . Posterior right knee pain 12/16/2020  . Overweight (BMI 25.0-29.9) 06/30/2020  . Insomnia 08/07/2018  . Hyperlipidemia LDL goal <100 10/16/2017  . Perennial allergic rhinitis 08/28/2017  . Moderate persistent asthma, uncomplicated 12/12/3233  . Hx of adenomatous colonic polyps   . Lipoma 12/26/2015  . Liver mass, left lobe 02/11/2015  . Nasal polyposis 08/20/2014  . Chronic nonallergic  rhinitis 08/20/2014  . Vitamin D deficiency 07/12/2012  . HTN (hypertension) 08/19/2011  . GERD (gastroesophageal reflux disease) 04/11/2011  . IBS 12/19/2010  . Migraine 01/19/2010  . IGT (impaired glucose tolerance) 12/26/2009  . Asthma 11/15/2007    History obtained from: chart review and patient.  Hannah Hays is a 55 y.o. female presenting for a follow up visit.  Asthma/Respiratory Symptom History: She remains on the Breztri two puffs twice daily. Fasenra injections were stopped because her insurance was messing up.  Hannah Hays's asthma has been well controlled. She has not required rescue medication, experienced nocturnal awakenings due to lower respiratory symptoms, nor have activities of daily living been limited. She has required no Emergency Department or Urgent Care visits for her asthma. She has required zero courses of systemic steroids for asthma exacerbations since the last visit. ACT score today is 24, indicating excellent asthma symptom control.   Allergic Rhinitis Symptom History: She is using Qnasl only as needed. She reports that there is some nose burning wiht this. She does not use it every day in order to tolerate it better. She is still on the Singulair.   She is going to get her 4th booster shot. She has convinced her daughter to get the injection as well.   Otherwise, there have been no changes to her past medical history, surgical history, family history, or social history.    Review of Systems  Constitutional: Negative.  Negative for chills, fever, malaise/fatigue and weight loss.  HENT: Negative  for congestion, ear discharge, ear pain and sinus pain.   Eyes: Negative for pain, discharge and redness.  Respiratory: Positive for cough and shortness of breath. Negative for sputum production and wheezing.   Cardiovascular: Negative.  Negative for chest pain and palpitations.  Gastrointestinal: Negative for abdominal pain, constipation, diarrhea, heartburn, nausea and  vomiting.  Skin: Negative.  Negative for itching and rash.  Neurological: Negative for dizziness and headaches.  Endo/Heme/Allergies: Positive for environmental allergies. Does not bruise/bleed easily.       Objective:   Blood pressure 122/70, pulse 68, temperature 98.3 F (36.8 C), resp. rate 16, height 5' 5.25" (1.657 m), weight 160 lb (72.6 kg), SpO2 98 %. Body mass index is 26.42 kg/m.   Physical Exam:  Physical Exam Constitutional:      Appearance: She is well-developed.     Comments: Pleasant female. Cooperative with the exam.   HENT:     Head: Normocephalic and atraumatic.     Right Ear: Tympanic membrane, ear canal and external ear normal.     Left Ear: Tympanic membrane, ear canal and external ear normal.     Nose: No nasal deformity, septal deviation, mucosal edema or rhinorrhea.     Right Turbinates: Enlarged and swollen.     Left Turbinates: Enlarged and swollen.     Right Sinus: No maxillary sinus tenderness or frontal sinus tenderness.     Left Sinus: No maxillary sinus tenderness or frontal sinus tenderness.     Mouth/Throat:     Lips: Pink.     Mouth: Mucous membranes are moist. Mucous membranes are not pale and not dry.     Pharynx: Uvula midline.  Eyes:     General:        Right eye: No discharge.        Left eye: No discharge.     Conjunctiva/sclera: Conjunctivae normal.     Right eye: Right conjunctiva is not injected. No chemosis.    Left eye: Left conjunctiva is not injected. No chemosis.    Pupils: Pupils are equal, round, and reactive to light.  Cardiovascular:     Rate and Rhythm: Normal rate and regular rhythm.     Heart sounds: Normal heart sounds.  Pulmonary:     Effort: Pulmonary effort is normal. No tachypnea, accessory muscle usage or respiratory distress.     Breath sounds: Normal breath sounds. No wheezing, rhonchi or rales.     Comments: Moving air well in all lung fields. No increased work of breathing.  Chest:     Chest wall: No  tenderness.  Lymphadenopathy:     Cervical: No cervical adenopathy.  Skin:    General: Skin is warm.     Capillary Refill: Capillary refill takes less than 2 seconds.     Coloration: Skin is not pale.     Findings: No abrasion, erythema, petechiae or rash. Rash is not papular, urticarial or vesicular.  Neurological:     Mental Status: She is alert.  Psychiatric:        Behavior: Behavior is cooperative.      Diagnostic studies:    Spirometry: results abnormal (FEV1: 1.25/53%, FVC: 2.08/71%, FEV1/FVC: 60%).    Spirometry consistent with mixed obstructive and restrictive disease. Albuterol four puffs via MDI treatment given in clinic with significant improvement in FEV1 and FVC per ATS criteria. Her spirometry normalized following the albuterol treatment. Possible VCD?   Allergy Studies: none       [name], MD  [facility]

## 2021-05-12 ENCOUNTER — Telehealth: Payer: Self-pay | Admitting: Internal Medicine

## 2021-05-12 NOTE — Telephone Encounter (Signed)
noted 

## 2021-05-12 NOTE — Telephone Encounter (Signed)
Nurse visit made

## 2021-05-12 NOTE — Telephone Encounter (Signed)
Hannah Hays, please schedule nurse visit.

## 2021-05-12 NOTE — Telephone Encounter (Signed)
Pt has a history of tubular adenomas in 2008. Per Dr.Fields 2017 colonoscopy report she needed to repeat it in 5-10 years.   Dr.Carver, would it be appropriate for her to repeat her colonoscopy this year?

## 2021-05-12 NOTE — Telephone Encounter (Signed)
Pt called to see when her next colonoscopy was due. Dr Oneida Alar did her last one in 2017 and said next one in 5-10 years. I didn't see any other notes stating which time she needed another procedure. Please advise if she is due now and if she needs NV or OV, or is she due in 5 more years.

## 2021-05-18 ENCOUNTER — Other Ambulatory Visit: Payer: Self-pay

## 2021-05-18 ENCOUNTER — Ambulatory Visit (INDEPENDENT_AMBULATORY_CARE_PROVIDER_SITE_OTHER): Payer: BC Managed Care – PPO

## 2021-05-18 ENCOUNTER — Ambulatory Visit: Payer: Self-pay

## 2021-05-18 DIAGNOSIS — J4550 Severe persistent asthma, uncomplicated: Secondary | ICD-10-CM

## 2021-05-30 ENCOUNTER — Other Ambulatory Visit: Payer: Self-pay

## 2021-05-30 ENCOUNTER — Ambulatory Visit (INDEPENDENT_AMBULATORY_CARE_PROVIDER_SITE_OTHER): Payer: Self-pay | Admitting: *Deleted

## 2021-05-30 VITALS — Ht 65.0 in | Wt 154.4 lb

## 2021-05-30 DIAGNOSIS — Z1211 Encounter for screening for malignant neoplasm of colon: Secondary | ICD-10-CM

## 2021-05-30 MED ORDER — PEG 3350-KCL-NA BICARB-NACL 420 G PO SOLR: 4000.0000 mL | Freq: Once | ORAL | 0 refills | Status: AC

## 2021-05-30 NOTE — Progress Notes (Addendum)
Gastroenterology Pre-Procedure Review  Request Date: 05/30/2021 Requesting Physician: 5 year recall, Last TCS done 02/02/2016 by Dr. Oneida Alar, internal hemorrhoids  PATIENT REVIEW QUESTIONS: The patient responded to the following health history questions as indicated:    1. Diabetes Melitis: no 2. Joint replacements in the past 12 months: no 3. Major health problems in the past 3 months: no 4. Has an artificial valve or MVP: no 5. Has a defibrillator: no 6. Has been advised in past to take antibiotics in advance of a procedure like teeth cleaning: no 7. Family history of colon cancer: no  8. Alcohol Use: no 9. Illicit drug Use: no 10. History of sleep apnea: no  11. History of coronary artery or other vascular stents placed within the last 12 months: no 12. History of any prior anesthesia complications: no 13. Body mass index is 25.69 kg/m.    MEDICATIONS & ALLERGIES:    Patient reports the following regarding taking any blood thinners:   Plavix? no Aspirin? no Coumadin? no Brilinta? no Xarelto? no Eliquis? no Pradaxa? no Savaysa? no Effient? no  Patient confirms/reports the following medications:  Current Outpatient Medications  Medication Sig Dispense Refill   albuterol (PROAIR HFA) 108 (90 Base) MCG/ACT inhaler Inhale 2 puffs into the lungs every 4 (four) hours as needed for wheezing or shortness of breath. 18 g 1   amLODipine (NORVASC) 10 MG tablet TAKE 1 TABLET(10 MG) BY MOUTH DAILY 90 tablet 0   Benralizumab (FASENRA) 30 MG/ML SOSY Inject 1 mL (30 mg total) into the skin every 8 (eight) weeks. Dx code J45.50 1 mL 8   BREZTRI AEROSPHERE 160-9-4.8 MCG/ACT AERO Inhale 2 puffs into the lungs 2 (two) times daily. 10.7 g 5   Calcium Carbonate-Vitamin D (CALCIUM 600 + D PO) Take 2 tablets by mouth at bedtime.      EPINEPHrine 0.3 mg/0.3 mL IJ SOAJ injection Inject 0.3 mg once into the muscle.     fluticasone (FLONASE) 50 MCG/ACT nasal spray SHAKE LIQUID AND USE 2 SPRAYS IN EACH  NOSTRIL DAILY (Patient taking differently: as needed.) 16 g 3   ibuprofen (ADVIL) 800 MG tablet Take 1 tablet (800 mg total) by mouth every 8 (eight) hours as needed for headache. 20 tablet 1   mirtazapine (REMERON) 15 MG tablet TAKE 1/2 TABLET BY MOUTH AT BEDTIME FOR SLEEP (Patient taking differently: as needed. TAKE 1/2 TABLET BY MOUTH AT BEDTIME FOR SLEEP) 45 tablet 1   montelukast (SINGULAIR) 10 MG tablet Take 1 tablet (10 mg total) by mouth at bedtime. 30 tablet 5   Multiple Vitamin (MULTIVITAMIN WITH MINERALS) TABS Take 1 tablet by mouth at bedtime.      rizatriptan (MAXALT) 5 MG tablet Take 1 tablet (5 mg total) by mouth as needed for migraine. May repeat in 2 hours if needed 10 tablet 0   rosuvastatin (CRESTOR) 20 MG tablet TAKE 1 TABLET(20 MG) BY MOUTH DAILY 90 tablet 0   spironolactone (ALDACTONE) 25 MG tablet TAKE 1 TABLET(25 MG) BY MOUTH DAILY 30 tablet 0   Current Facility-Administered Medications  Medication Dose Route Frequency Provider Last Rate Last Admin   Benralizumab SOSY 30 mg  30 mg Subcutaneous Q28 days Valentina Shaggy, MD   30 mg at 05/18/21 0932    Patient confirms/reports the following allergies:  Allergies  Allergen Reactions   Bacid Rash    No orders of the defined types were placed in this encounter.   AUTHORIZATION INFORMATION Primary Insurance: [place],  [place] #: [id number],  Group #: [id number] Pre-Cert / Josem Kaufmann required: No, not required per Silver Huguenin / Auth #: Ref#: [id number]  Secondary Insurance: Lorella Nimrod,  [place] #: [id number],  Group #: [id number] Pre-Cert / Auth required: No, not required per Barton Dubois / Auth #: Ref#: [id number]  SCHEDULE INFORMATION: Procedure has been scheduled as follows:  Date: 06/14/2021, Time: 12:45  Location: [facility] with Dr. Abbey Chatters  This Gastroenterology Pre-Precedure Review Form is being routed to the following provider(s): Neil Crouch, PA-C

## 2021-05-30 NOTE — Patient Instructions (Signed)
Hannah Hays   17-Sep-1966 MRN: [id number]    Procedure Date: 06/14/2021 Time to register: 11:15 am Place to register: [place] [facility] Scheduled provider: Dr. Abbey Chatters  PREPARATION FOR COLONOSCOPY WITH TRI-LYTE SPLIT PREP  Please notify us immediately if you are diabetic, take iron supplements, or if you are on Coumadin or any other blood thinners.   Please hold the following medications: n/a  You will need to purchase 1 fleet enema and 1 box of Bisacodyl 56m tablets.   2 DAYS BEFORE PROCEDURE:  DATE: 06/12/2021   DAY: Sunday Begin clear liquid diet AFTER your lunch meal. NO SOLID FOODS after this point.  1 DAY BEFORE PROCEDURE:  DATE: 06/13/2021   DAY: Monday Continue clear liquids the entire day - NO SOLID FOOD.   Diabetic medications adjustments for today: n/a  At 2:00 pm:  Take 2 Bisacodyl tablets.   At 4:00pm:  Start drinking your solution. Make sure you mix well per instructions on the bottle. Try to drink 1 (one) 8 ounce glass every 10-15 minutes until you have consumed HALF the jug. You should complete by 6:00pm.You must keep the left over solution refrigerated until completed next day.  Continue clear liquids. You must drink plenty of clear liquids to prevent dehyration and kidney failure.     DAY OF PROCEDURE:   DATE: 06/14/2021   DAY: Tuesday If you take medications for your heart, blood pressure or breathing, you may take these medications.  Diabetic medications adjustments for today: n/a  Five hours before your procedure time @ 7:45 am:  Finish remaining amout of bowel prep, drinking 1 (one) 8 ounce glass every 10-15 minutes until complete. You have two hours to consume remaining prep.   Three hours before your procedure time @ 9:45 am:  Nothing by mouth.   At least one hour before going to the hospital:  Give yourself one Fleet enema. You may take your morning medications with sip of water unless we have instructed otherwise.      Please see below for  Dietary Information.  CLEAR LIQUIDS INCLUDE:  Water Jello (NOT red in color)   Ice Popsicles (NOT red in color)   Tea (sugar ok, no milk/cream) Powdered fruit flavored drinks  Coffee (sugar ok, no milk/cream) Gatorade/ Lemonade/ Kool-Aid  (NOT red in color)   Juice: apple, white grape, white cranberry Soft drinks  Clear bullion, consomme, broth (fat free beef/chicken/vegetable)  Carbonated beverages (any kind)  Strained chicken noodle soup Hard Candy   Remember: Clear liquids are liquids that will allow you to see your fingers on the other side of a clear glass. Be sure liquids are NOT red in color, and not cloudy, but CLEAR.  DO NOT EAT OR DRINK ANY OF THE FOLLOWING:  Dairy products of any kind   Cranberry juice Tomato juice / V8 juice   Grapefruit juice Orange juice     Red grape juice  Do not eat any solid foods, including such foods as: cereal, oatmeal, yogurt, fruits, vegetables, creamed soups, eggs, bread, crackers, pureed foods in a blender, etc.   HELPFUL HINTS FOR DRINKING PREP SOLUTION:  Make sure prep is extremely cold. Mix and refrigerate the the morning of the prep. You may also put in the freezer.  You may try mixing some Crystal Light or Country Time Lemonade if you prefer. Mix in small amounts; add more if necessary. Try drinking through a straw Rinse mouth with water or a mouthwash between glasses, to remove after-taste.  Try sipping on a cold beverage /ice/ popsicles between glasses of prep. Place a piece of sugar-free hard candy in mouth between glasses. If you become nauseated, try consuming smaller amounts, or stretch out the time between glasses. Stop for 30-60 minutes, then slowly start back drinking.        OTHER INSTRUCTIONS  You will need a responsible adult at least 56 years of age to accompany you and drive you home. This person must remain in the waiting room during your procedure. The hospital will cancel your procedure if you do not have a  responsible adult with you.   Wear loose fitting clothing that is easily removed. Leave jewelry and other valuables at home.  Remove all body piercing jewelry and leave at home. Total time from sign-in until discharge is approximately 2-3 hours. You should go home directly after your procedure and rest. You can resume normal activities the day after your procedure. The day of your procedure you should not: Drive Make legal decisions Operate machinery Drink alcohol Return to work   You may call the office (Dept: [phone]) before 5:00pm, or page the doctor on call [phone]) after 5:00pm, for further instructions, if necessary.   Insurance Information YOU WILL NEED TO CHECK WITH YOUR INSURANCE COMPANY FOR THE BENEFITS OF COVERAGE YOU HAVE FOR THIS PROCEDURE.  UNFORTUNATELY, NOT ALL INSURANCE COMPANIES HAVE BENEFITS TO COVER ALL OR PART OF THESE TYPES OF PROCEDURES.  IT IS YOUR RESPONSIBILITY TO CHECK YOUR BENEFITS, HOWEVER, WE WILL BE GLAD TO ASSIST YOU WITH ANY CODES YOUR INSURANCE COMPANY MAY NEED.    PLEASE NOTE THAT MOST INSURANCE COMPANIES WILL NOT COVER A SCREENING COLONOSCOPY FOR PEOPLE UNDER THE AGE OF 50  IF YOU HAVE BCBS INSURANCE, YOU MAY HAVE BENEFITS FOR A SCREENING COLONOSCOPY BUT IF POLYPS ARE FOUND THE DIAGNOSIS WILL CHANGE AND THEN YOU MAY HAVE A DEDUCTIBLE THAT WILL NEED TO BE MET. SO PLEASE MAKE SURE YOU CHECK YOUR BENEFITS FOR A SCREENING COLONOSCOPY AS WELL AS A DIAGNOSTIC COLONOSCOPY.

## 2021-05-30 NOTE — Progress Notes (Signed)
Ok to schedule.  ASA II 

## 2021-05-31 ENCOUNTER — Other Ambulatory Visit: Payer: Self-pay | Admitting: *Deleted

## 2021-05-31 DIAGNOSIS — Z139 Encounter for screening, unspecified: Secondary | ICD-10-CM

## 2021-06-02 ENCOUNTER — Other Ambulatory Visit: Payer: Self-pay | Admitting: *Deleted

## 2021-06-10 ENCOUNTER — Other Ambulatory Visit (HOSPITAL_COMMUNITY)
Admission: RE | Admit: 2021-06-10 | Discharge: 2021-06-10 | Disposition: A | Discharge status: Home / Self Care | Payer: BC Managed Care – PPO | Source: Ambulatory Visit | Attending: Internal Medicine | Admitting: Internal Medicine

## 2021-06-10 ENCOUNTER — Other Ambulatory Visit: Payer: Self-pay

## 2021-06-10 DIAGNOSIS — Z139 Encounter for screening, unspecified: Secondary | ICD-10-CM | POA: Diagnosis not present

## 2021-06-10 LAB — BASIC METABOLIC PANEL
Anion gap: 9 (ref 5–15)
BUN: 16 mg/dL (ref 6–20)
CO2: 28 mmol/L (ref 22–32)
Calcium: 9.5 mg/dL (ref 8.9–10.3)
Chloride: 104 mmol/L (ref 98–111)
Creatinine, Ser: 1.07 mg/dL — ABNORMAL HIGH (ref 0.44–1.00)
GFR, Estimated: >60 mL/min (ref >60)
Glucose, Bld: 106 mg/dL — ABNORMAL HIGH (ref 70–99)
Potassium: 3.9 mmol/L (ref 3.5–5.1)
Sodium: 141 mmol/L (ref 135–145)

## 2021-06-13 ENCOUNTER — Telehealth: Payer: Self-pay | Admitting: Internal Medicine

## 2021-06-13 NOTE — Telephone Encounter (Signed)
Lmom for pt to call me back. 

## 2021-06-13 NOTE — Telephone Encounter (Signed)
[phone] please call patient, they changed her procedure time and she has a question about when to start her prep

## 2021-06-14 ENCOUNTER — Ambulatory Visit (HOSPITAL_COMMUNITY)
Admission: RE | Admit: 2021-06-14 | Discharge: 2021-06-14 | Disposition: A | Discharge status: Home / Self Care | Payer: BC Managed Care – PPO | Attending: Internal Medicine | Admitting: Internal Medicine

## 2021-06-14 ENCOUNTER — Encounter (HOSPITAL_COMMUNITY)
Admission: RE | Disposition: A | Discharge status: Home / Self Care | Payer: Self-pay | Source: Home / Self Care | Attending: Internal Medicine

## 2021-06-14 ENCOUNTER — Other Ambulatory Visit: Payer: Self-pay

## 2021-06-14 ENCOUNTER — Ambulatory Visit (HOSPITAL_COMMUNITY): Payer: BC Managed Care – PPO | Admitting: Anesthesiology

## 2021-06-14 ENCOUNTER — Encounter (HOSPITAL_COMMUNITY): Payer: Self-pay

## 2021-06-14 DIAGNOSIS — K648 Other hemorrhoids: Secondary | ICD-10-CM | POA: Diagnosis not present

## 2021-06-14 DIAGNOSIS — Z86010 Personal history of colonic polyps: Secondary | ICD-10-CM | POA: Diagnosis not present

## 2021-06-14 DIAGNOSIS — J4540 Moderate persistent asthma, uncomplicated: Secondary | ICD-10-CM | POA: Diagnosis not present

## 2021-06-14 DIAGNOSIS — Z79899 Other long term (current) drug therapy: Secondary | ICD-10-CM | POA: Insufficient documentation

## 2021-06-14 DIAGNOSIS — Z1211 Encounter for screening for malignant neoplasm of colon: Secondary | ICD-10-CM | POA: Insufficient documentation

## 2021-06-14 DIAGNOSIS — Z91048 Other nonmedicinal substance allergy status: Secondary | ICD-10-CM | POA: Insufficient documentation

## 2021-06-14 HISTORY — PX: COLONOSCOPY WITH PROPOFOL: SHX5780

## 2021-06-14 SURGERY — COLONOSCOPY WITH PROPOFOL
Anesthesia: General

## 2021-06-14 MED ORDER — PROPOFOL 10 MG/ML IV BOLUS
INTRAVENOUS | Status: DC | PRN
  Administered 2021-06-14: 100 mg via INTRAVENOUS
  Administered 2021-06-14: 40 mg via INTRAVENOUS
  Administered 2021-06-14: 50 mg via INTRAVENOUS

## 2021-06-14 MED ORDER — LIDOCAINE HCL (CARDIAC) PF 100 MG/5ML IV SOSY
PREFILLED_SYRINGE | INTRAVENOUS | Status: DC | PRN
  Administered 2021-06-14: 50 mg via INTRAVENOUS

## 2021-06-14 MED ORDER — LACTATED RINGERS IV SOLN
INTRAVENOUS | Status: DC
  Administered 2021-06-14: 1000 mL via INTRAVENOUS

## 2021-06-14 NOTE — Op Note (Signed)
Hannah Hays Patient Name: Hannah Hays Procedure Date: 06/14/2021 8:50 AM MRN: [id number] Date of Birth: 01-28-1966 Attending MD: Elon Alas. Abbey Chatters DO CSN: [id number] Age: 55 Admit Type: Outpatient Procedure:                Colonoscopy Indications:              Surveillance: Personal history of adenomatous                            polyps on last colonoscopy 5 years ago Providers:                Elon Alas. Imara Standiford, DO, Otis Peak B. Sharon Seller, RN,                            Nelma Rothman, Technician Referring MD:              Medicines:                See the Anesthesia note for documentation of the                            administered medications Complications:            No immediate complications. Estimated Blood Loss:     Estimated blood loss: none. Procedure:                Pre-Anesthesia Assessment:                           - The anesthesia plan was to use monitored                            anesthesia care (MAC).                           After obtaining informed consent, the colonoscope                            was passed under direct vision. Throughout the                            procedure, the patient's blood pressure, pulse, and                            oxygen saturations were monitored continuously. The                            [id number]-[id number] ([id number]) scope was introduced through                            the anus and advanced to the the cecum, identified                            by appendiceal orifice and ileocecal valve. The                            colonoscopy was performed without  difficulty. The                            patient tolerated the procedure well. The quality                            of the bowel preparation was evaluated using the                            BBPS [facility] Bowel Preparation Scale) with scores                            of: Right Colon = 3, Transverse Colon = 3 and Left                            Colon = 3 (entire mucosa  seen well with no residual                            staining, small fragments of stool or opaque                            liquid). The total BBPS score equals 9. Scope In: 9:02:53 AM Scope Out: 9:12:15 AM Scope Withdrawal Time: 0 hours 6 minutes 11 seconds  Total Procedure Duration: 0 hours 9 minutes 22 seconds  Findings:      The perianal and digital rectal examinations were normal.      Non-bleeding internal hemorrhoids were found during endoscopy.      The entire examined colon appeared normal. Impression:               - Non-bleeding internal hemorrhoids.                           - The entire examined colon is normal.                           - No specimens collected. Moderate Sedation:      Per Anesthesia Care Recommendation:           - Patient has a contact number available for                            emergencies. The signs and symptoms of potential                            delayed complications were discussed with the                            patient. Return to normal activities tomorrow.                            Written discharge instructions were provided to the                            patient.                           -  Resume previous diet.                           - Continue present medications.                           - Await pathology results.                           - Repeat colonoscopy in 10 years for screening                            purposes.                           - Return to GI clinic PRN. Procedure Code(s):        --- Professional ---                           H4035, Colorectal cancer screening; colonoscopy on                            individual at high risk Diagnosis Code(s):        --- Professional ---                           Z86.010, Personal history of colonic polyps                           K64.8, Other hemorrhoids CPT copyright 2019 American Medical Association. All rights reserved. The codes documented in this report  are preliminary and upon coder review may  be revised to meet current compliance requirements. Elon Alas. Abbey Chatters, DO [place] Abbey Chatters, DO 06/14/2021 9:14:53 AM This report has been signed electronically. Number of Addenda: 0

## 2021-06-14 NOTE — Anesthesia Preprocedure Evaluation (Signed)
Anesthesia Evaluation  Patient identified by MRN, date of birth, ID band Patient awake    Reviewed: Allergy & Precautions, H&P , NPO status , Patient's Chart, lab work & pertinent test results, reviewed documented beta blocker date and time   Airway Mallampati: II  TM Distance: >3 FB Neck ROM: full    Dental no notable dental hx.    Pulmonary asthma ,    Pulmonary exam normal breath sounds clear to auscultation       Cardiovascular Exercise Tolerance: Good hypertension, negative cardio ROS   Rhythm:regular Rate:Normal     Neuro/Psych  Headaches, PSYCHIATRIC DISORDERS Anxiety Depression    GI/Hepatic Neg liver ROS, GERD  Medicated,  Endo/Other  negative endocrine ROS  Renal/GU negative Renal ROS  negative genitourinary   Musculoskeletal   Abdominal   Peds  Hematology negative hematology ROS (+)   Anesthesia Other Findings   Reproductive/Obstetrics negative OB ROS                             Anesthesia Physical Anesthesia Plan  ASA: 2  Anesthesia Plan: General   Post-op Pain Management:    Induction:   PONV Risk Score and Plan: Propofol infusion  Airway Management Planned:   Additional Equipment:   Intra-op Plan:   Post-operative Plan:   Informed Consent: I have reviewed the patients History and Physical, chart, labs and discussed the procedure including the risks, benefits and alternatives for the proposed anesthesia with the patient or authorized representative who has indicated his/her understanding and acceptance.     Dental Advisory Given  Plan Discussed with: CRNA  Anesthesia Plan Comments:         Anesthesia Quick Evaluation

## 2021-06-14 NOTE — Anesthesia Postprocedure Evaluation (Signed)
Anesthesia Post Note  Patient: Hannah Hays  Procedure(s) Performed: COLONOSCOPY WITH PROPOFOL  Patient location during evaluation: Phase II Anesthesia Type: General Level of consciousness: awake Pain management: pain level controlled Vital Signs Assessment: post-procedure vital signs reviewed and stable Respiratory status: spontaneous breathing and respiratory function stable Cardiovascular status: blood pressure returned to baseline and stable Postop Assessment: no headache and no apparent nausea or vomiting Anesthetic complications: no Comments: Late entry   No notable events documented.   Last Vitals:  Vitals:   06/14/21 0915 06/14/21 0918  BP: (!) 91/45 103/62  Pulse: (!) 56   Resp: (!) 22   Temp: 36.5 C   SpO2: 95%     Last Pain:  Vitals:   06/14/21 0915  TempSrc: Oral  PainSc: 0-No pain                 Louann Sjogren

## 2021-06-14 NOTE — Discharge Instructions (Addendum)

## 2021-06-14 NOTE — Transfer of Care (Signed)
Immediate Anesthesia Transfer of Care Note  Patient: Hannah Hays  Procedure(s) Performed: COLONOSCOPY WITH PROPOFOL  Patient Location: Endoscopy Unit  Anesthesia Type:General  Level of Consciousness: drowsy  Airway & Oxygen Therapy: Patient Spontanous Breathing  Post-op Assessment: Report given to RN and Post -op Vital signs reviewed and stable  Post vital signs: Reviewed and stable  Last Vitals:  Vitals Value Taken Time  BP    Temp    Pulse    Resp    SpO2      Last Pain:  Vitals:   06/14/21 0901  TempSrc:   PainSc: 0-No pain      Patients Stated Pain Goal: 9 (94/76/54 [id number])  Complications: No notable events documented.

## 2021-06-14 NOTE — Anesthesia Procedure Notes (Signed)
Date/Time: 06/14/2021 9:04 AM Performed by: Orlie Dakin, CRNA Pre-anesthesia Checklist: Emergency Drugs available, Patient identified, Suction available and Patient being monitored Patient Re-evaluated:Patient Re-evaluated prior to induction Oxygen Delivery Method: Nasal cannula Induction Type: IV induction Placement Confirmation: positive ETCO2

## 2021-06-14 NOTE — Telephone Encounter (Addendum)
Never heard back from pt.  Procedure today.

## 2021-06-16 NOTE — H&P (Signed)
Primary Care Physician:  [name], MD Primary Gastroenterologist:  Dr. Abbey Chatters  Pre-Procedure History & Physical: HPI:  Hannah Hays is a 55 y.o. female is here for a colonoscopy to be performed for surveillance purposes due to personal history of adenomatous colon polyps.  Past Medical History:  Diagnosis Date   Allergic rhinitis, seasonal    Anxiety    Asthma    Depression    GERD (gastroesophageal reflux disease)    Helicobacter pylori gastritis 12/05/2007   s/p Prevpac treatment   History of colonoscopy 12/04/2006   with simple adenoma   Hypertension    IBS (irritable bowel syndrome)    pre-dominant diarrhea   Migraine headache 17   recurrence 1st time in 10 year in 2011    Past Surgical History:  Procedure Laterality Date   ABDOMINAL HYSTERECTOMY     CHOLECYSTECTOMY     COLONOSCOPY  11/13/2007   SLF:A 4 mm sessile transverse colon polyp/Random biopsies obtained throughout the colon/Otherwise normal. Negative for microscopic colitis, simple adenoma, due for surveillance 11/2017 per [facility]   COLONOSCOPY N/A 02/02/2016   Procedure: COLONOSCOPY;  Surgeon: Danie Binder, MD;  [facility];  Service: Endoscopy;  Laterality: N/A;  12:15 PM   ESOPHAGOGASTRODUODENOSCOPY  03/20/2008   SLF:A 1- to 2-cm hiatal hernia, mild erythema in the antrum/Normal esophagus without evidence of Barrett's, +H.pylori s/p Prevapc treatment    ESOPHAGOGASTRODUODENOSCOPY (EGD) WITH ESOPHAGEAL DILATION N/A 02/14/2013   STM:[id number] Esophageal web/MILD Non-erosive gastritis (inflammation)   PARTIAL HYSTERECTOMY  2002   SEPTOPLASTY N/A 08/16/2015   Procedure: SEPTOPLASTY;  Surgeon: Leta Baptist, MD;  Location: [facility];  Service: ENT;  Laterality: N/A;   SINUS ENDO WITH FUSION Bilateral 08/16/2015   Procedure: ETHMOIDECTOMY, SPHENOIDECTOMY, MAXILLARY ANTROSTOMY, FRONTAL RECESS EXPLORATION WITH FUSION NAVIGATION;  Surgeon: Leta Baptist, MD;  Location: [place];   Service: ENT;  Laterality: Bilateral;   [place]    Prior to Admission medications   Medication Sig Start Date End Date Taking? Authorizing Provider  albuterol (PROAIR HFA) 108 (90 Base) MCG/ACT inhaler Inhale 2 puffs into the lungs every 4 (four) hours as needed for wheezing or shortness of breath. 04/06/21  Yes Valentina Shaggy, MD  amLODipine (NORVASC) 10 MG tablet TAKE 1 TABLET(10 MG) BY MOUTH DAILY Patient taking differently: Take 10 mg by mouth at bedtime. 03/28/21  Yes [name], MD  Benralizumab [facility]) 30 MG/ML SOSY Inject 1 mL (30 mg total) into the skin every 8 (eight) weeks. Dx code J45.50 12/16/20  Yes Valentina Shaggy, MD  BREZTRI AEROSPHERE 160-9-4.8 MCG/ACT AERO Inhale 2 puffs into the lungs 2 (two) times daily. Patient taking differently: Inhale 2 puffs into the lungs daily as needed (Asthma). 04/06/21  Yes Valentina Shaggy, MD  Calcium Carbonate-Vitamin D (CALCIUM 600 + D PO) Take 1 tablet by mouth 2 (two) times daily.   Yes [provider]  cetirizine (ZYRTEC) 10 MG tablet Take 10 mg by mouth daily as needed for allergies.   Yes [provider]  EPINEPHrine 0.3 mg/0.3 mL IJ SOAJ injection Inject 0.3 mg into the muscle as needed for anaphylaxis.   Yes [provider]  fluticasone (FLONASE) 50 MCG/ACT nasal spray SHAKE LIQUID AND USE 2 SPRAYS IN EACH NOSTRIL DAILY Patient taking differently: Place 2 sprays into both nostrils daily as needed for rhinitis or allergies. 10/25/20  Yes [name], MD  mirtazapine (REMERON) 15 MG tablet TAKE 1/2  TABLET BY MOUTH AT BEDTIME FOR SLEEP Patient taking differently: Take 7.5 mg by mouth daily as needed (Sleep). 01/25/21  Yes [name], MD  montelukast (SINGULAIR) 10 MG tablet Take 1 tablet (10 mg total) by mouth at bedtime. 04/06/21  Yes Valentina Shaggy, MD  Multiple Vitamin (MULTIVITAMIN WITH MINERALS) TABS Take 1 tablet by mouth at bedtime.    Yes [provider]  Multiple Vitamins-Minerals (MULTIVITAMIN WITH MINERALS) tablet Take 1 tablet by mouth daily.   Yes [provider]  rosuvastatin (CRESTOR) 20 MG tablet TAKE 1 TABLET(20 MG) BY MOUTH DAILY Patient taking differently: Take 20 mg by mouth daily. 07/28/20  Yes [name], MD  spironolactone (ALDACTONE) 25 MG tablet TAKE 1 TABLET(25 MG) BY MOUTH DAILY Patient taking differently: Take 25 mg by mouth at bedtime. 11/30/20  Yes [name], MD  tiotropium (SPIRIVA) 18 MCG inhalation capsule Place 18 mcg into inhaler and inhale daily.   Yes [provider]  ibuprofen (ADVIL) 800 MG tablet Take 1 tablet (800 mg total) by mouth every 8 (eight) hours as needed for headache. 04/22/20   Perlie Mayo, NP  rizatriptan (MAXALT) 5 MG tablet Take 1 tablet (5 mg total) by mouth as needed for migraine. May repeat in 2 hours if needed 04/22/20   Perlie Mayo, NP    Allergies as of 05/31/2021 - Review Complete 05/30/2021  Allergen Reaction Noted   Bacid Rash 02/08/2011    Family History  Problem Relation Age of Onset   Hypertension Mother    Heart disease Mother    Diabetes Mother    Alcohol abuse Father    Hypertension Father    Stroke Father    Hypertension Sister    Kidney disease Sister    Diabetes Sister    Hypertension Sister    Diabetes Sister    Diabetes Brother    Heart disease Brother    Hypertension Brother    Colon cancer Neg Hx     Social History   Socioeconomic History   Marital status: Married    Spouse name: Not on file   Number of children: 2   Years of education: Not on file   Highest education level: Not on file  Occupational History   Occupation: employed equity     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[employer]: [employer]  Tobacco Use   Smoking status: Never   Smokeless tobacco: Never  Substance and Sexual Activity   Alcohol use: No   Drug use: No   Sexual activity: Yes    Birth control/protection:  Surgical  Other Topics Concern   Not on file  Social History Narrative   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   Social Determinants of Health   Financial Resource Strain: Not on file  Food Insecurity: Not on file  Transportation Needs: Not on file  Physical Activity: Not on file  Stress: Not on file  Social Connections: Not on file  Intimate Partner Violence: Not on file    Review of Systems: See HPI, otherwise negative ROS  Physical Exam: Vital signs in last 24 hours:     General:   Alert,  Well-developed, well-nourished, pleasant and cooperative in NAD Head:  Normocephalic and atraumatic. Eyes:  Sclera clear, no icterus.   Conjunctiva pink. Ears:  Normal auditory acuity. Nose:  No deformity, discharge,  or lesions. Mouth:  No deformity or lesions, dentition normal. Neck:  Supple; no masses or  thyromegaly. Lungs:  Clear throughout to auscultation.   No wheezes, crackles, or rhonchi. No acute distress. Heart:  Regular rate and rhythm; no murmurs, clicks, rubs,  or gallops. Abdomen:  Soft, nontender and nondistended. No masses, hepatosplenomegaly or hernias noted. Normal bowel sounds, without guarding, and without rebound.   Msk:  Symmetrical without gross deformities. Normal posture. Extremities:  Without clubbing or edema. Neurologic:  Alert and  oriented x4;  grossly normal neurologically. Skin:  Intact without significant lesions or rashes. Cervical Nodes:  No significant cervical adenopathy. Psych:  Alert and cooperative. Normal mood and affect.  Impression/Plan: Hannah Hays is here for a colonoscopy to be performed for surveillance purposes due to personal history of adenomatous colon polyps.  The risks of the procedure including infection, bleed, or perforation as well as benefits, limitations, alternatives and imponderables have been reviewed with the patient. Questions have been answered. All parties agreeable.

## 2021-06-20 ENCOUNTER — Encounter (HOSPITAL_COMMUNITY): Payer: Self-pay | Admitting: Internal Medicine

## 2021-06-22 ENCOUNTER — Other Ambulatory Visit (HOSPITAL_COMMUNITY): Payer: Self-pay | Admitting: Family Medicine

## 2021-06-22 DIAGNOSIS — Z6824 Body mass index (BMI) 24.0-24.9, adult: Secondary | ICD-10-CM | POA: Diagnosis not present

## 2021-06-22 DIAGNOSIS — Z1389 Encounter for screening for other disorder: Secondary | ICD-10-CM | POA: Diagnosis not present

## 2021-06-22 DIAGNOSIS — Z130 Encounter for screening for diseases of the blood and blood-forming organs and certain disorders involving the immune mechanism: Secondary | ICD-10-CM | POA: Diagnosis not present

## 2021-06-22 DIAGNOSIS — Z1231 Encounter for screening mammogram for malignant neoplasm of breast: Secondary | ICD-10-CM

## 2021-06-22 DIAGNOSIS — Z01419 Encounter for gynecological examination (general) (routine) without abnormal findings: Secondary | ICD-10-CM | POA: Diagnosis not present

## 2021-06-23 ENCOUNTER — Other Ambulatory Visit: Payer: Self-pay | Admitting: Family Medicine

## 2021-06-23 DIAGNOSIS — I10 Essential (primary) hypertension: Secondary | ICD-10-CM

## 2021-06-30 ENCOUNTER — Ambulatory Visit (HOSPITAL_COMMUNITY): Payer: BC Managed Care – PPO

## 2021-07-13 ENCOUNTER — Ambulatory Visit (INDEPENDENT_AMBULATORY_CARE_PROVIDER_SITE_OTHER): Payer: BC Managed Care – PPO | Admitting: *Deleted

## 2021-07-13 ENCOUNTER — Ambulatory Visit (HOSPITAL_COMMUNITY)
Admission: RE | Admit: 2021-07-13 | Discharge: 2021-07-13 | Disposition: A | Discharge status: Home / Self Care | Payer: BC Managed Care – PPO | Source: Ambulatory Visit | Attending: Family Medicine | Admitting: Family Medicine

## 2021-07-13 ENCOUNTER — Other Ambulatory Visit: Payer: Self-pay

## 2021-07-13 DIAGNOSIS — Z1231 Encounter for screening mammogram for malignant neoplasm of breast: Secondary | ICD-10-CM | POA: Diagnosis not present

## 2021-07-13 DIAGNOSIS — J4550 Severe persistent asthma, uncomplicated: Secondary | ICD-10-CM | POA: Diagnosis not present

## 2021-07-21 ENCOUNTER — Ambulatory Visit (INDEPENDENT_AMBULATORY_CARE_PROVIDER_SITE_OTHER): Payer: BC Managed Care – PPO | Admitting: Family Medicine

## 2021-07-21 ENCOUNTER — Other Ambulatory Visit: Payer: Self-pay

## 2021-07-21 ENCOUNTER — Encounter: Payer: Self-pay | Admitting: Family Medicine

## 2021-07-21 VITALS — BP 128/72 | HR 55 | Resp 16 | Ht 65.0 in | Wt 151.0 lb

## 2021-07-21 DIAGNOSIS — Z23 Encounter for immunization: Secondary | ICD-10-CM | POA: Diagnosis not present

## 2021-07-21 DIAGNOSIS — I10 Essential (primary) hypertension: Secondary | ICD-10-CM

## 2021-07-21 DIAGNOSIS — E785 Hyperlipidemia, unspecified: Secondary | ICD-10-CM

## 2021-07-21 DIAGNOSIS — E663 Overweight: Secondary | ICD-10-CM

## 2021-07-21 DIAGNOSIS — E559 Vitamin D deficiency, unspecified: Secondary | ICD-10-CM

## 2021-07-21 DIAGNOSIS — R7302 Impaired glucose tolerance (oral): Secondary | ICD-10-CM | POA: Diagnosis not present

## 2021-07-21 DIAGNOSIS — J3089 Other allergic rhinitis: Secondary | ICD-10-CM

## 2021-07-21 DIAGNOSIS — J4540 Moderate persistent asthma, uncomplicated: Secondary | ICD-10-CM

## 2021-07-21 NOTE — Assessment & Plan Note (Signed)
Controlled, no change in medication DASH diet and commitment to daily physical activity for a minimum of 30 minutes discussed and encouraged, as a part of hypertension management. The importance of attaining a healthy weight is also discussed.  BP/Weight 07/21/2021 06/14/2021 05/30/2021 04/06/2021 12/28/2020 09/24/2020 Q000111Q  Systolic BP 0000000 XX123456 - 123XX123 Q000111Q [id number]  Diastolic BP 72 62 - 70 71 80 74  Wt. (Lbs) 151 157 154.4 160 150 - -  BMI 25.13 26.13 25.69 26.42 24.96 - -

## 2021-07-21 NOTE — Assessment & Plan Note (Signed)
Controlled, no change in medication  

## 2021-07-21 NOTE — Patient Instructions (Addendum)
F/U in 6 months, call if you need me sooner  Tdap in office today.  Call in September for your flu vaccine.  Please get your COVID booster in the next 1 to 2 weeks.  Congratulations on excellent health habits keep this up.  Stop Crestor for cholesterol continue healthy food choices and we will reassess in 6 months.  Labs today CBC lipid CMP and EGFR hemoglobin A1c TSH and vitamin D  It is important that you exercise regularly at least 30 minutes 5 times a week. If you develop chest pain, have severe difficulty breathing, or feel very tired, stop exercising immediately and seek medical attention   Think about what you will eat, plan ahead. Choose " clean, green, fresh or frozen" over canned, processed or packaged foods which are more sugary, salty and fatty. 70 to 75% of food eaten should be vegetables and fruit. Three meals at set times with snacks allowed between meals, but they must be fruit or vegetables. Aim to eat over a 12 hour period , example 7 am to 7 pm, and STOP after  your last meal of the day. Drink water,generally about 64 ounces per day, no other drink is as healthy. Fruit juice is best enjoyed in a healthy way, by EATING the fruit. Thanks for choosing [facility], we consider it a privelige to serve you.

## 2021-07-21 NOTE — Assessment & Plan Note (Signed)
  Patient re-educated about  the importance of commitment to a  minimum of 150 minutes of exercise per week as able.  The importance of healthy food choices with portion control discussed, as well as eating regularly and within a 12 hour window most days. The need to choose "clean , green" food 50 to 75% of the time is discussed, as well as to make water the primary drink and set a goal of 64 ounces water daily.    Weight /BMI 07/21/2021 06/14/2021 05/30/2021  WEIGHT 151 lb 157 lb 154 lb 6.4 oz  HEIGHT '5\' 5"'$  '5\' 5"'$  '5\' 5"'$   BMI 25.13 kg/m2 26.13 kg/m2 25.69 kg/m2    improved

## 2021-07-21 NOTE — Assessment & Plan Note (Signed)
Hyperlipidemia:Low fat diet discussed and encouraged.   Lipid Panel  Lab Results  Component Value Date   CHOL 156 12/08/2020   HDL 53 12/08/2020   LDLCALC 90 12/08/2020   TRIG 67 12/08/2020   CHOLHDL 2.9 12/08/2020     D/c cessor and re eval in 6 months

## 2021-07-21 NOTE — Assessment & Plan Note (Signed)
Patient educated about the importance of limiting  Carbohydrate intake , the need to commit to daily physical activity for a minimum of 30 minutes , and to commit weight loss. The fact that changes in all these areas will reduce or eliminate all together the development of diabetes is stressed.   Diabetic Labs Latest Ref Rng & Units 06/10/2021 12/08/2020 08/24/2020 07/01/2020 10/01/2019  HbA1c 4.0 - 5.6 % - - 6.1(A) - 5.9(H)  Chol 100 - 199 mg/dL - 156 - 144 157  HDL >39 mg/dL - 53 - 52 55  Calc LDL 0 - 99 mg/dL - 90 - 72 81  Triglycerides 0 - 149 mg/dL - 67 - 111 113  Creatinine 0.44 - 1.00 mg/dL 1.07(H) 0.86 - 0.83 0.83   BP/Weight 07/21/2021 06/14/2021 05/30/2021 04/06/2021 12/28/2020 09/24/2020 Q000111Q  Systolic BP 0000000 XX123456 - 123XX123 Q000111Q XX123456 [id number]  Diastolic BP 72 62 - 70 71 80 74  Wt. (Lbs) 151 157 154.4 160 150 - -  BMI 25.13 26.13 25.69 26.42 24.96 - -   No flowsheet data found.  Updated lab needed at/ before next visit.

## 2021-07-21 NOTE — Assessment & Plan Note (Signed)
Updated lab needed at/ before next visit.   

## 2021-07-21 NOTE — Progress Notes (Signed)
Hannah Hays     MRN: [id number]      DOB: 1965/12/23   HPI Hannah Hays is here for follow up and re-evaluation of chronic medical conditions, medication management and review of any available recent lab and radiology data.  Preventive health is updated, specifically  Cancer screening and Immunization.   Questions or concerns regarding consultations or procedures which the PT has had in the interim are  addressed. The PT denies any adverse reactions to current medications since the last visit.  There are no new concerns.  There are no specific complaints   ROS Denies recent fever or chills. Denies sinus pressure, nasal congestion, ear pain or sore throat. Denies chest congestion, productive cough or wheezing. Denies chest pains, palpitations and leg swelling Denies abdominal pain, nausea, vomiting,diarrhea or constipation.   Denies dysuria, frequency, hesitancy or incontinence. Denies joint pain, swelling and limitation in mobility. Denies headaches, seizures, numbness, or tingling. Denies depression, anxiety or insomnia. Denies skin break down or rash.   PE  BP 128/72   Pulse (!) 55   Resp 16   Ht '5\' 5"'$  (1.651 m)   Wt 151 lb (68.5 kg)   SpO2 96%   BMI 25.13 kg/m   Hannah Hays and in no cardiopulmonary distress.  HEENT: No facial asymmetry, EOMI,     Neck supple .  Chest: Clear to auscultation bilaterally.  CVS: S1, S2 no murmurs, no S3.Regular rate.  ABD: Soft non tender.   Ext: No edema  MS: Adequate ROM spine, shoulders, hips and knees.  Skin: Intact, no ulcerations or rash noted.  Psych: Good eye contact, normal affect. Memory intact not anxious or depressed appearing.  CNS: CN 2-12 intact, power,  normal throughout.no focal deficits noted.   Assessment & Plan  HTN (hypertension) Controlled, no change in medication DASH diet and commitment to daily physical activity for a minimum of 30 minutes discussed and encouraged, as a part of  hypertension management. The importance of attaining a healthy weight is also discussed.  BP/Weight 07/21/2021 06/14/2021 05/30/2021 04/06/2021 12/28/2020 09/24/2020 Q000111Q  Systolic BP 0000000 XX123456 - 123XX123 Q000111Q XX123456 XX123456  Diastolic BP 72 62 - 70 71 80 74  Wt. (Lbs) 151 157 154.4 160 150 - -  BMI 25.13 26.13 25.69 26.42 24.96 - -       Asthma Controlled, no change in medication   Hyperlipidemia LDL goal <100 Hyperlipidemia:Low fat diet discussed and encouraged.   Lipid Panel  Lab Results  Component Value Date   CHOL 156 12/08/2020   HDL 53 12/08/2020   LDLCALC 90 12/08/2020   TRIG 67 12/08/2020   CHOLHDL 2.9 12/08/2020     D/c cessor and re eval in 6 months  IGT (impaired glucose tolerance) Hannah educated about the importance of limiting  Carbohydrate intake , the need to commit to daily physical activity for a minimum of 30 minutes , and to commit weight loss. The fact that changes in all these areas will reduce or eliminate all together the development of diabetes is stressed.   Diabetic Labs Latest Ref Rng & Units 06/10/2021 12/08/2020 08/24/2020 07/01/2020 10/01/2019  HbA1c 4.0 - 5.6 % - - 6.1(A) - 5.9(H)  Chol 100 - 199 mg/dL - 156 - 144 157  HDL >39 mg/dL - 53 - 52 55  Calc LDL 0 - 99 mg/dL - 90 - 72 81  Triglycerides 0 - 149 mg/dL - 67 - 111 113  Creatinine 0.44 - 1.00 mg/dL  1.07(H) 0.86 - 0.83 0.83   BP/Weight 07/21/2021 06/14/2021 05/30/2021 04/06/2021 12/28/2020 09/24/2020 Q000111Q  Systolic BP 0000000 XX123456 - 123XX123 Q000111Q XX123456 XX123456  Diastolic BP 72 62 - 70 71 80 74  Wt. (Lbs) 151 157 154.4 160 150 - -  BMI 25.13 26.13 25.69 26.42 24.96 - -   No flowsheet data found.  Updated lab needed at/ before next visit.   Overweight (BMI 25.0-29.9)  Hannah re-educated about  the importance of commitment to a  minimum of 150 minutes of exercise per week as able.  The importance of healthy food choices with portion control discussed, as well as eating regularly and within a 12 hour window  most days. The need to choose "clean , green" food 50 to 75% of the time is discussed, as well as to make water the primary drink and set a goal of 64 ounces water daily.    Weight /BMI 07/21/2021 06/14/2021 05/30/2021  WEIGHT 151 lb 157 lb 154 lb 6.4 oz  HEIGHT '5\' 5"'$  '5\' 5"'$  '5\' 5"'$   BMI 25.13 kg/m2 26.13 kg/m2 25.69 kg/m2    improved  Vitamin D deficiency Updated lab needed at/ before next visit.   Perennial allergic rhinitis Controlled, no change in medication

## 2021-07-22 ENCOUNTER — Other Ambulatory Visit: Payer: Self-pay

## 2021-07-22 DIAGNOSIS — E785 Hyperlipidemia, unspecified: Secondary | ICD-10-CM

## 2021-07-22 DIAGNOSIS — R7302 Impaired glucose tolerance (oral): Secondary | ICD-10-CM

## 2021-07-22 LAB — CMP14+EGFR
ALT: 19 IU/L (ref 0–32)
AST: 26 IU/L (ref 0–40)
Albumin/Globulin Ratio: 2 (ref 1.2–2.2)
Albumin: 5 g/dL — ABNORMAL HIGH (ref 3.8–4.9)
Alkaline Phosphatase: 77 IU/L (ref 44–121)
BUN/Creatinine Ratio: 16 (ref 9–23)
BUN: 16 mg/dL (ref 6–24)
Bilirubin Total: 0.4 mg/dL (ref 0.0–1.2)
CO2: 23 mmol/L (ref 20–29)
Calcium: 9.9 mg/dL (ref 8.7–10.2)
Chloride: 101 mmol/L (ref 96–106)
Creatinine, Ser: 0.97 mg/dL (ref 0.57–1.00)
Globulin, Total: 2.5 g/dL (ref 1.5–4.5)
Glucose: 85 mg/dL (ref 65–99)
Potassium: 3.9 mmol/L (ref 3.5–5.2)
Sodium: 140 mmol/L (ref 134–144)
Total Protein: 7.5 g/dL (ref 6.0–8.5)
eGFR: 69 mL/min/{1.73_m2} (ref >59)

## 2021-07-22 LAB — LIPID PANEL
Chol/HDL Ratio: 2.6 ratio (ref 0.0–4.4)
Cholesterol, Total: 133 mg/dL (ref 100–199)
HDL: 52 mg/dL (ref >39)
LDL Chol Calc (NIH): 70 mg/dL (ref 0–99)
Triglycerides: 49 mg/dL (ref 0–149)
VLDL Cholesterol Cal: 11 mg/dL (ref 5–40)

## 2021-07-22 LAB — CBC
Hematocrit: 38.6 % (ref 34.0–46.6)
Hemoglobin: 13 g/dL (ref 11.1–15.9)
MCH: 28.3 pg (ref 26.6–33.0)
MCHC: 33.7 g/dL (ref 31.5–35.7)
MCV: 84 fL (ref 79–97)
Platelets: 165 10*3/uL (ref 150–450)
RBC: 4.6 x10E6/uL (ref 3.77–5.28)
RDW: 13.7 % (ref 11.7–15.4)
WBC: 6.2 10*3/uL (ref 3.4–10.8)

## 2021-07-22 LAB — HEMOGLOBIN A1C
Est. average glucose Bld gHb Est-mCnc: 131 mg/dL
Hgb A1c MFr Bld: 6.2 % — ABNORMAL HIGH (ref 4.8–5.6)

## 2021-07-22 LAB — TSH: TSH: 1.26 u[IU]/mL (ref 0.450–4.500)

## 2021-07-22 LAB — VITAMIN D 25 HYDROXY (VIT D DEFICIENCY, FRACTURES): Vit D, 25-Hydroxy: 38.1 ng/mL (ref 30.0–100.0)

## 2021-07-23 ENCOUNTER — Other Ambulatory Visit: Payer: Self-pay | Admitting: Allergy & Immunology

## 2021-07-23 ENCOUNTER — Other Ambulatory Visit: Payer: Self-pay | Admitting: Family Medicine

## 2021-09-07 ENCOUNTER — Ambulatory Visit (INDEPENDENT_AMBULATORY_CARE_PROVIDER_SITE_OTHER): Payer: BC Managed Care – PPO

## 2021-09-07 ENCOUNTER — Other Ambulatory Visit: Payer: Self-pay

## 2021-09-07 DIAGNOSIS — J4550 Severe persistent asthma, uncomplicated: Secondary | ICD-10-CM

## 2021-09-21 ENCOUNTER — Other Ambulatory Visit: Payer: Self-pay | Admitting: Family Medicine

## 2021-09-21 DIAGNOSIS — I10 Essential (primary) hypertension: Secondary | ICD-10-CM

## 2021-09-23 ENCOUNTER — Other Ambulatory Visit: Payer: Self-pay

## 2021-09-23 ENCOUNTER — Ambulatory Visit (INDEPENDENT_AMBULATORY_CARE_PROVIDER_SITE_OTHER): Payer: BC Managed Care – PPO

## 2021-09-23 DIAGNOSIS — Z23 Encounter for immunization: Secondary | ICD-10-CM | POA: Diagnosis not present

## 2021-10-05 ENCOUNTER — Ambulatory Visit (INDEPENDENT_AMBULATORY_CARE_PROVIDER_SITE_OTHER): Payer: BC Managed Care – PPO | Admitting: Allergy & Immunology

## 2021-10-05 ENCOUNTER — Encounter: Payer: Self-pay | Admitting: Allergy & Immunology

## 2021-10-05 ENCOUNTER — Other Ambulatory Visit: Payer: Self-pay

## 2021-10-05 VITALS — BP 118/70 | HR 58 | Temp 98.1°F | Resp 16 | Ht 65.5 in | Wt 148.8 lb

## 2021-10-05 DIAGNOSIS — J3089 Other allergic rhinitis: Secondary | ICD-10-CM | POA: Diagnosis not present

## 2021-10-05 DIAGNOSIS — J4551 Severe persistent asthma with (acute) exacerbation: Secondary | ICD-10-CM

## 2021-10-05 NOTE — Patient Instructions (Addendum)
1. Moderate persistent asthma, uncomplicated - Lung testing was in the 55% range and improved with the albuterol treatment. - I think we need to get back on a medication containing an inhaled steroid and long acting albuterol again. - We will add Symbicort back into the mix and continue with Spiriva.  - Start the prednisone dose pack provided.  - Daily controller medication(s): Symbicort 160/4.41mcg 2 puffs twice daily with spacer + Spiriva 1.46mcg 2 puffs once daily + Singulair 10mg  daily + Fasenra every 8 weeks - Rescue medications: ProAir 4 puffs every 4-6 hours as needed - Asthma control goals:  * Full participation in all desired activities (may need albuterol before activity) * Albuterol use two time or less a week on average (not counting use with activity) * Cough interfering with sleep two time or less a month * Oral steroids no more than once a year * No hospitalizations  2. Chronic allergic rhinitis  - Continue with Qnasl 86mcg two puffs once daily as needed.  - Continue with nasal saline rinses 1-2 times daily.  - Continue with Singulair 10mg  daily.  3. Return in about 4 weeks (around 11/02/2021).    Please inform us of any Emergency Department visits, hospitalizations, or changes in symptoms. Call us before going to the ED for breathing or allergy symptoms since we might be able to fit you in for a sick visit. Feel free to contact us anytime with any questions, problems, or concerns.  It was a pleasure to see you again today!  Websites that have reliable patient information: 1. American Academy of Asthma, Allergy, and Immunology: www.aaaai.org 2. Food Allergy Research and Education (FARE): foodallergy.org 3. Mothers of Asthmatics: http://www.asthmacommunitynetwork.org 4. American College of Allergy, Asthma, and Immunology: www.acaai.org   COVID-19 Vaccine Information can be found at: ShippingScam.co.uk For  questions related to vaccine distribution or appointments, please email [email or web address] or call [phone].   We realize that you might be concerned about having an allergic reaction to the COVID19 vaccines. To help with that concern, WE ARE OFFERING THE COVID19 VACCINES IN OUR OFFICE! Ask the front desk for dates!     "Like" [place] on Facebook and Instagram for our latest updates!      A healthy democracy works best when [employer] participate! Make sure you are registered to vote! If you have moved or changed any of your contact information, you will need to get this updated before voting!  In some cases, you MAY be able to register to vote online: [email or web address]

## 2021-10-05 NOTE — Progress Notes (Signed)
FOLLOW UP  Date of Service/Encounter:  10/05/21   Assessment:   Moderate persistent asthma - with acute exacerbation   Perennial allergic rhinitis   Adverse food reaction   [name] presents for follow-up visit.  Her spirometry looks even worse today.  She is not using her Judithann Sauger due to the cost.  This is clearly affecting her asthma control, as her spirometry has an FEV1 of 55%.  Thankfully, it does not prove to 79% following albuterol treatment.  We are starting her on a prednisone burst and changing her back to Symbicort which was covered better.  Plan/Recommendations:   1. Moderate persistent asthma, uncomplicated - Lung testing was in the 55% range and improved with the albuterol treatment. - I think we need to get back on a medication containing an inhaled steroid and long acting albuterol again. - We will add Symbicort back into the mix and continue with Spiriva.  - Start the prednisone dose pack provided.  - Daily controller medication(s): Symbicort 160/4.63mcg 2 puffs twice daily with spacer + Spiriva 1.40mcg 2 puffs once daily + Singulair 10mg  daily + Fasenra every 8 weeks - Rescue medications: ProAir 4 puffs every 4-6 hours as needed - Asthma control goals:  * Full participation in all desired activities (may need albuterol before activity) * Albuterol use two time or less a week on average (not counting use with activity) * Cough interfering with sleep two time or less a month * Oral steroids no more than once a year * No hospitalizations  2. Chronic allergic rhinitis  - Continue with Qnasl 39mcg two puffs once daily as needed.  - Continue with nasal saline rinses 1-2 times daily.  - Continue with Singulair 10mg  daily.  3. Return in about 4 weeks (around 11/02/2021).    Subjective:   Hannah Hays is a 55 y.o. female presenting today for follow up of  Chief Complaint  Patient presents with  . Follow-up    Patient in today for a follow up an has no new  problems.    Hannah Hays has a history of the following: Patient Active Problem List   Diagnosis Date Noted  . Posterior right knee pain 12/16/2020  . Overweight (BMI 25.0-29.9) 06/30/2020  . Insomnia 08/07/2018  . Hyperlipidemia LDL goal <100 10/16/2017  . Perennial allergic rhinitis 08/28/2017  . Moderate persistent asthma, uncomplicated 69/67/8938  . Hx of adenomatous colonic polyps   . Lipoma 12/26/2015  . Liver mass, left lobe 02/11/2015  . Nasal polyposis 08/20/2014  . Chronic nonallergic rhinitis 08/20/2014  . Vitamin D deficiency 07/12/2012  . HTN (hypertension) 08/19/2011  . GERD (gastroesophageal reflux disease) 04/11/2011  . IBS 12/19/2010  . Migraine 01/19/2010  . IGT (impaired glucose tolerance) 12/26/2009  . Asthma 11/15/2007    History obtained from: chart review and patient.  Hannah Hays is a 55 y.o. female presenting for a follow up visit.  We last saw her in May 2022.  At that time, lung testing was in the 60% range but improved with the albuterol.  We gave her steroid pack to start in case symptoms worsen.  We continue with her Breztri 1 to 2 puffs twice daily as well as Singulair and Fasenra.  Rhinitis, would continue with Qnasl 80 mcg 2 puffs daily as well as Singulair and salt water.  Since last visit, she reports.  Asthma/Respiratory Symptom History: Judithann Sauger was still around 90 dollars per month even with insurance, so she changed back to  Spiriva only.  She is using this tow puffs once daily every day. This is mostly free for her. She also has the [place] that she adds when things are truly bad. She was on Symbicort at first. She had previously been on Symbicort for years. This was covered better by insurance compared to the [place] in particular. She will stil lget the Breztri every so often to have on hand. She has not needed prednisone since I saw her last time.  She is using her rescue inhaler quite rarely, around once per week.  Allergic Rhinitis Symptom  History: She remains on the Qnasl 2 puffs once daily as well as Singulair.  She uses salt water rinses intermittently.  She has not had any sinus infections or ear infections.  Her daughter is continuing to have good days and bad days.  It has been 2 years since her son-in-law passed away.  They never did an autopsy.  The youngest child has not had any problems.  He was around 25-year-old when he died.  There are 2 older children and we did some counseling after his death, but are otherwise doing fine.  Hannah Hays is not sure if they have been checked for cardiac defects including hypertrophic cardiomyopathy.  She does know that her drug screen performed at the time was negative.  He had no history of drug use.  Otherwise, there have been no changes to her past medical history, surgical history, family history, or social history.    Review of Systems  Constitutional: Negative.  Negative for chills, fever, malaise/fatigue and weight loss.  HENT: Negative.  Negative for congestion, ear discharge and ear pain.   Eyes:  Negative for pain, discharge and redness.  Respiratory:  Positive for shortness of breath. Negative for cough, sputum production and wheezing.   Cardiovascular: Negative.  Negative for chest pain and palpitations.  Gastrointestinal:  Negative for abdominal pain, heartburn, nausea and vomiting.  Skin: Negative.  Negative for itching and rash.  Neurological:  Negative for dizziness and headaches.  Endo/Heme/Allergies:  Negative for environmental allergies. Does not bruise/bleed easily.      Objective:   Blood pressure 118/70, pulse (!) 58, temperature 98.1 F (36.7 C), temperature source Temporal, resp. rate 16, height 5' 5.5" (1.664 m), weight 148 lb 12.8 oz (67.5 kg), SpO2 99 %. Body mass index is 24.39 kg/m.   Physical Exam:  Physical Exam Vitals reviewed.  Constitutional:      Appearance: She is well-developed.  HENT:     Head: Normocephalic and atraumatic.     Right Ear:  Tympanic membrane, ear canal and external ear normal.     Left Ear: Tympanic membrane, ear canal and external ear normal.     Nose: No nasal deformity, septal deviation, mucosal edema or rhinorrhea.     Right Turbinates: Enlarged, swollen and pale.     Left Turbinates: Enlarged, swollen and pale.     Right Sinus: No maxillary sinus tenderness or frontal sinus tenderness.     Left Sinus: No maxillary sinus tenderness or frontal sinus tenderness.     Mouth/Throat:     Lips: Pink.     Mouth: Mucous membranes are moist. Mucous membranes are not pale and not dry.     Pharynx: Uvula midline.     Comments: Cobblestoning mild. Eyes:     General: Lids are normal. No allergic shiner.       Right eye: No discharge.        Left eye: No discharge.  Conjunctiva/sclera: Conjunctivae normal.     Right eye: Right conjunctiva is not injected. No chemosis.    Left eye: Left conjunctiva is not injected. No chemosis.    Pupils: Pupils are equal, round, and reactive to light.  Cardiovascular:     Rate and Rhythm: Normal rate and regular rhythm.     Heart sounds: Normal heart sounds.  Pulmonary:     Effort: Pulmonary effort is normal. No tachypnea, accessory muscle usage or respiratory distress.     Breath sounds: Normal breath sounds. No wheezing, rhonchi or rales.     Comments: Decreased air movement at the bases.  Chest:     Chest wall: No tenderness.  Lymphadenopathy:     Cervical: No cervical adenopathy.  Skin:    General: Skin is warm.     Capillary Refill: Capillary refill takes less than 2 seconds.     Coloration: Skin is not pale.     Findings: No abrasion, erythema, petechiae or rash. Rash is not papular, urticarial or vesicular.  Neurological:     Mental Status: She is alert.  Psychiatric:        Behavior: Behavior is cooperative.     Diagnostic studies:    Spirometry: results abnormal (FEV1: 1.29/55%, FVC: 2.08/71%, FEV1/FVC: 62%).    Spirometry consistent with possible  restrictive disease. Xopenex four puffs via MDI treatment given in clinic with significant improvement in FEV1 and FVC per ATS criteria.  Allergy Studies:  none        [name], MD  [facility]

## 2021-10-13 ENCOUNTER — Other Ambulatory Visit: Payer: Self-pay | Admitting: Family Medicine

## 2021-10-14 ENCOUNTER — Other Ambulatory Visit: Payer: Self-pay | Admitting: Family Medicine

## 2021-10-16 ENCOUNTER — Other Ambulatory Visit: Payer: Self-pay | Admitting: Allergy & Immunology

## 2021-11-01 NOTE — Patient Instructions (Addendum)
1. Moderate persistent asthma, uncomplicated - Daily controller medication(s): Symbicort 160/4.2mcg 2 puffs twice daily with spacer + Spiriva 1.82mcg 2 puffs once daily + Singulair 10mg  daily + Fasenra every 8 weeks - Rescue medications: ProAir 4 puffs every 4-6 hours as needed - Asthma control goals:  * Full participation in all desired activities (may need albuterol before activity) * Albuterol use two time or less a week on average (not counting use with activity) * Cough interfering with sleep two time or less a month * Oral steroids no more than once a year * No hospitalizations  2. Chronic allergic rhinitis  - Continue with fluticasone two puffs once daily as needed for stuffy nose  - Continue with nasal saline rinses 1-2 times daily.  - Continue with Singulair 10mg  daily. -Continue Zyrtec 10 mg as needed for runny nose  Please let us know if this treatment plan is  not working well for you. Schedule a follow up appointment in 3-4 months or sooner if needed

## 2021-11-02 ENCOUNTER — Other Ambulatory Visit: Payer: Self-pay

## 2021-11-02 ENCOUNTER — Ambulatory Visit: Payer: BC Managed Care – PPO

## 2021-11-02 ENCOUNTER — Ambulatory Visit (INDEPENDENT_AMBULATORY_CARE_PROVIDER_SITE_OTHER): Payer: BC Managed Care – PPO | Admitting: Family

## 2021-11-02 ENCOUNTER — Encounter: Payer: Self-pay | Admitting: Family

## 2021-11-02 VITALS — BP 122/64 | HR 78 | Temp 97.7°F | Resp 16 | Ht 65.5 in | Wt 152.0 lb

## 2021-11-02 DIAGNOSIS — J4550 Severe persistent asthma, uncomplicated: Secondary | ICD-10-CM | POA: Diagnosis not present

## 2021-11-02 DIAGNOSIS — J3089 Other allergic rhinitis: Secondary | ICD-10-CM | POA: Diagnosis not present

## 2021-11-02 MED ORDER — EPINEPHRINE 0.3 MG/0.3ML IJ SOAJ: 0.3000 mg | INTRAMUSCULAR | 1 refills | Status: DC | PRN

## 2021-11-02 NOTE — Progress Notes (Signed)
[place], [place] [address] Dept: [phone]  FOLLOW UP NOTE  Patient ID: Hannah Hays, female    DOB: January 03, 1966  Age: 55 y.o. MRN: [id number] Date of Office Visit: 11/02/2021  Assessment  Chief Complaint: Asthma (4 week f/u - much better)  HPI Hannah Hays is a 55 year old female who presents today for follow-up of moderate persistent asthma and perennial allergic rhinitis.  She was last seen on October 05, 2021 by Dr. Ernst Bowler.  Since her last office visit she denies any new diagnosis or surgeries.  Moderate persistent asthma is reported as doing better since her last office visit.  She is currently taking Symbicort 160/4.5 mcg 2 puffs twice a day with spacer, Spiriva 1.25 mcg 2 puffs once a day, Singulair 10 mg once a day and Fasenra injections every 8 weeks.  She reports that her Berna Bue injections help and she denies any reactions with her Fasenra injections.  She is not certain if her EpiPen is up-to-date, but she has not had to use her EpiPen.  She reports shortness of breath with strenuous activity only and wheezing if doing a lot of fast walking.  She denies coughing, tightness in her chest, and nocturnal awakenings due to breathing problems.  Since her last office visit she has not required any trips to the emergency room or urgent care due to breathing problems.  The only steroid she has had since her last office visit was the one given by Dr. Ernst Bowler at her last office visit.  She has not had to use her albuterol inhaler since we last saw her.  Perennial allergic rhinitis is reported as moderately controlled with fluticasone nasal spray 2 sprays each nostril once a day as needed, Singulair 10 mg once a day, Zyrtec 10 mg as needed, and saline rinses as needed.  She reports a little bit of postnasal drip last week, but none now.  She denies rhinorrhea and nasal congestion.  She has not had any sinus infections since we last saw her.  She reports that she  has a history of nasal polyps and her sinus surgery was approximately 4 to 5 years ago.   Drug Allergies:  Allergies  Allergen Reactions   P[place]apple Other (See Comments)    Mouth Swelling   Bacid Rash    Review of Systems: Review of Systems  Constitutional:  Negative for chills and fever.  HENT:         Reports a little bit of postnasal drip last week, but none now.  Also denies rhinorrhea nasal congestion  Eyes:        Denies itchy watery eyes  Respiratory:  Positive for shortness of breath and wheezing. Negative for cough.        Reports wheezing if fast walking and shortness of breath with strenuous activity.  Denies tightness in her chest, coughing, and nocturnal awakenings due to breathing problems  Cardiovascular:  Negative for chest pain and palpitations.  Gastrointestinal:        Reports occasional heartburn and reflux symptoms.  Takes Nexium  Genitourinary:  Negative for frequency.  Skin:  Negative for itching and rash.  Neurological:  Negative for headaches.  Endo/Heme/Allergies:  Positive for environmental allergies.    Physical Exam: BP 122/64   Pulse 78   Temp 97.7 F (36.5 C)   Resp 16   Ht 5' 5.5" (1.664 m)   Wt 152 lb (68.9 kg)   SpO2 97%   BMI 24.91 kg/m  Physical Exam Constitutional:      Appearance: Normal appearance.  HENT:     Head: Normocephalic and atraumatic.     Comments: Pharynx normal, eyes normal, ears normal, nose: Bilateral lower turbinates moderately edematous and slightly erythematous with clear drainage noted    Right Ear: Tympanic membrane, ear canal and external ear normal.     Left Ear: Tympanic membrane, ear canal and external ear normal.     Mouth/Throat:     Mouth: Mucous membranes are moist.     Pharynx: Oropharynx is clear.  Eyes:     Conjunctiva/sclera: Conjunctivae normal.  Cardiovascular:     Rate and Rhythm: Regular rhythm.     Heart sounds: Normal heart sounds.  Pulmonary:     Effort: Pulmonary effort is normal.      Breath sounds: Normal breath sounds.     Comments: Lungs clear to auscultation Musculoskeletal:     Cervical back: Neck supple.  Skin:    General: Skin is warm.  Neurological:     Mental Status: She is alert and oriented to person, place, and time.  Psychiatric:        Mood and Affect: Mood normal.        Behavior: Behavior normal.        Thought Content: Thought content normal.        Judgment: Judgment normal.    Diagnostics: FVC 2.55 L, FEV1 1.86 L.  Predicted FVC 2.97 L, predicted FEV1 2.36 L.  Spirometry indicates normal respiratory function.  Assessment and Plan: 1. Severe persistent asthma without complication   2. Perennial allergic rhinitis     Meds ordered this encounter  Medications   EPINEPHrine 0.3 mg/0.3 mL IJ SOAJ injection    Sig: Inject 0.3 mg into the muscle as needed for anaphylaxis.    Dispense:  1 each    Refill:  1     Patient Instructions  1. Moderate persistent asthma, uncomplicated - Daily controller medication(s): Symbicort 160/4.7mcg 2 puffs twice daily with spacer + Spiriva 1.20mcg 2 puffs once daily + Singulair 10mg  daily + Fasenra every 8 weeks - Rescue medications: ProAir 4 puffs every 4-6 hours as needed - Asthma control goals:  * Full participation in all desired activities (may need albuterol before activity) * Albuterol use two time or less a week on average (not counting use with activity) * Cough interfering with sleep two time or less a month * Oral steroids no more than once a year * No hospitalizations  2. Chronic allergic rhinitis  - Continue with fluticasone two puffs once daily as needed for stuffy nose  - Continue with nasal saline rinses 1-2 times daily.  - Continue with Singulair 10mg  daily. -Continue Zyrtec 10 mg as needed for runny nose  Please let us know if this treatment plan is  not working well for you. Schedule a follow up appointment in 3-4 months or sooner if needed   Return in about 3 months (around  01/31/2022), or if symptoms worsen or fail to improve.    Thank you for the opportunity to care for this patient.  Please do not hesitate to contact me with questions.  Althea Charon, FNP [facility]

## 2021-11-04 ENCOUNTER — Ambulatory Visit (INDEPENDENT_AMBULATORY_CARE_PROVIDER_SITE_OTHER): Payer: BC Managed Care – PPO

## 2021-11-04 ENCOUNTER — Other Ambulatory Visit: Payer: Self-pay

## 2021-11-04 DIAGNOSIS — Z23 Encounter for immunization: Secondary | ICD-10-CM

## 2021-11-04 NOTE — Progress Notes (Signed)
   Covid-19 Vaccination Clinic  Name:  DEVOTA VIRUET    MRN: [id number] DOB: 05-24-1966  11/04/2021  Ms. Batrez was observed post Covid-19 immunization for 15 minutes without incident. She was provided with Vaccine Information Sheet and instruction to access the V-Safe system.   Ms. Carton was instructed to call 911 with any severe reactions post vaccine: Difficulty breathing  Swelling of face and throat  A fast heartbeat  A bad rash all over body  Dizziness and weakness   Immunizations Administered     Name Date Dose VIS Date Route   Moderna Covid-19 vaccine Bivalent Booster 11/04/2021  9:34 AM 0.5 mL 07/16/2021 Intramuscular   Manufacturer: Moderna   Lot: [id number]   [place]: 14431-540-08

## 2021-12-05 ENCOUNTER — Other Ambulatory Visit: Payer: Self-pay | Admitting: Allergy & Immunology

## 2021-12-28 ENCOUNTER — Ambulatory Visit (INDEPENDENT_AMBULATORY_CARE_PROVIDER_SITE_OTHER): Payer: BC Managed Care – PPO

## 2021-12-28 ENCOUNTER — Other Ambulatory Visit: Payer: Self-pay

## 2021-12-28 DIAGNOSIS — J4550 Severe persistent asthma, uncomplicated: Secondary | ICD-10-CM | POA: Diagnosis not present

## 2022-01-26 ENCOUNTER — Other Ambulatory Visit: Payer: Self-pay

## 2022-01-26 ENCOUNTER — Ambulatory Visit (INDEPENDENT_AMBULATORY_CARE_PROVIDER_SITE_OTHER): Payer: BC Managed Care – PPO | Admitting: Family Medicine

## 2022-01-26 ENCOUNTER — Encounter: Payer: Self-pay | Admitting: Family Medicine

## 2022-01-26 VITALS — BP 130/70 | HR 56 | Ht 65.0 in | Wt 146.0 lb

## 2022-01-26 DIAGNOSIS — E663 Overweight: Secondary | ICD-10-CM

## 2022-01-26 DIAGNOSIS — I10 Essential (primary) hypertension: Secondary | ICD-10-CM | POA: Diagnosis not present

## 2022-01-26 DIAGNOSIS — Z1231 Encounter for screening mammogram for malignant neoplasm of breast: Secondary | ICD-10-CM | POA: Diagnosis not present

## 2022-01-26 DIAGNOSIS — E785 Hyperlipidemia, unspecified: Secondary | ICD-10-CM

## 2022-01-26 DIAGNOSIS — R7302 Impaired glucose tolerance (oral): Secondary | ICD-10-CM

## 2022-01-26 DIAGNOSIS — F5101 Primary insomnia: Secondary | ICD-10-CM

## 2022-01-26 DIAGNOSIS — J3089 Other allergic rhinitis: Secondary | ICD-10-CM

## 2022-01-26 NOTE — Patient Instructions (Addendum)
F/U in 6 month, call if you need me before  Labs today, lipid, cmp and EGFr, HBA1C  Fasting CBC, lipid, cmp and EGFr, hBA1C, TSH and vit D 5 days before follow up   Please sched August mammogram at checkout  Keep up the great work!!  6 pounds weight loss  Result message via my chart  Thanks for choosing [facility], we consider it a privelige to serve you.

## 2022-01-27 LAB — LIPID PANEL
Chol/HDL Ratio: 3.2 ratio (ref 0.0–4.4)
Cholesterol, Total: 155 mg/dL (ref 100–199)
HDL: 48 mg/dL (ref >39)
LDL Chol Calc (NIH): 87 mg/dL (ref 0–99)
Triglycerides: 111 mg/dL (ref 0–149)
VLDL Cholesterol Cal: 20 mg/dL (ref 5–40)

## 2022-01-27 LAB — CMP14+EGFR
ALT: 15 IU/L (ref 0–32)
AST: 19 IU/L (ref 0–40)
Albumin/Globulin Ratio: 1.9 (ref 1.2–2.2)
Albumin: 4.7 g/dL (ref 3.8–4.9)
Alkaline Phosphatase: 90 IU/L (ref 44–121)
BUN/Creatinine Ratio: 14 (ref 9–23)
BUN: 12 mg/dL (ref 6–24)
Bilirubin Total: 0.3 mg/dL (ref 0.0–1.2)
CO2: 27 mmol/L (ref 20–29)
Calcium: 9.7 mg/dL (ref 8.7–10.2)
Chloride: 104 mmol/L (ref 96–106)
Creatinine, Ser: 0.87 mg/dL (ref 0.57–1.00)
Globulin, Total: 2.5 g/dL (ref 1.5–4.5)
Glucose: 90 mg/dL (ref 70–99)
Potassium: 4.2 mmol/L (ref 3.5–5.2)
Sodium: 144 mmol/L (ref 134–144)
Total Protein: 7.2 g/dL (ref 6.0–8.5)
eGFR: 78 mL/min/{1.73_m2} (ref >59)

## 2022-01-27 LAB — HEMOGLOBIN A1C
Est. average glucose Bld gHb Est-mCnc: 134 mg/dL
Hgb A1c MFr Bld: 6.3 % — ABNORMAL HIGH (ref 4.8–5.6)

## 2022-01-29 ENCOUNTER — Encounter: Payer: Self-pay | Admitting: Family Medicine

## 2022-01-29 NOTE — Assessment & Plan Note (Signed)
Controlled, no change in medication DASH diet and commitment to daily physical activity for a minimum of 30 minutes discussed and encouraged, as a part of hypertension management. The importance of attaining a healthy weight is also discussed.  BP/Weight 01/26/2022 11/02/2021 10/05/2021 07/21/2021 06/14/2021 9/67/2897 08/04/5040  Systolic BP 364 383 779 396 886 - 484  Diastolic BP 70 64 70 72 62 - 70  Wt. (Lbs) 146 152 148.8 151 157 154.4 160  BMI 24.3 24.91 24.39 25.13 26.13 25.69 26.42

## 2022-01-29 NOTE — Assessment & Plan Note (Signed)
Patient educated about the importance of limiting  Carbohydrate intake , the need to commit to daily physical activity for a minimum of 30 minutes , and to commit weight loss. The fact that changes in all these areas will reduce or eliminate all together the development of diabetes is stressed.  Deteriorated, needs o reduce swets and starches  Diabetic Labs Latest Ref Rng & Units 01/26/2022 07/21/2021 06/10/2021 12/08/2020 08/24/2020  HbA1c 4.8 - 5.6 % 6.3(H) 6.2(H) - - 6.1(A)  Chol 100 - 199 mg/dL 155 133 - 156 -  HDL >39 mg/dL 48 52 - 53 -  Calc LDL 0 - 99 mg/dL 87 70 - 90 -  Triglycerides 0 - 149 mg/dL 111 49 - 67 -  Creatinine 0.57 - 1.00 mg/dL 0.87 0.97 1.07(H) 0.86 -   BP/Weight 01/26/2022 11/02/2021 10/05/2021 07/21/2021 06/14/2021 04/04/7740 01/11/7866  Systolic BP 672 094 709 628 366 - 294  Diastolic BP 70 64 70 72 62 - 70  Wt. (Lbs) 146 152 148.8 151 157 154.4 160  BMI 24.3 24.91 24.39 25.13 26.13 25.69 26.42   No flowsheet data found.

## 2022-01-29 NOTE — Progress Notes (Signed)
Hannah Hays     MRN: [id number]      DOB: 1966/07/02   HPI Ms. Halberg is here for follow up and re-evaluation of chronic medical conditions, medication management and review of any available recent lab and radiology data.  Preventive health is updated, specifically  Cancer screening and Immunization.   Questions or concerns regarding consultations or procedures which the PT has had in the interim are  addressed. The PT denies any adverse reactions to current medications since the last visit.  There are no new concerns.  Reports doing well on immunotherapy for allergy and asthma control. ROS Denies recent fever or chills. Denies sinus pressure, nasal congestion, ear pain or sore throat. Denies chest congestion, productive cough or wheezing. Denies chest pains, palpitations and leg swelling Denies abdominal pain, nausea, vomiting,diarrhea or constipation.   Denies dysuria, frequency, hesitancy or incontinence. Denies joint pain, swelling and limitation in mobility. Denies headaches, seizures, numbness, or tingling. Denies depression, anxiety or insomnia. Denies skin break down or rash.   PE  BP 130/70    Pulse (!) 56    Ht 5\' 5"  (1.651 m)    Wt 146 lb (66.2 kg)    SpO2 98%    BMI 24.30 kg/m   Patient alert and oriented and in no cardiopulmonary distress.  HEENT: No facial asymmetry, EOMI,     Neck supple .  Chest: Clear to auscultation bilaterally.  CVS: S1, S2 no murmurs, no S3.Regular rate.  ABD: Soft non tender.   Ext: No edema  MS: Adequate ROM spine, shoulders, hips and knees.  Skin: Intact, no ulcerations or rash noted.  Psych: Good eye contact, normal affect. Memory intact not anxious or depressed appearing.  CNS: CN 2-12 intact, power,  normal throughout.no focal deficits noted.   Assessment & Plan  HTN (hypertension) Controlled, no change in medication DASH diet and commitment to daily physical activity for a minimum of 30 minutes discussed and  encouraged, as a part of hypertension management. The importance of attaining a healthy weight is also discussed.  BP/Weight 01/26/2022 11/02/2021 10/05/2021 07/21/2021 06/14/2021 08/24/1940 06/06/813  Systolic BP 481 856 314 970 263 - 785  Diastolic BP 70 64 70 72 62 - 70  Wt. (Lbs) 146 152 148.8 151 157 154.4 160  BMI 24.3 24.91 24.39 25.13 26.13 25.69 26.42       Hyperlipidemia LDL goal <100 Hyperlipidemia:Low fat diet discussed and encouraged.   Lipid Panel  Lab Results  Component Value Date   CHOL 155 01/26/2022   HDL 48 01/26/2022   LDLCALC 87 01/26/2022   TRIG 111 01/26/2022   CHOLHDL 3.2 01/26/2022     Controlled, no change in medication   IGT (impaired glucose tolerance) Patient educated about the importance of limiting  Carbohydrate intake , the need to commit to daily physical activity for a minimum of 30 minutes , and to commit weight loss. The fact that changes in all these areas will reduce or eliminate all together the development of diabetes is stressed.  Deteriorated, needs o reduce swets and starches  Diabetic Labs Latest Ref Rng & Units 01/26/2022 07/21/2021 06/10/2021 12/08/2020 08/24/2020  HbA1c 4.8 - 5.6 % 6.3(H) 6.2(H) - - 6.1(A)  Chol 100 - 199 mg/dL 155 133 - 156 -  HDL >39 mg/dL 48 52 - 53 -  Calc LDL 0 - 99 mg/dL 87 70 - 90 -  Triglycerides 0 - 149 mg/dL 111 49 - 67 -  Creatinine 0.57 - 1.00  mg/dL 0.87 0.97 1.07(H) 0.86 -   BP/Weight 01/26/2022 11/02/2021 10/05/2021 07/21/2021 06/14/2021 07/23/8137 07/10/1958  Systolic BP 747 185 501 586 825 - 749  Diastolic BP 70 64 70 72 62 - 70  Wt. (Lbs) 146 152 148.8 151 157 154.4 160  BMI 24.3 24.91 24.39 25.13 26.13 25.69 26.42   No flowsheet data found.    Insomnia Sleep hygiene reviewed and written information offered also. Prescription sent for  medication needed.   Overweight (BMI 25.0-29.9)  Patient re-educated about  the importance of commitment to a  minimum of 150 minutes of exercise per week as  able.  The importance of healthy food choices with portion control discussed, as well as eating regularly and within a 12 hour window most days. The need to choose "clean , green" food 50 to 75% of the time is discussed, as well as to make water the primary drink and set a goal of 64 ounces water daily.    Weight /BMI 01/26/2022 11/02/2021 10/05/2021  WEIGHT 146 lb 152 lb 148 lb 12.8 oz  HEIGHT 5\' 5"  5' 5.5" 5' 5.5"  BMI 24.3 kg/m2 24.91 kg/m2 24.39 kg/m2  improved    Perennial allergic rhinitis Well controlled on current meds, continue same

## 2022-01-29 NOTE — Assessment & Plan Note (Signed)
Sleep hygiene reviewed and written information offered also. Prescription sent for  medication needed.  

## 2022-01-29 NOTE — Assessment & Plan Note (Signed)
Hyperlipidemia:Low fat diet discussed and encouraged.   Lipid Panel  Lab Results  Component Value Date   CHOL 155 01/26/2022   HDL 48 01/26/2022   LDLCALC 87 01/26/2022   TRIG 111 01/26/2022   CHOLHDL 3.2 01/26/2022     Controlled, no change in medication

## 2022-01-29 NOTE — Assessment & Plan Note (Signed)
Well controlled on current meds, continue same

## 2022-01-29 NOTE — Assessment & Plan Note (Signed)
°  Patient re-educated about  the importance of commitment to a  minimum of 150 minutes of exercise per week as able.  The importance of healthy food choices with portion control discussed, as well as eating regularly and within a 12 hour window most days. The need to choose "clean , green" food 50 to 75% of the time is discussed, as well as to make water the primary drink and set a goal of 64 ounces water daily.    Weight /BMI 01/26/2022 11/02/2021 10/05/2021  WEIGHT 146 lb 152 lb 148 lb 12.8 oz  HEIGHT 5\' 5"  5' 5.5" 5' 5.5"  BMI 24.3 kg/m2 24.91 kg/m2 24.39 kg/m2  improved

## 2022-02-07 NOTE — Patient Instructions (Addendum)
1. Moderate persistent asthma, uncomplicated ?- Daily controller medication(s): Symbicort 160/4.74mg 2 puffs twice daily with spacer + Spiriva 1.273m 2 puffs once daily + Singulair '10mg'$  daily + Fasenra every 8 weeks ?- Rescue medications: albuterol 2 puffs every 4-6 hours as needed ?- Asthma control goals:  ?* Full participation in all desired activities (may need albuterol before activity) ?* Albuterol use two time or less a week on average (not counting use with activity) ?* Cough interfering with sleep two time or less a month ?* Oral steroids no more than once a year ?* No hospitalizations ? ?2. Chronic allergic rhinitis  ?- Continue with fluticasone two puffs once daily as needed for stuffy nose  ?- Continue with nasal saline rinses 1-2 times daily.  ?- Continue with Singulair '10mg'$  daily. ?-Continue Zyrtec 10 mg as needed for runny nose ? ?Please let usKoreanow if this treatment plan is  not working well for you. ?Schedule a follow up appointment in 4-6 months or sooner if needed ? ? ?

## 2022-02-08 ENCOUNTER — Other Ambulatory Visit: Payer: Self-pay

## 2022-02-08 ENCOUNTER — Ambulatory Visit (INDEPENDENT_AMBULATORY_CARE_PROVIDER_SITE_OTHER): Payer: BC Managed Care – PPO | Admitting: Family

## 2022-02-08 ENCOUNTER — Encounter: Payer: Self-pay | Admitting: Family

## 2022-02-08 VITALS — BP 120/76 | HR 66 | Temp 98.1°F | Resp 16 | Ht 61.0 in | Wt 149.0 lb

## 2022-02-08 DIAGNOSIS — J3089 Other allergic rhinitis: Secondary | ICD-10-CM | POA: Diagnosis not present

## 2022-02-08 DIAGNOSIS — J4550 Severe persistent asthma, uncomplicated: Secondary | ICD-10-CM | POA: Diagnosis not present

## 2022-02-08 NOTE — Progress Notes (Signed)
? ?[address], [place] [address] ?Dept: [phone] ? ?FOLLOW UP NOTE ? ?Patient ID: DELONNA NEY, female    DOB: Apr 10, 1966  Age: 56 y.o. MRN: [id number] ?Date of Office Visit: 02/08/2022 ? ?Assessment  ?Chief Complaint: Asthma and Allergic Rhinitis  ? ?HPI ?Hannah Hays is a 56 year old female who presents today for follow-up of moderate persistent asthma and perennial allergic rhinitis.  She was last seen on November 02, 2021 by Althea Charon, FNP.  Since her last office visit she denies any new diagnosis or surgeries. ? ?Moderate persistent asthma is reported as moderately controlled with Symbicort 160/4.5 mcg 2 puffs twice a day with spacer, Spiriva Respimat 1.25 mcg 2 puffs once a day, Singulair 10 mg once a day, and Fasenra every 8 weeks.  She reports a little bit of tightness in her chest every now and then and shortness of breath with a lot of walking.  She reports that her shortness of breath is no more than normal.  She denies coughing, wheezing, and nocturnal awakenings due to breathing problems.  Since her last office visit she has not required any systemic steroids or made any trips to the emergency room or urgent care due to breathing problems.  She has not had to use her albuterol inhaler in the past couple months.  She does feel like her Fasenra injections have helped her asthma.  She denies any problems or reactions with her Fasenra injections. ? ? Perennial allergic rhinitis is reported as moderately controlled with fluticasone nasal spray as needed, Singulair 10 mg once a day, and Zyrtec 10 mg once a day as needed.  She reports yesterday that she went outside and her face became itchy and red and she lost her voice.  She went inside washed her face and the itchy/redness got better and her voice came back later last night.  She reports sneezing a lot yesterday and a little bit of postnasal drip.  She denies rhinorrhea and nasal congestion.  She has not had any sinus  infections since we last saw her. ? ? ?Drug Allergies:  ?Allergies  ?Allergen Reactions  ? Pineapple Other (See Comments)  ?  Mouth Swelling  ? Bacid Rash  ? ? ?Review of Systems: ?Review of Systems  ?Constitutional:  Negative for chills and fever.  ?HENT:    ?     Reports sneezing yesterday and a little postnasal drip.  Denies rhinorrhea and nasal congestion  ?Eyes:   ?     Denies itchy watery eyes  ?Respiratory:  Positive for shortness of breath. Negative for cough and wheezing.   ?     Reports tightness in her chest every now and then and shortness of breath with a lot of walking.  She reports this is not any worse since her last office visit.  Denies coughing, wheezing, and nocturnal awakenings due to breathing problems.  ?Cardiovascular:  Negative for chest pain and palpitations.  ?Gastrointestinal:   ?     Reports a little bit of heartburn at times for which she takes Nexium  ?Genitourinary:  Negative for frequency.  ?Skin:  Positive for itching. Negative for rash.  ?     She reports after being outside her face became itchy and red.  She denies any rashes.  She washed all of her face in the itching became better.  ?Neurological:  Negative for headaches.  ?Endo/Heme/Allergies:  Positive for environmental allergies.  ? ? ?Physical Exam: ?BP 120/76   Pulse  66   Temp 98.1 ?F (36.7 ?C)   Resp 16   Ht '5\' 1"'$  (1.549 m)   Wt 149 lb (67.6 kg)   SpO2 97%   BMI 28.15 kg/m?   ? ?Physical Exam ?Constitutional:   ?   Appearance: Normal appearance.  ?HENT:  ?   Head: Normocephalic and atraumatic.  ?   Comments: Pharynx normal, eyes normal, ears normal, nose: Bilateral lower turbinates moderately edematous and pale with clear drainage noted ?   Right Ear: Tympanic membrane, ear canal and external ear normal.  ?   Left Ear: Tympanic membrane, ear canal and external ear normal.  ?   Mouth/Throat:  ?   Mouth: Mucous membranes are moist.  ?   Pharynx: Oropharynx is clear.  ?Eyes:  ?   Conjunctiva/sclera: Conjunctivae  normal.  ?Cardiovascular:  ?   Rate and Rhythm: Normal rate and regular rhythm.  ?   Heart sounds: Normal heart sounds.  ?Pulmonary:  ?   Effort: Pulmonary effort is normal.  ?   Breath sounds: Normal breath sounds.  ?   Comments: Lungs clear to auscultation ?Musculoskeletal:  ?   Cervical back: Neck supple.  ?Skin: ?   General: Skin is warm.  ?Neurological:  ?   Mental Status: She is alert and oriented to person, place, and time.  ?Psychiatric:     ?   Mood and Affect: Mood normal.     ?   Behavior: Behavior normal.     ?   Thought Content: Thought content normal.     ?   Judgment: Judgment normal.  ? ? ?Diagnostics: ?FVC 2.51 L, FEV1 1.94 L (96%).  Predicted FVC 2.52 L, predicted FEV1 2.03 L.  Spirometry indicates normal respiratory function. ? ?Assessment and Plan: ?1. Severe persistent asthma without complication   ?2. Perennial allergic rhinitis   ? ? ?No orders of the defined types were placed in this encounter. ? ? ?Patient Instructions  ?1. Moderate persistent asthma, uncomplicated ?- Daily controller medication(s): Symbicort 160/4.53mg 2 puffs twice daily with spacer + Spiriva 1.234m 2 puffs once daily + Singulair '10mg'$  daily + Fasenra every 8 weeks ?- Rescue medications: albuterol 2 puffs every 4-6 hours as needed ?- Asthma control goals:  ?* Full participation in all desired activities (may need albuterol before activity) ?* Albuterol use two time or less a week on average (not counting use with activity) ?* Cough interfering with sleep two time or less a month ?* Oral steroids no more than once a year ?* No hospitalizations ? ?2. Chronic allergic rhinitis  ?- Continue with fluticasone two puffs once daily as needed for stuffy nose  ?- Continue with nasal saline rinses 1-2 times daily.  ?- Continue with Singulair '10mg'$  daily. ?-Continue Zyrtec 10 mg as needed for runny nose ? ?Please let usKoreanow if this treatment plan is  not working well for you. ?Schedule a follow up appointment in 4-6 months or sooner  if needed ? ? ? ?Return in about 6 months (around 08/11/2022), or if symptoms worsen or fail to improve. ?  ? ?Thank you for the opportunity to care for this patient.  Please do not hesitate to contact me with questions. ? ?ChAlthea CharonFNP ?[facility] ? ? ? ?

## 2022-02-22 ENCOUNTER — Ambulatory Visit (INDEPENDENT_AMBULATORY_CARE_PROVIDER_SITE_OTHER): Payer: BC Managed Care – PPO

## 2022-02-22 ENCOUNTER — Other Ambulatory Visit: Payer: Self-pay

## 2022-02-22 DIAGNOSIS — J4550 Severe persistent asthma, uncomplicated: Secondary | ICD-10-CM | POA: Diagnosis not present

## 2022-04-14 ENCOUNTER — Other Ambulatory Visit: Payer: Self-pay | Admitting: Allergy & Immunology

## 2022-04-19 ENCOUNTER — Ambulatory Visit (INDEPENDENT_AMBULATORY_CARE_PROVIDER_SITE_OTHER): Payer: BC Managed Care – PPO

## 2022-04-19 DIAGNOSIS — J4550 Severe persistent asthma, uncomplicated: Secondary | ICD-10-CM

## 2022-06-19 ENCOUNTER — Ambulatory Visit: Payer: BC Managed Care – PPO

## 2022-06-21 ENCOUNTER — Ambulatory Visit (INDEPENDENT_AMBULATORY_CARE_PROVIDER_SITE_OTHER): Payer: BC Managed Care – PPO

## 2022-06-21 DIAGNOSIS — J4550 Severe persistent asthma, uncomplicated: Secondary | ICD-10-CM

## 2022-07-11 NOTE — Patient Instructions (Incomplete)
1. Moderate persistent asthma, uncomplicated - Daily controller medication(s): Symbicort 160/4.15mg 2 puffs twice daily with spacer + Spiriva 1.264m 2 puffs once daily + Singulair '10mg'$  daily + Fasenra every 8 weeks - Rescue medications: albuterol 2 puffs every 4-6 hours as needed - Asthma control goals:  * Full participation in all desired activities (may need albuterol before activity) * Albuterol use two time or less a week on average (not counting use with activity) * Cough interfering with sleep two time or less a month * Oral steroids no more than once a year * No hospitalizations  2. perennial allergic rhinitis  - Continue with fluticasone two puffs once daily as needed for stuffy nose. Hold off on using this this for a few days due to the irritation (blood) noted in your left nostril. Reviewed proper technique - Continue with nasal saline rinses 1-2 times daily.  - Continue with Singulair '10mg'$  daily. -Continue Zyrtec 10 mg as needed for runny nose  3. Epistaxis Pinch both nostrils while leaning forward for at least 5 minutes before checking to see if the bleeding has stopped. If bleeding is not controlled within 5-10 minutes apply a cotton ball soaked with oxymetazoline (Afrin) to the bleeding nostril for a few seconds.  If the problem persists or worsens a referral to ENT for further evaluation may be necessary.  Please let usKoreanow if this treatment plan is  not working well for you. Schedule a follow up appointment in 3-4 months or sooner if needed

## 2022-07-12 ENCOUNTER — Encounter: Payer: Self-pay | Admitting: Family

## 2022-07-12 ENCOUNTER — Ambulatory Visit (INDEPENDENT_AMBULATORY_CARE_PROVIDER_SITE_OTHER): Payer: BC Managed Care – PPO | Admitting: Family

## 2022-07-12 VITALS — BP 150/80 | HR 66 | Temp 98.1°F | Resp 18 | Ht 65.0 in | Wt 155.0 lb

## 2022-07-12 DIAGNOSIS — J4550 Severe persistent asthma, uncomplicated: Secondary | ICD-10-CM

## 2022-07-12 DIAGNOSIS — R040 Epistaxis: Secondary | ICD-10-CM | POA: Diagnosis not present

## 2022-07-12 DIAGNOSIS — J3089 Other allergic rhinitis: Secondary | ICD-10-CM | POA: Diagnosis not present

## 2022-07-12 MED ORDER — ALBUTEROL SULFATE HFA 108 (90 BASE) MCG/ACT IN AERS: 2.0000 | INHALATION_SPRAY | Freq: Four times a day (QID) | RESPIRATORY_TRACT | 1 refills | Status: DC | PRN

## 2022-07-12 MED ORDER — SPIRIVA RESPIMAT 1.25 MCG/ACT IN AERS: INHALATION_SPRAY | RESPIRATORY_TRACT | 5 refills | Status: DC

## 2022-07-12 MED ORDER — BUDESONIDE-FORMOTEROL FUMARATE 160-4.5 MCG/ACT IN AERO: INHALATION_SPRAY | RESPIRATORY_TRACT | 5 refills | Status: DC

## 2022-07-12 MED ORDER — CETIRIZINE HCL 10 MG PO TABS: 10.0000 mg | ORAL_TABLET | Freq: Every day | ORAL | 5 refills | Status: DC | PRN

## 2022-07-12 MED ORDER — MONTELUKAST SODIUM 10 MG PO TABS: ORAL_TABLET | ORAL | 1 refills | Status: DC

## 2022-07-12 MED ORDER — FLUTICASONE PROPIONATE 50 MCG/ACT NA SUSP
NASAL | 5 refills | Status: DC
Start: 2022-07-12 — End: 2022-09-07

## 2022-07-12 NOTE — Progress Notes (Signed)
[address], [place] [address] Dept: [phone]  FOLLOW UP NOTE  Patient ID: Hannah Hays, female    DOB: 12-Mar-1966  Age: 56 y.o. MRN: [id number] Date of Office Visit: 07/12/2022  Assessment  Chief Complaint: Asthma (4-6 mth f/u - Good), Perennial Allergic rhinitis (Pretty Good), and Food Allergy (4-6 mth - Patient states she eats pineapple  - 1 slice every 2-3 mths )  HPI Hannah Hays is a 56 year old female who presents today for follow-up of severe persistent asthma and perennial allergic rhinitis.  She was last seen on February 08, 2022 by myself.  She denies any new diagnosis or surgery since her last office visit.  Severe persistent asthma: She continues to take Symbicort 160/4.5 mcg 2 puffs twice a day with spacer, Spiriva 1.25 mcg 2 puffs once a day, Singulair 10 mg once a day, and Fasenra injections every 8 weeks.  She does feel like her Fasenra injections have helped her asthma and she denies any problems or reactions with the [place].  She does report tightness in her chest at times, but not a lot.  She denies cough, wheeze, shortness of breath, nocturnal awakenings, fever, or chills.  Since her last office visit she has not required any systemic steroids or made any trips to the emergency room or urgent care due to breathing problems.  She has not had to use her albuterol inhaler since her last office visit.  Perennial allergic rhinitis: She continues to take fluticasone nasal spray as needed, Singulair 10 mg once a day, and Zyrtec 10 mg once a day as needed.  She reports a little bit of rhinorrhea while at work.  She feels like the spices cause the rhinorrhea.  She also mentions that Saturday night while at work she did have a nosebleed in her right nostril.  She denies nasal congestion and postnasal drip.  She has not had any sinus infections since we last saw her.  She continues to avoid pineapple without any accidental ingestion or use of her epinephrine  autoinjector device.  She does mention that her epinephrine autoinjector device is up-to-date.  She reports mild swelling with pineapple.   Drug Allergies:  Allergies  Allergen Reactions   Pineapple Other (See Comments)    Mouth Swelling   Bacid Rash    Review of Systems: Review of Systems  Constitutional:  Negative for chills and fever.  HENT:         Reports clear rhinorrhea at times.  Denies nasal congestion and postnasal drip.  She also does mention that Saturday while at work she did have a nosebleed from her right nostril.  Eyes:        Denies itchy watery eyes  Respiratory:         Reports a little bit of tightness in her chest.  Denies cough, wheeze, shortness of breath, and nocturnal awakenings due to breathing problems.  Cardiovascular:  Negative for chest pain and palpitations.  Gastrointestinal:        Denies heartburn or reflux symptoms  Genitourinary:  Negative for frequency.  Skin:  Negative for itching and rash.  Neurological:  Negative for headaches.  Endo/Heme/Allergies:  Positive for environmental allergies.     Physical Exam: BP (!) 150/80   Pulse 66   Temp 98.1 F (36.7 C)   Resp 18   Ht '5\' 5"'$  (1.651 m)   Wt 155 lb (70.3 kg)   SpO2 97%   BMI 25.79 kg/m  Physical Exam Constitutional:      Appearance: Normal appearance.  HENT:     Head: Normocephalic and atraumatic.     Comments: Pharynx normal, eyes normal, ears normal, nose: Bilateral lower turbinates moderately edematous and pale with clear drainage noted.  Irritation with a little bit of blood noted in left nostril.    Right Ear: Tympanic membrane, ear canal and external ear normal.     Left Ear: Tympanic membrane, ear canal and external ear normal.     Mouth/Throat:     Mouth: Mucous membranes are moist.     Pharynx: Oropharynx is clear.  Eyes:     Conjunctiva/sclera: Conjunctivae normal.  Cardiovascular:     Rate and Rhythm: Normal rate and regular rhythm.  Pulmonary:     Effort:  Pulmonary effort is normal.     Breath sounds: Normal breath sounds.     Comments: Lungs clear to auscultation Musculoskeletal:     Cervical back: Neck supple.  Skin:    General: Skin is warm.  Neurological:     Mental Status: She is alert and oriented to person, place, and time.  Psychiatric:        Mood and Affect: Mood normal.        Behavior: Behavior normal.        Thought Content: Thought content normal.        Judgment: Judgment normal.     Diagnostics: FVC 2.77 L (95%), FEV1 1.84 L (79%).  Spirometry indicates moderate airway obstruction.  Spirometry is decreased from previous spirometry.  Assessment and Plan: 1. Severe persistent asthma without complication   2. Perennial allergic rhinitis   3. Epistaxis     No orders of the defined types were placed in this encounter.   Patient Instructions  1. Moderate persistent asthma, uncomplicated - Daily controller medication(s): Symbicort 160/4.35mg 2 puffs twice daily with spacer + Spiriva 1.239m 2 puffs once daily + Singulair '10mg'$  daily + Fasenra every 8 weeks - Rescue medications: albuterol 2 puffs every 4-6 hours as needed - Asthma control goals:  * Full participation in all desired activities (may need albuterol before activity) * Albuterol use two time or less a week on average (not counting use with activity) * Cough interfering with sleep two time or less a month * Oral steroids no more than once a year * No hospitalizations  2. perennial allergic rhinitis  - Continue with fluticasone two puffs once daily as needed for stuffy nose. Hold off on using this this for a few days due to the irritation (blood) noted in your left nostril. Reviewed proper technique - Continue with nasal saline rinses 1-2 times daily.  - Continue with Singulair '10mg'$  daily. -Continue Zyrtec 10 mg as needed for runny nose  3. Epistaxis Pinch both nostrils while leaning forward for at least 5 minutes before checking to see if the bleeding  has stopped. If bleeding is not controlled within 5-10 minutes apply a cotton ball soaked with oxymetazoline (Afrin) to the bleeding nostril for a few seconds.  If the problem persists or worsens a referral to ENT for further evaluation may be necessary.  Please let usKoreanow if this treatment plan is  not working well for you. Schedule a follow up appointment in 3-4 months or sooner if needed   Return in about 3 months (around 10/12/2022), or if symptoms worsen or fail to improve.    Thank you for the opportunity to care for this patient.  Please do not  hesitate to contact me with questions.  Althea Charon, FNP [facility]

## 2022-07-17 ENCOUNTER — Ambulatory Visit (HOSPITAL_COMMUNITY)
Admission: RE | Admit: 2022-07-17 | Discharge: 2022-07-17 | Disposition: A | Discharge status: Home / Self Care | Payer: BC Managed Care – PPO | Source: Ambulatory Visit | Attending: Family Medicine | Admitting: Family Medicine

## 2022-07-17 DIAGNOSIS — Z1231 Encounter for screening mammogram for malignant neoplasm of breast: Secondary | ICD-10-CM | POA: Diagnosis not present

## 2022-07-26 ENCOUNTER — Ambulatory Visit (INDEPENDENT_AMBULATORY_CARE_PROVIDER_SITE_OTHER): Payer: BC Managed Care – PPO | Admitting: Family Medicine

## 2022-07-26 ENCOUNTER — Encounter: Payer: Self-pay | Admitting: Family Medicine

## 2022-07-26 VITALS — BP 170/80 | HR 55 | Resp 16 | Ht 65.0 in | Wt 151.0 lb

## 2022-07-26 DIAGNOSIS — J4551 Severe persistent asthma with (acute) exacerbation: Secondary | ICD-10-CM

## 2022-07-26 DIAGNOSIS — G43919 Migraine, unspecified, intractable, without status migrainosus: Secondary | ICD-10-CM

## 2022-07-26 DIAGNOSIS — E785 Hyperlipidemia, unspecified: Secondary | ICD-10-CM

## 2022-07-26 DIAGNOSIS — F5101 Primary insomnia: Secondary | ICD-10-CM

## 2022-07-26 DIAGNOSIS — I10 Essential (primary) hypertension: Secondary | ICD-10-CM

## 2022-07-26 DIAGNOSIS — R7302 Impaired glucose tolerance (oral): Secondary | ICD-10-CM | POA: Diagnosis not present

## 2022-07-26 DIAGNOSIS — E559 Vitamin D deficiency, unspecified: Secondary | ICD-10-CM

## 2022-07-26 DIAGNOSIS — J3089 Other allergic rhinitis: Secondary | ICD-10-CM

## 2022-07-26 MED ORDER — AMLODIPINE BESYLATE 10 MG PO TABS: 10.0000 mg | ORAL_TABLET | Freq: Every day | ORAL | 3 refills | Status: DC

## 2022-07-26 MED ORDER — SPIRONOLACTONE 50 MG PO TABS: 50.0000 mg | ORAL_TABLET | Freq: Every day | ORAL | 2 refills | Status: DC

## 2022-07-26 NOTE — Assessment & Plan Note (Signed)
Controlled, no change in medication  

## 2022-07-26 NOTE — Patient Instructions (Addendum)
F/U in lfirst week in [place], call if you need me sooner, eKG at visit, flu vaccine at visit  Increased dose of spironolactone to 50 mg daily  Labs today CBC, cmp and EGFR, TSH, HBA1C, and vit D  Blood pressure is too high, increase fresh and frozen vegetables and fruit  Thanks for choosing [facility], we consider it a privelige to serve you.

## 2022-07-26 NOTE — Assessment & Plan Note (Signed)
Uncontrolled , increase spironolactone dose and re eval in 5 to 6 weeks DASH diet and commitment to daily physical activity for a minimum of 30 minutes discussed and encouraged, as a part of hypertension management. The importance of attaining a healthy weight is also discussed.     07/26/2022    8:20 AM 07/26/2022    8:07 AM 07/26/2022    8:04 AM 07/12/2022   10:13 AM 02/08/2022    9:34 AM 01/26/2022    8:34 AM 01/26/2022    8:04 AM  BP/Weight  Systolic BP 601 658 006 349 [phone]  Diastolic BP 80 84 85 80 76 70 77  Wt. (Lbs)   151 155 149  146  BMI   25.13 kg/m2 25.79 kg/m2 28.15 kg/m2  24.3 kg/m2

## 2022-07-26 NOTE — Progress Notes (Signed)
   Hannah Hays     MRN: [id number]      DOB: Jan 07, 1966   HPI Hannah Hays is here for follow up and re-evaluation of chronic medical conditions, medication management and review of any available recent lab and radiology data.  Preventive health is updated, specifically  Cancer screening and Immunization.   Has recently had some elevated/ uncontrolled blood pressures despite healthy lifestyle, weight loss and avoiding salt. The PT denies any adverse reactions to current medications since the last visit.  s   ROS Denies recent fever or chills. Denies sinus pressure, nasal congestion, ear pain or sore throat. Denies chest congestion, productive cough or wheezing. Denies chest pains, palpitations and leg swelling Denies abdominal pain, nausea, vomiting,diarrhea or constipation.   Denies dysuria, frequency, hesitancy or incontinence. Denies joint pain, swelling and limitation in mobility. Denies headaches, seizures, numbness, or tingling. Denies depression, anxiety or insomnia. Denies skin break down or rash.   PE  BP (!) 170/84   Pulse (!) 55   Resp 16   Ht '5\' 5"'$  (1.651 m)   Wt 151 lb (68.5 kg)   SpO2 98%   BMI 25.13 kg/m   Patient alert and oriented and in no cardiopulmonary distress.  HEENT: No facial asymmetry, EOMI,     Neck supple .  Chest: Clear to auscultation bilaterally.  CVS: S1, S2 no murmurs, no S3.Regular rate.  ABD: Soft non tender.   Ext: No edema  MS: Adequate ROM spine, shoulders, hips and knees.  Skin: Intact, no ulcerations or rash noted.  Psych: Good eye contact, normal affect. Memory intact not anxious or depressed appearing.  CNS: CN 2-12 intact, power,  normal throughout.no focal deficits noted.   Assessment & Plan  HTN (hypertension) Uncontrolled , increase spironolactone dose and re eval in 5 to 6 weeks DASH diet and commitment to daily physical activity for a minimum of 30 minutes discussed and encouraged, as a part of hypertension  management. The importance of attaining a healthy weight is also discussed.     07/26/2022    8:20 AM 07/26/2022    8:07 AM 07/26/2022    8:04 AM 07/12/2022   10:13 AM 02/08/2022    9:34 AM 01/26/2022    8:34 AM 01/26/2022    8:04 AM  BP/Weight  Systolic BP 425 956 387 564 [phone]  Diastolic BP 80 84 85 80 76 70 77  Wt. (Lbs)   151 155 149  146  BMI   25.13 kg/m2 25.79 kg/m2 28.15 kg/m2  24.3 kg/m2       Asthma Controlled, no change in medication   Perennial allergic rhinitis Controlled, no change in medication   Insomnia Controlled, no change in medication Sleep hygiene reviewed and written information offered also. Prescription sent for  medication needed.   Migraine Controlled, no change in medication

## 2022-07-26 NOTE — Assessment & Plan Note (Signed)
Controlled, no change in medication Sleep hygiene reviewed and written information offered also. Prescription sent for  medication needed.  

## 2022-07-27 ENCOUNTER — Other Ambulatory Visit: Payer: Self-pay | Admitting: Family Medicine

## 2022-07-27 LAB — CMP14+EGFR
ALT: 19 IU/L (ref 0–32)
AST: 24 IU/L (ref 0–40)
Albumin/Globulin Ratio: 1.8 (ref 1.2–2.2)
Albumin: 4.7 g/dL (ref 3.8–4.9)
Alkaline Phosphatase: 97 IU/L (ref 44–121)
BUN/Creatinine Ratio: 15 (ref 9–23)
BUN: 12 mg/dL (ref 6–24)
Bilirubin Total: 0.3 mg/dL (ref 0.0–1.2)
CO2: 24 mmol/L (ref 20–29)
Calcium: 9.3 mg/dL (ref 8.7–10.2)
Chloride: 105 mmol/L (ref 96–106)
Creatinine, Ser: 0.8 mg/dL (ref 0.57–1.00)
Globulin, Total: 2.6 g/dL (ref 1.5–4.5)
Glucose: 94 mg/dL (ref 70–99)
Potassium: 4 mmol/L (ref 3.5–5.2)
Sodium: 144 mmol/L (ref 134–144)
Total Protein: 7.3 g/dL (ref 6.0–8.5)
eGFR: 86 mL/min/{1.73_m2} (ref >59)

## 2022-07-27 LAB — CBC
Hematocrit: 37 % (ref 34.0–46.6)
Hemoglobin: 12.8 g/dL (ref 11.1–15.9)
MCH: 28.6 pg (ref 26.6–33.0)
MCHC: 34.6 g/dL (ref 31.5–35.7)
MCV: 83 fL (ref 79–97)
Platelets: 154 10*3/uL (ref 150–450)
RBC: 4.47 x10E6/uL (ref 3.77–5.28)
RDW: 13.9 % (ref 11.7–15.4)
WBC: 4.5 10*3/uL (ref 3.4–10.8)

## 2022-07-27 LAB — HEMOGLOBIN A1C
Est. average glucose Bld gHb Est-mCnc: 128 mg/dL
Hgb A1c MFr Bld: 6.1 % — ABNORMAL HIGH (ref 4.8–5.6)

## 2022-07-27 LAB — VITAMIN D 25 HYDROXY (VIT D DEFICIENCY, FRACTURES): Vit D, 25-Hydroxy: 18.4 ng/mL — ABNORMAL LOW (ref 30.0–100.0)

## 2022-07-27 LAB — TSH: TSH: 1.16 u[IU]/mL (ref 0.450–4.500)

## 2022-07-27 NOTE — Progress Notes (Signed)
Vit d allergy

## 2022-08-18 ENCOUNTER — Ambulatory Visit (INDEPENDENT_AMBULATORY_CARE_PROVIDER_SITE_OTHER): Payer: BC Managed Care – PPO

## 2022-08-18 DIAGNOSIS — J4550 Severe persistent asthma, uncomplicated: Secondary | ICD-10-CM

## 2022-08-31 ENCOUNTER — Other Ambulatory Visit: Payer: Self-pay | Admitting: Family

## 2022-09-07 ENCOUNTER — Ambulatory Visit (INDEPENDENT_AMBULATORY_CARE_PROVIDER_SITE_OTHER): Payer: BC Managed Care – PPO | Admitting: Family Medicine

## 2022-09-07 ENCOUNTER — Encounter: Payer: Self-pay | Admitting: Family Medicine

## 2022-09-07 VITALS — BP 127/76 | HR 80 | Ht 65.0 in | Wt 151.0 lb

## 2022-09-07 DIAGNOSIS — I10 Essential (primary) hypertension: Secondary | ICD-10-CM

## 2022-09-07 DIAGNOSIS — R7302 Impaired glucose tolerance (oral): Secondary | ICD-10-CM

## 2022-09-07 DIAGNOSIS — R079 Chest pain, unspecified: Secondary | ICD-10-CM | POA: Diagnosis not present

## 2022-09-07 DIAGNOSIS — Z23 Encounter for immunization: Secondary | ICD-10-CM | POA: Diagnosis not present

## 2022-09-07 DIAGNOSIS — M25522 Pain in left elbow: Secondary | ICD-10-CM

## 2022-09-07 DIAGNOSIS — G8929 Other chronic pain: Secondary | ICD-10-CM | POA: Insufficient documentation

## 2022-09-07 DIAGNOSIS — E785 Hyperlipidemia, unspecified: Secondary | ICD-10-CM | POA: Diagnosis not present

## 2022-09-07 DIAGNOSIS — E559 Vitamin D deficiency, unspecified: Secondary | ICD-10-CM

## 2022-09-07 DIAGNOSIS — J4540 Moderate persistent asthma, uncomplicated: Secondary | ICD-10-CM

## 2022-09-07 NOTE — Progress Notes (Signed)
Hannah Hays     MRN: [id number]      DOB: 1966/04/14   HPI Hannah Hays is here for follow up and re-evaluation of chronic medical conditions, medication management and review of any available recent lab and radiology data.  Preventive health is updated, specifically  Cancer screening and Immunization.   Questions or concerns regarding consultations or procedures which the PT has had in the interim are  addressed. 3 year h./o intermittent left elbow pain .  In past 3 months increased frequency and severity, up to a 10, was radiating to 3 mid fingers which feel numb at times, in past 6 weeks up the arm and even into left chest, ocurs at rest and with activity, on avg up to 6 times per day, has even awakened her 3 times. Whiles driving has had episode which is scary as left hand function compromised when occring   ROS Denies recent fever or chills. Denies sinus pressure, nasal congestion, ear pain or sore throat. Denies chest congestion, productive cough or wheezing. C/o left chest pain, denies palpitations and leg swelling Denies abdominal pain, nausea, vomiting,diarrhea or constipation.   Denies dysuria, frequency, hesitancy or incontinence. Denies headaches, seizures, . Denies depression, anxiety or insomnia. Denies skin break down or rash.   PE  BP 127/76 (BP Location: Right Arm, Patient Position: Sitting)   Pulse 80   Ht '5\' 5"'$  (1.651 m)   Wt 151 lb (68.5 kg)   SpO2 95%   BMI 25.13 kg/m   Patient alert and oriented and in no cardiopulmonary distress.witnessed episode of significant pain lasting approx 2 months  HEENT: No facial asymmetry, EOMI,     Neck supple .  Chest: Clear to auscultation bilaterally.No reproducible chest wall pain  CVS: S1, S2 no murmurs, no S3.Regular rate. EKG; sinus bradycardia, and elevated st in anterior leads, non specific, no lVH ABD: Soft non tender.   Ext: No edema  MS: Adequate ROM spine, shoulders, hips and knees.Full ROM left elbow,  non tender   Skin: Intact, no ulcerations or rash noted.  Psych: Good eye contact, normal affect. Memory intact not anxious or depressed appearing.  CNS: CN 2-12 intact, power,  normal throughout.no focal deficits noted.   Assessment & Plan  HTN (hypertension) Controlled, no change in medication DASH diet and commitment to daily physical activity for a minimum of 30 minutes discussed and encouraged, as a part of hypertension management. The importance of attaining a healthy weight is also discussed.     09/07/2022    8:44 AM 07/26/2022    8:20 AM 07/26/2022    8:07 AM 07/26/2022    8:04 AM 07/12/2022   10:13 AM 02/08/2022    9:34 AM 01/26/2022    8:34 AM  BP/Weight  Systolic BP 419 379 024 097 353 299 242  Diastolic BP 76 80 84 85 80 76 70  Wt. (Lbs) 151   151 155 149   BMI 25.13 kg/m2   25.13 kg/m2 25.79 kg/m2 28.15 kg/m2        Chronic pain of left elbow 3 month history, increasing in frequency and severity, refer Ortho, radiating to hand, shoulder , left chest  Hyperlipidemia LDL goal <100 Hyperlipidemia:Low fat diet discussed and encouraged.   Lipid Panel  Lab Results  Component Value Date   CHOL 155 01/26/2022   HDL 48 01/26/2022   LDLCALC 87 01/26/2022   TRIG 111 01/26/2022   CHOLHDL 3.2 01/26/2022     Updated lab needed  at/ before next visit.   Chest pain, unspecified Episode of left chest and upper extremity pain x 1 , office EKG shows non specific t wave abnormalities wih possible ischemia, has hTN  Refer  cardiology  Moderate persistent asthma, uncomplicated Controlled, no change in medication   IGT (impaired glucose tolerance) Patient educated about the importance of limiting  Carbohydrate intake , the need to commit to daily physical activity for a minimum of 30 minutes , and to commit weight loss. The fact that changes in all these areas will reduce or eliminate all together the development of diabetes is stressed.  Updated lab needed at/ before  next visit.      Latest Ref Rng & Units 07/26/2022    8:42 AM 01/26/2022    8:44 AM 07/21/2021    8:44 AM 06/10/2021    8:42 AM 12/08/2020    8:18 AM  Diabetic Labs  HbA1c 4.8 - 5.6 % 6.1  6.3  6.2     Chol 100 - 199 mg/dL  155  133   156   HDL >39 mg/dL  48  52   53   Calc LDL 0 - 99 mg/dL  87  70   90   Triglycerides 0 - 149 mg/dL  111  49   67   Creatinine 0.57 - 1.00 mg/dL 0.80  0.87  0.97  1.07  0.86       09/07/2022    8:44 AM 07/26/2022    8:20 AM 07/26/2022    8:07 AM 07/26/2022    8:04 AM 07/12/2022   10:13 AM 02/08/2022    9:34 AM 01/26/2022    8:34 AM  BP/Weight  Systolic BP 093 [phone] 381  Diastolic BP 76 80 84 85 80 76 70  Wt. (Lbs) 151   151 155 149   BMI 25.13 kg/m2   25.13 kg/m2 25.79 kg/m2 28.15 kg/m2        No data to display

## 2022-09-07 NOTE — Assessment & Plan Note (Signed)
Controlled, no change in medication DASH diet and commitment to daily physical activity for a minimum of 30 minutes discussed and encouraged, as a part of hypertension management. The importance of attaining a healthy weight is also discussed.     09/07/2022    8:44 AM 07/26/2022    8:20 AM 07/26/2022    8:07 AM 07/26/2022    8:04 AM 07/12/2022   10:13 AM 02/08/2022    9:34 AM 01/26/2022    8:34 AM  BP/Weight  Systolic BP 295 621 308 657 [phone]  Diastolic BP 76 80 84 85 80 76 70  Wt. (Lbs) 151   151 155 149   BMI 25.13 kg/m2   25.13 kg/m2 25.79 kg/m2 28.15 kg/m2

## 2022-09-07 NOTE — Assessment & Plan Note (Addendum)
3 month history, increasing in frequency and severity, refer Ortho, radiating to hand, shoulder , left chest

## 2022-09-07 NOTE — Patient Instructions (Addendum)
F/u end Feb, call if you need me sooner  Flu vaccine today  Stop Remeron  You are referred to Orthopedics re left elbow pain x 3 months.  EkG today due to c/o chest pain, and you are being referred to Cardiology as eKG shows some abnormality  hBA1C, fasting lipid, cmp and eGFr and Vit D 5 to 7 days before Feb appt  It is important that you exercise regularly at least 30 minutes 5 times a week. If you develop chest pain, have severe difficulty breathing, or feel very tired, stop exercising immediately and seek medical attention  Think about what you will eat, plan ahead. Choose " clean, green, fresh or frozen" over canned, processed or packaged foods which are more sugary, salty and fatty. 70 to 75% of food eaten should be vegetables and fruit. Three meals at set times with snacks allowed between meals, but they must be fruit or vegetables. Aim to eat over a 12 hour period , example 7 am to 7 pm, and STOP after  your last meal of the day. Drink water,generally about 64 ounces per day, no other drink is as healthy. Fruit juice is best enjoyed in a healthy way, by EATING the fruit. Thanks for choosing [facility], we consider it a privelige to serve you.

## 2022-09-08 ENCOUNTER — Encounter: Payer: Self-pay | Admitting: Family Medicine

## 2022-09-08 DIAGNOSIS — R079 Chest pain, unspecified: Secondary | ICD-10-CM | POA: Insufficient documentation

## 2022-09-08 NOTE — Assessment & Plan Note (Signed)
Controlled, no change in medication  

## 2022-09-08 NOTE — Assessment & Plan Note (Signed)
Patient educated about the importance of limiting  Carbohydrate intake , the need to commit to daily physical activity for a minimum of 30 minutes , and to commit weight loss. The fact that changes in all these areas will reduce or eliminate all together the development of diabetes is stressed.  Updated lab needed at/ before next visit.      Latest Ref Rng & Units 07/26/2022    8:42 AM 01/26/2022    8:44 AM 07/21/2021    8:44 AM 06/10/2021    8:42 AM 12/08/2020    8:18 AM  Diabetic Labs  HbA1c 4.8 - 5.6 % 6.1  6.3  6.2     Chol 100 - 199 mg/dL  155  133   156   HDL >39 mg/dL  48  52   53   Calc LDL 0 - 99 mg/dL  87  70   90   Triglycerides 0 - 149 mg/dL  111  49   67   Creatinine 0.57 - 1.00 mg/dL 0.80  0.87  0.97  1.07  0.86       09/07/2022    8:44 AM 07/26/2022    8:20 AM 07/26/2022    8:07 AM 07/26/2022    8:04 AM 07/12/2022   10:13 AM 02/08/2022    9:34 AM 01/26/2022    8:34 AM  BP/Weight  Systolic BP 111 552 080 223 361 224 497  Diastolic BP 76 80 84 85 80 76 70  Wt. (Lbs) 151   151 155 149   BMI 25.13 kg/m2   25.13 kg/m2 25.79 kg/m2 28.15 kg/m2        No data to display

## 2022-09-08 NOTE — Assessment & Plan Note (Signed)
Episode of left chest and upper extremity pain x 1 , office EKG shows non specific t wave abnormalities wih possible ischemia, has hTN  Refer  cardiology

## 2022-09-08 NOTE — Assessment & Plan Note (Signed)
Hyperlipidemia:Low fat diet discussed and encouraged.   Lipid Panel  Lab Results  Component Value Date   CHOL 155 01/26/2022   HDL 48 01/26/2022   LDLCALC 87 01/26/2022   TRIG 111 01/26/2022   CHOLHDL 3.2 01/26/2022     Updated lab needed at/ before next visit.

## 2022-09-12 ENCOUNTER — Encounter: Payer: Self-pay | Admitting: Orthopedic Surgery

## 2022-09-12 ENCOUNTER — Ambulatory Visit (INDEPENDENT_AMBULATORY_CARE_PROVIDER_SITE_OTHER): Payer: BC Managed Care – PPO | Admitting: Orthopedic Surgery

## 2022-09-12 VITALS — BP 134/78 | HR 80 | Ht 65.0 in | Wt 149.0 lb

## 2022-09-12 DIAGNOSIS — R200 Anesthesia of skin: Secondary | ICD-10-CM | POA: Diagnosis not present

## 2022-09-12 DIAGNOSIS — R202 Paresthesia of skin: Secondary | ICD-10-CM

## 2022-09-12 MED ORDER — GABAPENTIN 100 MG PO CAPS: 100.0000 mg | ORAL_CAPSULE | Freq: Three times a day (TID) | ORAL | 0 refills | Status: DC

## 2022-09-12 MED ORDER — PREDNISONE 10 MG (21) PO TBPK: ORAL_TABLET | ORAL | 0 refills | Status: DC

## 2022-09-12 NOTE — Progress Notes (Signed)
New Patient Visit  Assessment: Hannah Hays is a 56 y.o. female with the following: 1. Numbness and tingling in left arm  Plan: Hannah Hays has numbness and tingling into the left hand, with radiating pains up into her shoulder.  She notes some shooting pains into the left arm when she rotates her head to the left.  Most likely the cause of her symptoms at this time is irritation of nerves in the neck area.  I provided a prescription for prednisone, as well as gabapentin.  I would like to see her back in approximately 4 weeks for repeat evaluation.  We can consider a physical therapy referral at that time.  Follow-up: Return in about 4 weeks (around 10/10/2022).  Subjective:  Chief Complaint  Patient presents with   Elbow Pain    L elbow into the fingers for 3 mos and up into shoulder for 2 wks.     History of Present Illness: Hannah Hays is a 56 y.o. female who has been referred by Tula Nakayama, MD for evaluation of elbow pain.  She states that she has had pain in the left elbow for a while.  Over the past 3 months, she started to experience some radiating pains, numbness and tingling into the left hand.  More recently, she states the radiating symptoms are into her left shoulder.  No specific injury.  She has not hurt her neck.  No issues with her left elbow.  There is no tenderness to palpation according to her.  Over-the-counter medications have not been helpful.  She has not worked with physical therapy.   Review of Systems: No fevers or chills + numbness or tingling No chest pain No shortness of breath No bowel or bladder dysfunction No GI distress No headaches   Medical History:  Past Medical History:  Diagnosis Date   Allergic rhinitis, seasonal    Anxiety    Asthma    Depression    GERD (gastroesophageal reflux disease)    Helicobacter pylori gastritis 12/05/2007   s/p Prevpac treatment   History of colonoscopy 12/04/2006   with simple adenoma    Hypertension    IBS (irritable bowel syndrome)    pre-dominant diarrhea   Migraine headache 17   recurrence 1st time in 10 year in 2011    Past Surgical History:  Procedure Laterality Date   ABDOMINAL HYSTERECTOMY     CHOLECYSTECTOMY     COLONOSCOPY  11/13/2007   SLF:A 4 mm sessile transverse colon polyp/Random biopsies obtained throughout the colon/Otherwise normal. Negative for microscopic colitis, simple adenoma, due for surveillance 11/2017 per [facility]   COLONOSCOPY N/A 02/02/2016   Procedure: COLONOSCOPY;  Surgeon: Danie Binder, MD;  [facility]: [facility];  Service: Endoscopy;  Laterality: N/A;  12:15 PM   COLONOSCOPY WITH PROPOFOL N/A 06/14/2021   Procedure: COLONOSCOPY WITH PROPOFOL;  Surgeon: Eloise Harman, DO;  Location: [facility];  Service: Endoscopy;  Laterality: N/A;  ASA II / 12:45   ESOPHAGOGASTRODUODENOSCOPY  03/20/2008   SLF:A 1- to 2-cm hiatal hernia, mild erythema in the antrum/Normal esophagus without evidence of Barrett's, +H.pylori s/p Prevapc treatment    ESOPHAGOGASTRODUODENOSCOPY (EGD) WITH ESOPHAGEAL DILATION N/A 02/14/2013   XBD:ZHGDJM Esophageal web/MILD Non-erosive gastritis (inflammation)   PARTIAL HYSTERECTOMY  2002   SEPTOPLASTY N/A 08/16/2015   Procedure: SEPTOPLASTY;  Surgeon: Leta Baptist, MD;  Location: [facility];  Service: ENT;  Laterality: N/A;   SINUS ENDO WITH FUSION Bilateral 08/16/2015  Procedure: ETHMOIDECTOMY, SPHENOIDECTOMY, MAXILLARY ANTROSTOMY, FRONTAL RECESS EXPLORATION WITH FUSION NAVIGATION;  Surgeon: Leta Baptist, MD;  Location: [facility];  Service: ENT;  Laterality: Bilateral;   TUBAL LIGATION  1995    Family History  Problem Relation Age of Onset   Hypertension Mother    Heart disease Mother    Diabetes Mother    Alcohol abuse Father    Hypertension Father    Stroke Father    Hypertension Sister    Kidney disease Sister    Diabetes Sister    Hypertension Sister    Diabetes Sister    Diabetes Brother     Heart disease Brother    Hypertension Brother    Colon cancer Neg Hx    Social History   Tobacco Use   Smoking status: Never    Passive exposure: Never   Smokeless tobacco: Never  Vaping Use   Vaping Use: Never used  Substance Use Topics   Alcohol use: No   Drug use: No    Allergies  Allergen Reactions   Pineapple Other (See Comments)    Mouth Swelling   Bacid Rash    Current Meds  Medication Sig   gabapentin (NEURONTIN) 100 MG capsule Take 1 capsule (100 mg total) by mouth 3 (three) times daily.   predniSONE (STERAPRED UNI-PAK 21 TAB) 10 MG (21) TBPK tablet 10 mg DS 12 as directed   Current Facility-Administered Medications for the 09/12/22 encounter (Office Visit) with Mordecai Rasmussen, MD  Medication   Benralizumab SOSY 30 mg    Objective: BP 134/78   Pulse 80   Ht '5\' 5"'$  (1.651 m)   Wt 149 lb (67.6 kg)   BMI 24.79 kg/m   Physical Exam:  General: Alert and oriented. and No acute distress. Gait: Normal gait.  Evaluation of bilateral upper extremities demonstrates no atrophy.  She has good range of motion and strength in the left upper extremity.  Negative Tinel's at the cubital tunnel.  Negative Tinel's at the carpal tunnel.  Negative cubital tunnel compression.  Negative carpal tunnel compression.  Sensation is currently intact throughout the left hand.  Fingers are warm and well-perfused.  Full extension of the neck.  Full flexion.  Radiating pains with rotation of the neck to the left.   IMAGING: No new imaging obtained today   New Medications:  Meds ordered this encounter  Medications   gabapentin (NEURONTIN) 100 MG capsule    Sig: Take 1 capsule (100 mg total) by mouth 3 (three) times daily.    Dispense:  90 capsule    Refill:  0   predniSONE (STERAPRED UNI-PAK 21 TAB) 10 MG (21) TBPK tablet    Sig: 10 mg DS 12 as directed    Dispense:  48 tablet    Refill:  0      Mordecai Rasmussen, MD  09/12/2022 9:31 PM

## 2022-09-28 DIAGNOSIS — J4550 Severe persistent asthma, uncomplicated: Secondary | ICD-10-CM | POA: Diagnosis not present

## 2022-10-10 ENCOUNTER — Encounter: Payer: Self-pay | Admitting: Orthopedic Surgery

## 2022-10-10 ENCOUNTER — Ambulatory Visit (INDEPENDENT_AMBULATORY_CARE_PROVIDER_SITE_OTHER): Payer: BC Managed Care – PPO | Admitting: Orthopedic Surgery

## 2022-10-10 VITALS — Ht 65.0 in | Wt 149.0 lb

## 2022-10-10 DIAGNOSIS — R202 Paresthesia of skin: Secondary | ICD-10-CM | POA: Diagnosis not present

## 2022-10-10 DIAGNOSIS — R200 Anesthesia of skin: Secondary | ICD-10-CM | POA: Diagnosis not present

## 2022-10-10 NOTE — Patient Instructions (Signed)
 Cervical Strain and Sprain Rehab You have pain and stiffness in your neck.  The muscles around your neck are irritated.  Recommend using heat (heating pad, or hot water in the shower) to warm up the affected muscles.  Then proceed with stretching and strengthening.  Do not stretch until it hurts, but you should feel a pull.  With each exercise, you should be able to stretch a little bit further.  Attempting these exercises daily, or on a regular basis, can improve your symptoms over time.  Do not expect immediate, sustained improvement.  But, it will make your symptoms better over time.   Ask your health care provider which exercises are safe for you. Do exercises exactly as told by your health care provider and adjust them as directed. It is normal to feel mild stretching, pulling, tightness, or discomfort as you do these exercises. Stop right away if you feel sudden pain or your pain gets worse. Do not begin these exercises until told by your health care provider. Stretching and range-of-motion exercises Cervical side bending  Using good posture, sit on a stable chair or stand up. Without moving your shoulders, slowly tilt your left / right ear to your shoulder until you feel a stretch in the opposite side neck muscles. You should be looking straight ahead. Hold for 10 seconds. Repeat with the other side of your neck. Repeat 10 times. Complete this exercise 1-2 times a day. Cervical rotation  Using good posture, sit on a stable chair or stand up. Slowly turn your head to the side as if you are looking over your left / right shoulder. Keep your eyes level with the ground. Stop when you feel a stretch along the side and the back of your neck. Hold for 10 seconds. Repeat this by turning to your other side. Repeat 10 times. Complete this exercise 1-2 times a day. Thoracic extension and pectoral stretch Roll a towel or a small blanket so it is about 4 inches (10 cm) in diameter. Lie down on  your back on a firm surface. Put the towel lengthwise, under your spine in the middle of your back. It should not be under your shoulder blades. The towel should line up with your spine from your middle back to your lower back. Put your hands behind your head and let your elbows fall out to your sides. Hold for 10 seconds. Repeat 10 times. Complete this exercise 1-2 times a day. Strengthening exercises Isometric upper cervical flexion Lie on your back with a thin pillow behind your head and a small rolled-up towel under your neck. Gently tuck your chin toward your chest and nod your head down to look toward your feet. Do not lift your head off the pillow. Hold for 10 seconds. Release the tension slowly. Relax your neck muscles completely before you repeat this exercise. Repeat 10 times. Complete this exercise 1-2 times a day. Isometric cervical extension  Stand about 6 inches (15 cm) away from a wall, with your back facing the wall. Place a soft object, about 6-8 inches (15-20 cm) in diameter, between the back of your head and the wall. A soft object could be a small pillow, a ball, or a folded towel. Gently tilt your head back and press into the soft object. Keep your jaw and forehead relaxed. Hold for 10 seconds. Release the tension slowly. Relax your neck muscles completely before you repeat this exercise. Repeat 10 times. Complete this exercise 1-2 times a day. Posture   and body mechanics Body mechanics refers to the movements and positions of your body while you do your daily activities. Posture is part of body mechanics. Good posture and healthy body mechanics can help to relieve stress in your body's tissues and joints. Good posture means that your spine is in its natural S-curve position (your spine is neutral), your shoulders are pulled back slightly, and your head is not tipped forward. The following are general guidelines for applying improved posture and body mechanics to your  everyday activities. Sitting  When sitting, keep your spine neutral and keep your feet flat on the floor. Use a footrest, if necessary, and keep your thighs parallel to the floor. Avoid rounding your shoulders, and avoid tilting your head forward. When working at a desk or a computer, keep your desk at a height where your hands are slightly lower than your elbows. Slide your chair under your desk so you are close enough to maintain good posture. When working at a computer, place your monitor at a height where you are looking straight ahead and you do not have to tilt your head forward or downward to look at the screen. Standing  When standing, keep your spine neutral and keep your feet about hip-width apart. Keep a slight bend in your knees. Your ears, shoulders, and hips should line up. When you do a task in which you stand in one place for a long time, place one foot up on a stable object that is 2-4 inches (5-10 cm) high, such as a footstool. This helps keep your spine neutral. Resting When lying down and resting, avoid positions that are most painful for you. Try to support your neck in a neutral position. You can use a contour pillow or a small rolled-up towel. Your pillow should support your neck but not push on it. This information is not intended to replace advice given to you by your health care provider. Make sure you discuss any questions you have with your health care provider. Document Revised: 03/12/2019 Document Reviewed: 08/21/2018 Elsevier Patient Education  2022 [employer]  

## 2022-10-10 NOTE — Progress Notes (Signed)
New Patient Visit  Assessment: Hannah Hays is a 56 y.o. female with the following: 1. Numbness and tingling in left arm  Plan: Hannah Hays notes improvement in her symptoms.  However, she continues to have some numbness and tingling radiating from her left elbow into her left hand.  The prednisone was effective.  Gabapentin continues to help with her symptoms.  We discussed multiple treatment options, and she would like to defer physical therapy for now.  She will continue taking gabapentin.  I recommended a period of regular ibuprofen use to see if this helps.  I provided her with some simple neck exercises.  She is aware that if her symptoms do not improve, the next step is formal physical therapy.  She states her understanding.  She will follow-up as needed.  Follow-up: Return if symptoms worsen or fail to improve.  Subjective:  Chief Complaint  Patient presents with   Left Elbow - Follow-up    Pain isn't as bad but still there    History of Present Illness: Hannah Hays is a 56 y.o. female who returns to clinic today for repeat evaluation of left arm pain.  I saw her 1 month ago, at which time she started prednisone, and gabapentin.  Her symptoms have improved.  However, she continues to have numbness and tingling radiating from her left elbow into her left hand.  The numbness is not constant.  She has not done physical therapy.  She is not interested in therapy at this time, she reports she is very busy otherwise.  Review of Systems: No fevers or chills + numbness or tingling No chest pain No shortness of breath No bowel or bladder dysfunction No GI distress No headaches  Objective: Ht '5\' 5"'$  (1.651 m)   Wt 149 lb (67.6 kg)   BMI 24.79 kg/m   Physical Exam:  General: Alert and oriented. and No acute distress. Gait: Normal gait.  Evaluation of bilateral upper extremities demonstrates no atrophy.  She has good range of motion and strength in the left upper  extremity.  Negative Tinel's at the cubital tunnel.  Negative Tinel's at the carpal tunnel.  Negative cubital tunnel compression.  Negative carpal tunnel compression.  Sensation is currently intact throughout the left hand.  Fingers are warm and well-perfused.  Full extension of the neck.  Full flexion.  Radiating pains with rotation of the neck to the left.   IMAGING: No new imaging obtained today   New Medications:  No orders of the defined types were placed in this encounter.     Mordecai Rasmussen, MD  10/10/2022 8:44 AM

## 2022-10-11 ENCOUNTER — Ambulatory Visit (INDEPENDENT_AMBULATORY_CARE_PROVIDER_SITE_OTHER): Payer: BC Managed Care – PPO

## 2022-10-11 DIAGNOSIS — J4550 Severe persistent asthma, uncomplicated: Secondary | ICD-10-CM

## 2022-10-14 ENCOUNTER — Other Ambulatory Visit: Payer: Self-pay | Admitting: Orthopedic Surgery

## 2022-10-21 ENCOUNTER — Other Ambulatory Visit: Payer: Self-pay | Admitting: Family Medicine

## 2022-10-24 NOTE — Patient Instructions (Incomplete)
1. Moderate persistent asthma, uncomplicated - Daily controller medication(s): Symbicort 160/4.23mg 2 puffs twice daily with spacer + Spiriva 1.280m 2 puffs once daily + Singulair '10mg'$  daily + Fasenra every 8 weeks - Rescue medications: albuterol 2 puffs every 4-6 hours as needed - Asthma control goals:  * Full participation in all desired activities (may need albuterol before activity) * Albuterol use two time or less a week on average (not counting use with activity) * Cough interfering with sleep two time or less a month * Oral steroids no more than once a year * No hospitalizations  2. perennial allergic rhinitis  - Continue with fluticasone two puffs once daily as needed for stuffy nose.   - Continue with nasal saline rinses 1-2 times daily.  - Continue with Singulair '10mg'$  daily. -Continue Zyrtec 10 mg as needed for runny nose  3. Epistaxis Pinch both nostrils while leaning forward for at least 5 minutes before checking to see if the bleeding has stopped. If bleeding is not controlled within 5-10 minutes apply a cotton ball soaked with oxymetazoline (Afrin) to the bleeding nostril for a few seconds.  If the problem persists or worsens a referral to ENT for further evaluation may be necessary.  Please let usKoreanow if this treatment plan is  not working well for you. Schedule a follow up appointment in 4-6 months or sooner if needed

## 2022-10-25 ENCOUNTER — Encounter: Payer: Self-pay | Admitting: Family

## 2022-10-25 ENCOUNTER — Ambulatory Visit (INDEPENDENT_AMBULATORY_CARE_PROVIDER_SITE_OTHER): Payer: BC Managed Care – PPO | Admitting: Family

## 2022-10-25 ENCOUNTER — Ambulatory Visit: Payer: BC Managed Care – PPO | Admitting: Family

## 2022-10-25 VITALS — BP 138/70 | HR 65 | Temp 97.9°F | Resp 16 | Wt 154.5 lb

## 2022-10-25 DIAGNOSIS — J4550 Severe persistent asthma, uncomplicated: Secondary | ICD-10-CM | POA: Diagnosis not present

## 2022-10-25 DIAGNOSIS — R040 Epistaxis: Secondary | ICD-10-CM | POA: Diagnosis not present

## 2022-10-25 DIAGNOSIS — J3089 Other allergic rhinitis: Secondary | ICD-10-CM | POA: Diagnosis not present

## 2022-10-25 MED ORDER — EPINEPHRINE 0.3 MG/0.3ML IJ SOAJ: 0.3000 mg | INTRAMUSCULAR | 1 refills | Status: DC | PRN

## 2022-10-25 NOTE — Addendum Note (Signed)
Addended by: Norville Haggard on: 10/25/2022 05:11 PM   Modules accepted: Orders

## 2022-10-25 NOTE — Progress Notes (Signed)
[place], [place] [address] Dept: [phone]  FOLLOW UP NOTE  Patient ID: Hannah Hays, female    DOB: 03-Jul-1966  Age: 56 y.o. MRN: [id number] Date of Office Visit: 10/25/2022  Assessment  Chief Complaint: Asthma (Used alb 1-2x since she was last seen ) and Allergic Rhinitis  (Allergies are doing okay. She had a flare up with the weather change. )  HPI Hannah Hays is a 56 year old female who presents today for follow-up of severe persistent asthma, perennial allergic rhinitis, and epistaxis.  She was last seen on July 12, 2022 by myself.  She reports for the past 3 months she has been having problems with tingling in her left arm.  She did receive round of steroids for this.  Moderate persistent asthma: She reports not much coughing or wheezing.  She did have 1 little episode of shortness of breath and allergy flare with a weather change.  She denies tightness in chest, nocturnal awakenings due to breathing problems, fever, and chills.  Since her last office visit she has not required any systemic steroids due to breathing problems and has not made any trips to the emergency room or urgent care due to breathing problems.  She continues to take Symbicort 160/4.5 mcg 2 puffs twice a day with a spacer, Spiriva 1.25 mcg 2 puffs once a day, Singulair 10 mg once a day, and Fasenra injections every 8 weeks.  She denies any problems or reactions with her Fasenra injections.  She has used her albuterol inhaler 1-2 times since her last office visit  Perennial allergic rhinitis: She tries to use her fluticasone nasal spray twice a week and will also uses Zyrtec 10 mg as needed.  She does take Singulair 10 mg daily.  She reports rhinorrhea and nasal congestion on occasion.  She denies postnasal drip.  She has not had any sinus infections since we last saw her.  She did have an allergy flare with the weather changing.  She had a little bit of nasal congestion and shortness of  breath that was not real bad.  She has not had any episodes of epistaxis since her last office visit.  She continues to avoid large amounts of pineapple.  She does eat a little bit of pineapple in a salad and does not have any problems.  She does mention that her EpiPen is up-to-date and she has not had to use it since her last office visit.  Her reaction to pineapple first occurred when she was in [place] and caused swelling of the mouth.  She then tried pineapple again when she was back home and she had the same reaction.     Drug Allergies:  Allergies  Allergen Reactions   Pineapple Other (See Comments)    Mouth Swelling   Bacid Rash    Review of Systems: Review of Systems  Constitutional:  Negative for chills and fever.  HENT:         Reports rhinorrhea, nasal congestion, and postnasal drip at times.    Eyes:        Denies itchy watery eyes  Respiratory:  Positive for cough, shortness of breath and wheezing.        Reports not much coughing and wheezing.  She did have 1 episode of shortness of breath with the weather changing.  She denies tightness in her chest and nocturnal awakenings due to breathing problems  Cardiovascular:  Negative for chest pain and palpitations.  Gastrointestinal:  Reports reflux depending on her diet.  She does take Nexium as needed  Genitourinary:  Negative for frequency.  Skin:  Negative for itching and rash.  Neurological:  Negative for headaches.  Endo/Heme/Allergies:  Positive for environmental allergies.     Physical Exam: BP 138/70   Pulse 65   Temp 97.9 F (36.6 C)   Resp 16   Wt 154 lb 8 oz (70.1 kg)   SpO2 96%   BMI 25.71 kg/m    Physical Exam Constitutional:      Appearance: Normal appearance.  HENT:     Head: Normocephalic and atraumatic.     Comments: Pharynx normal, eyes normal, ears normal, nose normal    Right Ear: Tympanic membrane, ear canal and external ear normal.     Left Ear: Tympanic membrane, ear canal and  external ear normal.     Nose: Nose normal.     Mouth/Throat:     Mouth: Mucous membranes are moist.     Pharynx: Oropharynx is clear.  Eyes:     Conjunctiva/sclera: Conjunctivae normal.  Cardiovascular:     Rate and Rhythm: Normal rate and regular rhythm.     Heart sounds: Normal heart sounds.  Pulmonary:     Effort: Pulmonary effort is normal.     Breath sounds: Normal breath sounds.     Comments: Lungs clear to auscultation Musculoskeletal:     Cervical back: Neck supple.  Skin:    General: Skin is warm.  Neurological:     Mental Status: She is alert and oriented to person, place, and time.  Psychiatric:        Mood and Affect: Mood normal.        Behavior: Behavior normal.        Thought Content: Thought content normal.        Judgment: Judgment normal.     Diagnostics: FVC 2.19 L (76%), FEV1 1.54 L (67%).  Predicted FVC 2.89 L, predicted FEV1 2.30 L.  Spirometry indicates normal respiratory function.  Assessment and Plan: 1. Severe persistent asthma without complication   2. Perennial allergic rhinitis   3. Epistaxis     No orders of the defined types were placed in this encounter.   Patient Instructions  1. Moderate persistent asthma, uncomplicated - Daily controller medication(s): Symbicort 160/4.45mg 2 puffs twice daily with spacer + Spiriva 1.291m 2 puffs once daily + Singulair '10mg'$  daily + Fasenra every 8 weeks - Rescue medications: albuterol 2 puffs every 4-6 hours as needed - Asthma control goals:  * Full participation in all desired activities (may need albuterol before activity) * Albuterol use two time or less a week on average (not counting use with activity) * Cough interfering with sleep two time or less a month * Oral steroids no more than once a year * No hospitalizations  2. perennial allergic rhinitis  - Continue with fluticasone two puffs once daily as needed for stuffy nose.   - Continue with nasal saline rinses 1-2 times daily.  -  Continue with Singulair '10mg'$  daily. -Continue Zyrtec 10 mg as needed for runny nose  3. Epistaxis Pinch both nostrils while leaning forward for at least 5 minutes before checking to see if the bleeding has stopped. If bleeding is not controlled within 5-10 minutes apply a cotton ball soaked with oxymetazoline (Afrin) to the bleeding nostril for a few seconds.  If the problem persists or worsens a referral to ENT for further evaluation may be necessary.  Please let  us know if this treatment plan is  not working well for you. Schedule a follow up appointment in 4-6 months or sooner if needed  Return in about 4 months (around 02/23/2023), or if symptoms worsen or fail to improve.    Thank you for the opportunity to care for this patient.  Please do not hesitate to contact me with questions.  Althea Charon, FNP [facility]

## 2022-11-15 ENCOUNTER — Encounter: Payer: Self-pay | Admitting: Cardiology

## 2022-11-15 ENCOUNTER — Ambulatory Visit: Payer: BC Managed Care – PPO | Attending: Cardiology | Admitting: Cardiology

## 2022-11-15 VITALS — BP 130/80 | HR 71 | Ht 65.0 in | Wt 152.8 lb

## 2022-11-15 DIAGNOSIS — R0789 Other chest pain: Secondary | ICD-10-CM

## 2022-11-15 DIAGNOSIS — R011 Cardiac murmur, unspecified: Secondary | ICD-10-CM | POA: Diagnosis not present

## 2022-11-15 NOTE — Patient Instructions (Signed)
Medication Instructions:  Your physician recommends that you continue on your current medications as directed. Please refer to the Current Medication list given to you today.   Labwork: None  Testing/Procedures: Your physician has requested that you have an echocardiogram. Echocardiography is a painless test that uses sound waves to create images of your heart. It provides your doctor with information about the size and shape of your heart and how well your heart's chambers and valves are working. This procedure takes approximately one hour. There are no restrictions for this procedure. Please do NOT wear cologne, perfume, aftershave, or lotions (deodorant is allowed). Please arrive 15 minutes prior to your appointment time.   Follow-Up: Follow up is pending test results.   Any Other Special Instructions Will Be Listed Below (If Applicable).     If you need a refill on your cardiac medications before your next appointment, please call your pharmacy.

## 2022-11-15 NOTE — Progress Notes (Signed)
Clinical Summary Hannah Hays is a 56 y.o.female seen today as a new patient for the following medical problems.    1.Chest pain - EKG by pcp 09/2022 SR, diffuse TWIs - tightness across upper chest, tingling left arm and down side. Lasts for < 1 minute. Can occur at rest or with activity. No SOB/DOE. Not positional - was occurring 1-2 time per week, now 6-7 times per day.  CAD risk factors: HTN, borderline DM2, father MI in his 58s and 2 strokes, brother died CHF 8 unknown etiology. Sister stent placed age 34, younger sister  - walks up to 1 mile daily.  - does regular walking, carrying at work.   2.Asthma  3.HTN  4. Hyperlipidemia   SH: works as Scientist, clinical (histocompatibility and immunogenetics) at [employer].   Past Medical History:  Diagnosis Date   Allergic rhinitis, seasonal    Anxiety    Asthma    Depression    GERD (gastroesophageal reflux disease)    Helicobacter pylori gastritis 12/05/2007   s/p Prevpac treatment   History of colonoscopy 12/04/2006   with simple adenoma   Hypertension    IBS (irritable bowel syndrome)    pre-dominant diarrhea   Migraine headache 17   recurrence 1st time in 10 year in 2011     Allergies  Allergen Reactions   Pineapple Other (See Comments)    Mouth Swelling   Bacid Rash     Current Outpatient Medications  Medication Sig Dispense Refill   albuterol (VENTOLIN HFA) 108 (90 Base) MCG/ACT inhaler INHALE 2 PUFFS INTO THE LUNGS EVERY 6 HOURS AS NEEDED FOR WHEEZING OR SHORTNESS OF BREATH 18 g 1   amLODipine (NORVASC) 10 MG tablet Take 1 tablet (10 mg total) by mouth daily. 90 tablet 3   budesonide-formoterol (SYMBICORT) 160-4.5 MCG/ACT inhaler Inhale 2 puffs twice a day with spacer to help prevent cough and wheeze 1 each 5   Calcium Carbonate-Vitamin D (CALCIUM 600 + D PO) Take 1 tablet by mouth 2 (two) times daily.     cetirizine (ZYRTEC) 10 MG tablet Take 1 tablet (10 mg total) by mouth daily as needed for allergies. 30 tablet 5    EPINEPHrine 0.3 mg/0.3 mL IJ SOAJ injection Inject 0.3 mg into the muscle as needed for anaphylaxis. 1 each 1   FASENRA 30 MG/ML SOSY INJECT 1 SYRINGE UNDER THE SKIN EVERY 8 WEEKS. 1 mL 7   fluticasone (FLONASE) 50 MCG/ACT nasal spray SHAKE LIQUID AND USE 2 SPRAYS IN EACH NOSTRIL DAILY (Patient taking differently: Place 2 sprays into both nostrils daily as needed for rhinitis or allergies.) 16 g 3   gabapentin (NEURONTIN) 100 MG capsule TAKE 1 CAPSULE(100 MG) BY MOUTH THREE TIMES DAILY 90 capsule 0   ibuprofen (ADVIL) 800 MG tablet Take 1 tablet (800 mg total) by mouth every 8 (eight) hours as needed for headache. 20 tablet 1   montelukast (SINGULAIR) 10 MG tablet TAKE 1 TABLET(10 MG) BY MOUTH AT BEDTIME 90 tablet 1   Multiple Vitamin (MULTIVITAMIN WITH MINERALS) TABS Take 1 tablet by mouth at bedtime.      predniSONE (STERAPRED UNI-PAK 21 TAB) 10 MG (21) TBPK tablet 10 mg DS 12 as directed (Patient not taking: Reported on 10/10/2022) 48 tablet 0   rizatriptan (MAXALT) 5 MG tablet Take 1 tablet (5 mg total) by mouth as needed for migraine. May repeat in 2 hours if needed 10 tablet 0   spironolactone (ALDACTONE) 25 MG tablet TAKE 1  TABLET(25 MG) BY MOUTH DAILY (Patient taking differently: Take 25 mg by mouth at bedtime.) 30 tablet 0   spironolactone (ALDACTONE) 50 MG tablet TAKE 1 TABLET(50 MG) BY MOUTH DAILY 30 tablet 2   Tiotropium Bromide Monohydrate (SPIRIVA RESPIMAT) 1.25 MCG/ACT AERS Inhale 2 puffs once a day to help prevent cough and wheeze 4 g 5   Current Facility-Administered Medications  Medication Dose Route Frequency Provider Last Rate Last Admin   Benralizumab SOSY 30 mg  30 mg Subcutaneous Q28 days Valentina Shaggy, MD   30 mg at 10/11/22 0908     Past Surgical History:  Procedure Laterality Date   ABDOMINAL HYSTERECTOMY     CHOLECYSTECTOMY     COLONOSCOPY  11/13/2007   SLF:A 4 mm sessile transverse colon polyp/Random biopsies obtained throughout the colon/Otherwise normal.  Negative for microscopic colitis, simple adenoma, due for surveillance 11/2017 per SLF   COLONOSCOPY N/A 02/02/2016   Procedure: COLONOSCOPY;  Surgeon: Danie Binder, MD;  [facility];  Service: Endoscopy;  Laterality: N/A;  12:15 PM   COLONOSCOPY WITH PROPOFOL N/A 06/14/2021   Procedure: COLONOSCOPY WITH PROPOFOL;  Surgeon: Eloise Harman, DO;  Location: [facility];  Service: Endoscopy;  Laterality: N/A;  ASA II / 12:45   ESOPHAGOGASTRODUODENOSCOPY  03/20/2008   SLF:A 1- to 2-cm hiatal hernia, mild erythema in the antrum/Normal esophagus without evidence of Barrett's, +H.pylori s/p Prevapc treatment    ESOPHAGOGASTRODUODENOSCOPY (EGD) WITH ESOPHAGEAL DILATION N/A 02/14/2013   OBS:JGGEZM Esophageal web/MILD Non-erosive gastritis (inflammation)   PARTIAL HYSTERECTOMY  2002   SEPTOPLASTY N/A 08/16/2015   Procedure: SEPTOPLASTY;  Surgeon: Leta Baptist, MD;  Location: [facility];  Service: ENT;  Laterality: N/A;   SINUS ENDO WITH FUSION Bilateral 08/16/2015   Procedure: ETHMOIDECTOMY, SPHENOIDECTOMY, MAXILLARY ANTROSTOMY, FRONTAL RECESS EXPLORATION WITH FUSION NAVIGATION;  Surgeon: Leta Baptist, MD;  Location: [facility];  Service: ENT;  Laterality: Bilateral;   TUBAL LIGATION  1995     Allergies  Allergen Reactions   Pineapple Other (See Comments)    Mouth Swelling   Bacid Rash      Family History  Problem Relation Age of Onset   Hypertension Mother    Heart disease Mother    Diabetes Mother    Alcohol abuse Father    Hypertension Father    Stroke Father    Hypertension Sister    Kidney disease Sister    Diabetes Sister    Hypertension Sister    Diabetes Sister    Diabetes Brother    Heart disease Brother    Hypertension Brother    Colon cancer Neg Hx      Social History Ms. Goodwine reports that she has never smoked. She has never been exposed to tobacco smoke. She has never used smokeless tobacco. Ms. Mollenhauer reports no history of alcohol  use.   Review of Systems CONSTITUTIONAL: No weight loss, fever, chills, weakness or fatigue.  HEENT: Eyes: No visual loss, blurred vision, double vision or yellow sclerae.No hearing loss, sneezing, congestion, runny nose or sore throat.  SKIN: No rash or itching.  CARDIOVASCULAR: per hpi RESPIRATORY: No shortness of breath, cough or sputum.  GASTROINTESTINAL: No anorexia, nausea, vomiting or diarrhea. No abdominal pain or blood.  GENITOURINARY: No burning on urination, no polyuria NEUROLOGICAL: No headache, dizziness, syncope, paralysis, ataxia, numbness or tingling in the extremities. No change in bowel or bladder control.  MUSCULOSKELETAL: No muscle, back pain, joint pain or stiffness.  LYMPHATICS: No  enlarged nodes. No history of splenectomy.  PSYCHIATRIC: No history of depression or anxiety.  ENDOCRINOLOGIC: No reports of sweating, cold or heat intolerance. No polyuria or polydipsia.  Marland Kitchen   Physical Examination Today's Vitals   11/15/22 1320  BP: 130/80  Pulse: 71  SpO2: 98%  Weight: 152 lb 12.8 oz (69.3 kg)  Height: '5\' 5"'$  (1.651 m)   Body mass index is 25.43 kg/m.  Gen: resting comfortably, no acute distress HEENT: no scleral icterus, pupils equal round and reactive, no palptable cervical adenopathy,  CV: RRR, 2/6 systolic murmur rusb, no jvd Resp: Clear to auscultation bilaterally GI: abdomen is soft, non-tender, non-distended, normal bowel sounds, no hepatosplenomegaly MSK: extremities are warm, no edema.  Skin: warm, no rash Neuro:  no focal deficits Psych: appropriate affect       Assessment and Plan  1.Chest pain - somewhat atypical symptoms but risk factors including family history of CAD young age and her EKG is abnormal - would plan for exercise nuclear stress after her echo is complete for heart murmur  2. Heart murmur - will obtain echo    F/u pending testing results. After echo would anticipate exercise nuclear stress test.    Arnoldo Lenis, M.D.

## 2022-11-18 ENCOUNTER — Other Ambulatory Visit: Payer: Self-pay | Admitting: Orthopedic Surgery

## 2022-11-21 ENCOUNTER — Ambulatory Visit (HOSPITAL_COMMUNITY)
Admission: RE | Admit: 2022-11-21 | Discharge: 2022-11-21 | Disposition: A | Discharge status: Home / Self Care | Payer: BC Managed Care – PPO | Source: Ambulatory Visit | Attending: Cardiology | Admitting: Cardiology

## 2022-11-21 DIAGNOSIS — R011 Cardiac murmur, unspecified: Secondary | ICD-10-CM | POA: Diagnosis not present

## 2022-11-21 LAB — ECHOCARDIOGRAM COMPLETE
AR max vel: 1.7 cm2
AV Area VTI: 1.72 cm2
AV Area mean vel: 1.97 cm2
AV Mean grad: 8 mmHg
AV Peak grad: 17.5 mmHg
Ao pk vel: 2.09 m/s
Area-P 1/2: 3.48 cm2
S' Lateral: 2 cm

## 2022-11-21 NOTE — Progress Notes (Signed)
*  PRELIMINARY RESULTS* Echocardiogram 2D Echocardiogram has been performed.  Hannah Hays 11/21/2022, 2:39 PM

## 2022-12-08 ENCOUNTER — Telehealth: Payer: Self-pay

## 2022-12-08 DIAGNOSIS — R0789 Other chest pain: Secondary | ICD-10-CM

## 2022-12-08 NOTE — Telephone Encounter (Signed)
-----   Message from Arnoldo Lenis, MD sent at 12/08/2022  9:53 AM EST ----- Echo shows normal heart pumping function. Her aortic valve is just slightly stiffer than normal but overall is working well, this creates her heart mumur, this is just something that can develop with aging and just something to monitor at this time. Can we get an exercise nuclear stress test for chest pain please  Zandra Abts MD

## 2022-12-08 NOTE — Telephone Encounter (Signed)
Echo results discussed with patient  She agrees to have [place] for CP  Instructions discussed and also sent via MyChart

## 2022-12-11 ENCOUNTER — Ambulatory Visit: Payer: BC Managed Care – PPO

## 2022-12-13 ENCOUNTER — Ambulatory Visit: Payer: BC Managed Care – PPO

## 2022-12-13 ENCOUNTER — Other Ambulatory Visit: Payer: Self-pay | Admitting: *Deleted

## 2022-12-13 MED ORDER — FASENRA 30 MG/ML ~~LOC~~ SOSY: PREFILLED_SYRINGE | SUBCUTANEOUS | 7 refills | Status: DC

## 2022-12-14 ENCOUNTER — Encounter (HOSPITAL_COMMUNITY)
Admission: RE | Admit: 2022-12-14 | Discharge: 2022-12-14 | Disposition: A | Discharge status: Home / Self Care | Payer: BC Managed Care – PPO | Source: Ambulatory Visit | Attending: Cardiology | Admitting: Cardiology

## 2022-12-14 ENCOUNTER — Ambulatory Visit (HOSPITAL_COMMUNITY)
Admission: RE | Admit: 2022-12-14 | Discharge: 2022-12-14 | Disposition: A | Discharge status: Home / Self Care | Payer: BC Managed Care – PPO | Source: Ambulatory Visit | Attending: Cardiology | Admitting: Cardiology

## 2022-12-14 ENCOUNTER — Encounter (HOSPITAL_COMMUNITY): Payer: Self-pay

## 2022-12-14 DIAGNOSIS — R0789 Other chest pain: Secondary | ICD-10-CM | POA: Insufficient documentation

## 2022-12-14 LAB — NM MYOCAR MULTI W/SPECT W/WALL MOTION / EF
Angina Index: 0
RPE: 11
Rest Nuclear Isotope Dose: 10 mCi
ST Depression (mm): 0 mm
Stress Nuclear Isotope Dose: 28 mCi

## 2022-12-14 MED ORDER — REGADENOSON 0.4 MG/5ML IV SOLN
INTRAVENOUS | Status: AC
  Filled 2022-12-14: qty 5

## 2022-12-14 MED ORDER — TECHNETIUM TC 99M TETROFOSMIN IV KIT
10.0000 | PACK | Freq: Once | INTRAVENOUS | Status: AC | PRN
  Administered 2022-12-14: 11 via INTRAVENOUS

## 2022-12-14 MED ORDER — SODIUM CHLORIDE FLUSH 0.9 % IV SOLN
INTRAVENOUS | Status: AC
  Filled 2022-12-14: qty 10

## 2022-12-14 MED ORDER — TECHNETIUM TC 99M TETROFOSMIN IV KIT
30.0000 | PACK | Freq: Once | INTRAVENOUS | Status: AC | PRN
  Administered 2022-12-14: 28 via INTRAVENOUS

## 2022-12-19 DIAGNOSIS — J4550 Severe persistent asthma, uncomplicated: Secondary | ICD-10-CM | POA: Diagnosis not present

## 2023-01-09 ENCOUNTER — Other Ambulatory Visit: Payer: Self-pay | Admitting: Family

## 2023-01-22 ENCOUNTER — Other Ambulatory Visit: Payer: Self-pay | Admitting: Family Medicine

## 2023-01-26 ENCOUNTER — Ambulatory Visit (INDEPENDENT_AMBULATORY_CARE_PROVIDER_SITE_OTHER): Payer: BC Managed Care – PPO | Admitting: Family Medicine

## 2023-01-26 ENCOUNTER — Other Ambulatory Visit (HOSPITAL_COMMUNITY): Payer: Self-pay | Admitting: Family Medicine

## 2023-01-26 ENCOUNTER — Encounter: Payer: Self-pay | Admitting: Family Medicine

## 2023-01-26 VITALS — BP 122/72 | HR 68 | Ht 65.0 in | Wt 152.0 lb

## 2023-01-26 DIAGNOSIS — E559 Vitamin D deficiency, unspecified: Secondary | ICD-10-CM | POA: Diagnosis not present

## 2023-01-26 DIAGNOSIS — R7302 Impaired glucose tolerance (oral): Secondary | ICD-10-CM

## 2023-01-26 DIAGNOSIS — Z1231 Encounter for screening mammogram for malignant neoplasm of breast: Secondary | ICD-10-CM

## 2023-01-26 DIAGNOSIS — G43919 Migraine, unspecified, intractable, without status migrainosus: Secondary | ICD-10-CM | POA: Diagnosis not present

## 2023-01-26 DIAGNOSIS — J3089 Other allergic rhinitis: Secondary | ICD-10-CM

## 2023-01-26 DIAGNOSIS — I10 Essential (primary) hypertension: Secondary | ICD-10-CM | POA: Diagnosis not present

## 2023-01-26 DIAGNOSIS — E663 Overweight: Secondary | ICD-10-CM

## 2023-01-26 DIAGNOSIS — E785 Hyperlipidemia, unspecified: Secondary | ICD-10-CM | POA: Diagnosis not present

## 2023-01-26 MED ORDER — SPIRONOLACTONE 50 MG PO TABS: 50.0000 mg | ORAL_TABLET | Freq: Every day | ORAL | 3 refills | Status: DC

## 2023-01-26 NOTE — Assessment & Plan Note (Signed)
Managed by Allergist and controlled

## 2023-01-26 NOTE — Assessment & Plan Note (Signed)
Patient educated about the importance of limiting  Carbohydrate intake , the need to commit to daily physical activity for a minimum of 30 minutes , and to commit weight loss. The fact that changes in all these areas will reduce or eliminate all together the development of diabetes is stressed.      Latest Ref Rng & Units 07/26/2022    8:42 AM 01/26/2022    8:44 AM 07/21/2021    8:44 AM 06/10/2021    8:42 AM 12/08/2020    8:18 AM  Diabetic Labs  HbA1c 4.8 - 5.6 % 6.1  6.3  6.2     Chol 100 - 199 mg/dL  155  133   156   HDL >39 mg/dL  48  52   53   Calc LDL 0 - 99 mg/dL  87  70   90   Triglycerides 0 - 149 mg/dL  111  49   67   Creatinine 0.57 - 1.00 mg/dL 0.80  0.87  0.97  1.07  0.86       01/26/2023    9:45 AM 11/15/2022    1:20 PM 10/25/2022    3:08 PM 10/10/2022    8:34 AM 09/12/2022    8:39 AM 09/07/2022    8:44 AM 07/26/2022    8:20 AM  BP/Weight  Systolic BP 123XX123 [id number]  [id number]  Diastolic BP 72 80 70  78 76 80  Wt. (Lbs) 152 152.8 154.5 149 149 151   BMI 25.29 kg/m2 25.43 kg/m2 25.71 kg/m2 24.79 kg/m2 24.79 kg/m2 25.13 kg/m2        No data to display          Updated lab needed at/ before next visit.

## 2023-01-26 NOTE — Assessment & Plan Note (Signed)
  Patient re-educated about  the importance of commitment to a  minimum of 150 minutes of exercise per week as able.  The importance of healthy food choices with portion control discussed, as well as eating regularly and within a 12 hour window most days. The need to choose "clean , green" food 50 to 75% of the time is discussed, as well as to make water the primary drink and set a goal of 64 ounces water daily.       01/26/2023    9:45 AM 11/15/2022    1:20 PM 10/25/2022    3:08 PM  Weight /BMI  Weight 152 lb 152 lb 12.8 oz 154 lb 8 oz  Height 5' 5"$  (1.651 m) 5' 5"$  (1.651 m)   BMI 25.29 kg/m2 25.43 kg/m2 25.71 kg/m2

## 2023-01-26 NOTE — Patient Instructions (Signed)
F/U in 6 months, call if you need me sooner  Please get labs ordered in October today   Please schedule mammogram due in August at [facility] at checkout  It is important that you exercise regularly at least 30 minutes 5 times a week. If you develop chest pain, have severe difficulty breathing, or feel very tired, stop exercising immediately and seek medical attention     Need covid vaccine  Thanks for choosing [facility], we consider it a privelige to serve you.

## 2023-01-26 NOTE — Assessment & Plan Note (Signed)
Controlled, no change in medication DASH diet and commitment to daily physical activity for a minimum of 30 minutes discussed and encouraged, as a part of hypertension management. The importance of attaining a healthy weight is also discussed.     01/26/2023    9:45 AM 11/15/2022    1:20 PM 10/25/2022    3:08 PM 10/10/2022    8:34 AM 09/12/2022    8:39 AM 09/07/2022    8:44 AM 07/26/2022    8:20 AM  BP/Weight  Systolic BP 123XX123 [id number]  [id number]  Diastolic BP 72 80 70  78 76 80  Wt. (Lbs) 152 152.8 154.5 149 149 151   BMI 25.29 kg/m2 25.43 kg/m2 25.71 kg/m2 24.79 kg/m2 24.79 kg/m2 25.13 kg/m2

## 2023-01-26 NOTE — Progress Notes (Signed)
Hannah Hays     MRN: [id number]      DOB: [birth year]   HPI Hannah Hays is here for follow up and re-evaluation of chronic medical conditions, medication management and review of any available recent lab and radiology data.  Preventive health is updated, specifically  Cancer screening and Immunization.   Questions or concerns regarding consultations or procedures which the PT has had in the interim are  addressed. The PT denies any adverse reactions to current medications since the last visit.  There are no new concerns.  There are no specific complaints   ROS Denies recent fever or chills. Denies sinus pressure, nasal congestion, ear pain or sore throat. Denies chest congestion, productive cough or wheezing. Denies chest pains, palpitations and leg swelling Denies abdominal pain, nausea, vomiting,diarrhea or constipation.   Denies dysuria, frequency, hesitancy or incontinence. Denies joint pain, swelling and limitation in mobility. Denies headaches, seizures, numbness, or tingling. Denies depression, anxiety or insomnia. Denies skin break down or rash.   PE  BP 122/72 (BP Location: Right Arm, Patient Position: Sitting, Cuff Size: Normal)   Pulse 68   Ht '5\' 5"'$  (1.651 m)   Wt 152 lb (68.9 kg)   SpO2 92%   BMI 25.29 kg/m   Patient alert and oriented and in no cardiopulmonary distress.  HEENT: No facial asymmetry, EOMI,     Neck supple .  Chest: Clear to auscultation bilaterally.  CVS: S1, S2 no murmurs, no S3.Regular rate.  ABD: Soft non tender.   Ext: No edema  MS: Adequate ROM spine, shoulders, hips and knees.  Skin: Intact, no ulcerations or rash noted.  Psych: Good eye contact, normal affect. Memory intact not anxious or depressed appearing.  CNS: CN 2-12 intact, power,  normal throughout.no focal deficits noted.   Assessment & Plan  HTN (hypertension) Controlled, no change in medication DASH diet and commitment to daily physical activity for a minimum  of 30 minutes discussed and encouraged, as a part of hypertension management. The importance of attaining a healthy weight is also discussed.     01/26/2023    9:45 AM 11/15/2022    1:20 PM 10/25/2022    3:08 PM 10/10/2022    8:34 AM 09/12/2022    8:39 AM 09/07/2022    8:44 AM 07/26/2022    8:20 AM  BP/Weight  Systolic BP [id number]  [id number]  Diastolic BP 72 80 70  78 76 80  Wt. (Lbs) 152 152.8 154.5 149 149 151   BMI 25.29 kg/m2 25.43 kg/m2 25.71 kg/m2 24.79 kg/m2 24.79 kg/m2 25.13 kg/m2        Migraine Controlled, no change in medication   Perennial allergic rhinitis Managed by Allergist and controlled  IGT (impaired glucose tolerance) Patient educated about the importance of limiting  Carbohydrate intake , the need to commit to daily physical activity for a minimum of 30 minutes , and to commit weight loss. The fact that changes in all these areas will reduce or eliminate all together the development of diabetes is stressed.      Latest Ref Rng & Units 07/26/2022    8:42 AM 01/26/2022    8:44 AM 07/21/2021    8:44 AM 06/10/2021    8:42 AM 12/08/2020    8:18 AM  Diabetic Labs  HbA1c 4.8 - 5.6 % 6.1  6.3  6.2     Chol 100 - 199 mg/dL  155  133   156   HDL >39  mg/dL  48  52   53   Calc LDL 0 - 99 mg/dL  87  70   90   Triglycerides 0 - 149 mg/dL  111  49   67   Creatinine 0.57 - 1.00 mg/dL 0.80  0.87  0.97  1.07  0.86       01/26/2023    9:45 AM 11/15/2022    1:20 PM 10/25/2022    3:08 PM 10/10/2022    8:34 AM 09/12/2022    8:39 AM 09/07/2022    8:44 AM 07/26/2022    8:20 AM  BP/Weight  Systolic BP [id number]  [id number]  Diastolic BP 72 80 70  78 76 80  Wt. (Lbs) 152 152.8 154.5 149 149 151   BMI 25.29 kg/m2 25.43 kg/m2 25.71 kg/m2 24.79 kg/m2 24.79 kg/m2 25.13 kg/m2        No data to display          Updated lab needed at/ before next visit.   Overweight (BMI 25.0-29.9)  Patient re-educated about  the importance of commitment to a  minimum of 150  minutes of exercise per week as able.  The importance of healthy food choices with portion control discussed, as well as eating regularly and within a 12 hour window most days. The need to choose "clean , green" food 50 to 75% of the time is discussed, as well as to make water the primary drink and set a goal of 64 ounces water daily.       01/26/2023    9:45 AM 11/15/2022    1:20 PM 10/25/2022    3:08 PM  Weight /BMI  Weight 152 lb 152 lb 12.8 oz 154 lb 8 oz  Height '5\' 5"'$  (1.651 m) '5\' 5"'$  (1.651 m)   BMI 25.29 kg/m2 25.43 kg/m2 25.71 kg/m2

## 2023-01-26 NOTE — Assessment & Plan Note (Signed)
Controlled, no change in medication  

## 2023-01-27 ENCOUNTER — Other Ambulatory Visit: Payer: Self-pay | Admitting: Family Medicine

## 2023-01-27 LAB — LIPID PANEL
Chol/HDL Ratio: 4.6 ratio — ABNORMAL HIGH (ref 0.0–4.4)
Cholesterol, Total: 235 mg/dL — ABNORMAL HIGH (ref 100–199)
HDL: 51 mg/dL (ref >39)
LDL Chol Calc (NIH): 165 mg/dL — ABNORMAL HIGH (ref 0–99)
Triglycerides: 108 mg/dL (ref 0–149)
VLDL Cholesterol Cal: 19 mg/dL (ref 5–40)

## 2023-01-27 LAB — CMP14+EGFR
ALT: 15 IU/L (ref 0–32)
AST: 14 IU/L (ref 0–40)
Albumin/Globulin Ratio: 2 (ref 1.2–2.2)
Albumin: 4.8 g/dL (ref 3.8–4.9)
Alkaline Phosphatase: 78 IU/L (ref 44–121)
BUN/Creatinine Ratio: 16 (ref 9–23)
BUN: 14 mg/dL (ref 6–24)
Bilirubin Total: 0.4 mg/dL (ref 0.0–1.2)
CO2: 24 mmol/L (ref 20–29)
Calcium: 9.6 mg/dL (ref 8.7–10.2)
Chloride: 104 mmol/L (ref 96–106)
Creatinine, Ser: 0.9 mg/dL (ref 0.57–1.00)
Globulin, Total: 2.4 g/dL (ref 1.5–4.5)
Glucose: 102 mg/dL — ABNORMAL HIGH (ref 70–99)
Potassium: 3.9 mmol/L (ref 3.5–5.2)
Sodium: 144 mmol/L (ref 134–144)
Total Protein: 7.2 g/dL (ref 6.0–8.5)
eGFR: 75 mL/min/{1.73_m2} (ref >59)

## 2023-01-27 LAB — HEMOGLOBIN A1C
Est. average glucose Bld gHb Est-mCnc: 128 mg/dL
Hgb A1c MFr Bld: 6.1 % — ABNORMAL HIGH (ref 4.8–5.6)

## 2023-01-27 LAB — VITAMIN D 25 HYDROXY (VIT D DEFICIENCY, FRACTURES): Vit D, 25-Hydroxy: 30.3 ng/mL (ref 30.0–100.0)

## 2023-01-27 MED ORDER — ROSUVASTATIN CALCIUM 20 MG PO TABS: 20.0000 mg | ORAL_TABLET | Freq: Every day | ORAL | 1 refills | Status: DC

## 2023-01-29 ENCOUNTER — Other Ambulatory Visit: Payer: Self-pay | Admitting: Family Medicine

## 2023-01-29 DIAGNOSIS — R7302 Impaired glucose tolerance (oral): Secondary | ICD-10-CM

## 2023-01-29 DIAGNOSIS — I10 Essential (primary) hypertension: Secondary | ICD-10-CM

## 2023-01-29 DIAGNOSIS — E785 Hyperlipidemia, unspecified: Secondary | ICD-10-CM

## 2023-01-29 DIAGNOSIS — E559 Vitamin D deficiency, unspecified: Secondary | ICD-10-CM

## 2023-01-29 NOTE — Progress Notes (Signed)
Labs ordered and mailed to pt.

## 2023-02-01 ENCOUNTER — Encounter: Payer: Self-pay | Admitting: Radiology

## 2023-03-28 ENCOUNTER — Ambulatory Visit (INDEPENDENT_AMBULATORY_CARE_PROVIDER_SITE_OTHER): Payer: BC Managed Care – PPO | Admitting: Allergy & Immunology

## 2023-03-28 ENCOUNTER — Encounter: Payer: Self-pay | Admitting: Allergy & Immunology

## 2023-03-28 ENCOUNTER — Other Ambulatory Visit: Payer: Self-pay

## 2023-03-28 VITALS — BP 118/68 | HR 68 | Temp 97.2°F | Resp 16 | Ht 65.5 in | Wt 151.8 lb

## 2023-03-28 DIAGNOSIS — J4551 Severe persistent asthma with (acute) exacerbation: Secondary | ICD-10-CM

## 2023-03-28 DIAGNOSIS — J4550 Severe persistent asthma, uncomplicated: Secondary | ICD-10-CM

## 2023-03-28 DIAGNOSIS — J3089 Other allergic rhinitis: Secondary | ICD-10-CM

## 2023-03-28 MED ORDER — FLUTICASONE PROPIONATE 50 MCG/ACT NA SUSP: 1.0000 | Freq: Every day | NASAL | 1 refills | Status: DC

## 2023-03-28 MED ORDER — SPIRIVA RESPIMAT 1.25 MCG/ACT IN AERS: 2.0000 | INHALATION_SPRAY | Freq: Every day | RESPIRATORY_TRACT | 1 refills | Status: DC

## 2023-03-28 MED ORDER — BUDESONIDE-FORMOTEROL FUMARATE 160-4.5 MCG/ACT IN AERO: 2.0000 | INHALATION_SPRAY | Freq: Two times a day (BID) | RESPIRATORY_TRACT | 1 refills | Status: DC

## 2023-03-28 MED ORDER — MONTELUKAST SODIUM 10 MG PO TABS: 10.0000 mg | ORAL_TABLET | Freq: Every day | ORAL | 1 refills | Status: DC

## 2023-03-28 MED ORDER — CETIRIZINE HCL 10 MG PO TABS: 10.0000 mg | ORAL_TABLET | Freq: Every day | ORAL | 1 refills | Status: DC | PRN

## 2023-03-28 MED ORDER — PREDNISONE 10 MG PO TABS: ORAL_TABLET | ORAL | 0 refills | Status: DC

## 2023-03-28 NOTE — Patient Instructions (Addendum)
1. Moderate persistent asthma, uncomplicated - Lung testing is back in the mid 50% range now since the Harrington Challenger is not on board. - We are going to work with Tammy to see what is going on with this.  - We will submit for you to receive a home spirometry machine called [facility] (they will call you with the anticipated cost and set up process).  - Daily controller medication(s): Symbicort 160/4.43mcg 2 puffs twice daily with spacer + Spiriva 1.14mcg 2 puffs once daily + Singulair  daily + Fasenra every 8 weeks - Rescue medications: albuterol 2 puffs every 4-6 hours as needed - Asthma control goals:  * Full participation in all desired activities (may need albuterol before activity) * Albuterol use two time or less a week on average (not counting use with activity) * Cough interfering with sleep two time or less a month * Oral steroids no more than once a year * No hospitalizations  2. perennial allergic rhinitis  - Continue with fluticasone two puffs once daily as needed for stuffy nose.   - Continue with nasal saline rinses 1-2 times daily.  - Continue with Singulair  daily. - Continue Zyrtec 10 mg  and Xyzal  at night.   3. Return in about 3 months (around 06/27/2023). You can have the follow up appointment with Dr. Dellis Anes or a Nurse Practicioner (our Nurse Practitioners are excellent and always have Physician oversight!).    Please inform us of any Emergency Department visits, hospitalizations, or changes in symptoms. Call us before going to the ED for breathing or allergy symptoms since we might be able to fit you in for a sick visit. Feel free to contact us anytime with any questions, problems, or concerns.  It was a pleasure to see you again today!  Websites that have reliable patient information: 1. American Academy of Asthma, Allergy, and Immunology: www.aaaai.org 2. Food Allergy Research and Education (FARE): foodallergy.org 3. Mothers of Asthmatics:  http://www.asthmacommunitynetwork.org 4. American College of Allergy, Asthma, and Immunology: www.acaai.org   COVID-19 Vaccine Information can be found at: [email or web address] For questions related to vaccine distribution or appointments, please email [email or web address] or call [phone].   We realize that you might be concerned about having an allergic reaction to the COVID19 vaccines. To help with that concern, WE ARE OFFERING THE COVID19 VACCINES IN OUR OFFICE! Ask the front desk for dates!     "Like" [place] on Facebook and Instagram for our latest updates!      A healthy democracy works best when [employer] participate! Make sure you are registered to vote! If you have moved or changed any of your contact information, you will need to get this updated before voting!  In some cases, you MAY be able to register to vote online: [email or web address]

## 2023-03-28 NOTE — Addendum Note (Signed)
Addended by: Alfonse Spruce on: 03/28/2023 01:04 PM   Modules accepted: Orders

## 2023-03-28 NOTE — Progress Notes (Addendum)
FOLLOW UP  Date of Service/Encounter:  03/28/23   Assessment:   Moderate persistent asthma - with acute exacerbation (NOT on Fasenra any longer)  Submitting for Hannah Hays home spirometry device    Perennial allergic rhinitis   Adverse food reaction   Unfortunately, Hannah Hays has not received her Harrington Challenger around 5 months.  This is unfortunate because we had completely normalized her spirometry.  It is now back to where they were before we started the [place].  She did respond fairly well to the Xopenex today and we are starting her on a course of prednisone.  We also gave her a sample of Fasenra to get her back on track.  I think at this point she is going to need to come monthly for 3 doses and then we can space it out to 8 weeks.   Plan/Recommendations:   1. Moderate persistent asthma, uncomplicated - Lung testing is back in the mid 50% range now since the Harrington Challenger is not on board. - We are going to work with Hannah Hays to see what is going on with this.  - We will submit for you to receive a home spirometry machine called Hannah Hays (they will call you with the anticipated cost and set up process).  - Daily controller medication(s): Symbicort 160/4.67mcg 2 puffs twice daily with spacer + Spiriva 1.87mcg 2 puffs once daily + Singulair  daily + Fasenra every 8 weeks - Rescue medications: albuterol 2 puffs every 4-6 hours as needed - Asthma control goals:  * Full participation in all desired activities (may need albuterol before activity) * Albuterol use two time or less a week on average (not counting use with activity) * Cough interfering with sleep two time or less a month * Oral steroids no more than once a year * No hospitalizations  2. perennial allergic rhinitis  - Continue with fluticasone two puffs once daily as needed for stuffy nose.   - Continue with nasal saline rinses 1-2 times daily.  - Continue with Singulair  daily. - Continue Zyrtec 10 mg  and Xyzal  at night.   3.  Return in about 3 months (around 06/27/2023). You can have the follow up appointment with Hannah Hays or a Nurse Practicioner (our Nurse Practitioners are excellent and always have Physician oversight!).     Subjective:   Hannah Hays is a 57 y.o. female presenting today for follow up of  Chief Complaint  Patient presents with   Asthma    4-6 mth f/u - Not to bad   Perennial Allergic rhinitis    4-6 mth f/u - Not bad until pollen season hit   Food Allergy    4-6 mth f/u - Patient stated she ate some pineapple this weekend - reacted but not to bad    Hannah Hays has a history of the following: Patient Active Problem List   Diagnosis Date Noted   Chronic pain of left elbow 09/07/2022   Posterior right knee pain 12/16/2020   Overweight (BMI 25.0-29.9) 06/30/2020   Insomnia 08/07/2018   Perennial allergic rhinitis 08/28/2017   Moderate persistent asthma, uncomplicated 08/28/2017   Hx of adenomatous colonic polyps    Lipoma 12/26/2015   Liver mass, left lobe 02/11/2015   Nasal polyposis 08/20/2014   Chronic nonallergic rhinitis 08/20/2014   Vitamin D deficiency 07/12/2012   HTN (hypertension) 08/19/2011   GERD (gastroesophageal reflux disease) 04/11/2011   IBS 12/19/2010   Migraine 01/19/2010   IGT (impaired glucose tolerance) 12/26/2009  Asthma 11/15/2007    History obtained from: chart review and patient.  Hannah Hays is a 57 y.o. female presenting for a follow up visit.  She was last seen in November 2023 by one of our nurse practitioners.  At that time, she was continued on Symbicort 160 mcg 2 puffs twice daily as well as Spiriva, Singulair, and Fasenra every 8 weeks.  For her rhinitis, we continue with Flonase as well as Singulair and Zyrtec.  Her spirometry has gradually been increasing over time.  It was normal at the last visit.  Since last visit, she has done well.  Asthma/Respiratory Symptom History: She was doing well before the testing happened. She remains on  the Symbicort and the Spiriva. This is mostly working to control her breathing. She has not been using her rescue inhaler much at all. She has not been on prednisone in a while for her breathing.  She last got her Harrington Challenger in November 2023. She can tell a difference now that she is no longer on it. There are no notes about this in her chart. This is the typically worse time of the year for her breathing. Symbicort was covered last year 100%. It seems to be this year as well. She tells me that she was told that her insurance would no longer cover her Harrington Challenger, but she does not have any other details.   Allergic Rhinitis Symptom History: She remains on the cetirizine as well as the montelukast. She has Flonase and uses that occasionally. She is also on levocetirizine once daily as well.  She is doing well with this regimen. Her allergic rhinitis symptoms are under great control with this regimen.   She continues to follow with Dr. Lodema Hays as her PCP. She was having some tingling in her hand and talked to Dr. Lodema Hays about this. She thought that this was arthritis. It would start in her elbow and travel up and down her arm and then across the chest. She had a large workup which was normal.   She continues to work at the [employer] place. 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 Daughter is still a mess.   Otherwise, there have been no changes to her past medical history, surgical history, family history, or social history.    Review of Systems  Constitutional: Negative.  Negative for chills, fever, malaise/fatigue and weight loss.  HENT: Negative.  Negative for congestion, ear discharge and ear pain.        Reports rhinorrhea, nasal congestion, and postnasal drip at times.    Eyes:  Negative for pain, discharge and redness.       Denies itchy watery eyes  Respiratory:  Positive for cough, shortness of breath and wheezing. Negative for hemoptysis  and sputum production.        Reports not much coughing and wheezing.  She did have 1 episode of shortness of breath with the weather changing.  She denies tightness in her chest and nocturnal awakenings due to breathing problems  Cardiovascular: Negative.  Negative for chest pain and palpitations.  Gastrointestinal:  Negative for abdominal pain, heartburn, nausea and vomiting.       Reports reflux depending on her diet.  She does take Nexium as needed  Genitourinary:  Negative for frequency.  Skin: Negative.  Negative for itching and rash.  Neurological:  Negative for dizziness and headaches.  Endo/Heme/Allergies:  Positive for environmental allergies. Does not bruise/bleed easily.  Objective:   Blood pressure 118/68, pulse 68, temperature (!) 97.2 F (36.2 C), temperature source Temporal, resp. rate 16, height 5' 5.5" (1.664 m), weight 151 lb 12.8 oz (68.9 kg), SpO2 98 %. Body mass index is 24.88 kg/m.    Physical Exam Vitals reviewed.  Constitutional:      Appearance: She is well-developed.  HENT:     Head: Normocephalic and atraumatic.     Right Ear: Tympanic membrane, ear canal and external ear normal.     Left Ear: Tympanic membrane, ear canal and external ear normal.     Nose: No nasal deformity, septal deviation, mucosal edema or rhinorrhea.     Right Turbinates: Enlarged, swollen and pale.     Left Turbinates: Enlarged, swollen and pale.     Right Sinus: No maxillary sinus tenderness or frontal sinus tenderness.     Left Sinus: No maxillary sinus tenderness or frontal sinus tenderness.     Mouth/Throat:     Lips: Pink.     Mouth: Mucous membranes are moist. Mucous membranes are not pale and not dry.     Pharynx: Uvula midline.     Comments: Cobblestoning mild. Eyes:     General: Lids are normal. No allergic shiner.       Right eye: No discharge.        Left eye: No discharge.     Conjunctiva/sclera: Conjunctivae normal.     Right eye: Right conjunctiva is  not injected. No chemosis.    Left eye: Left conjunctiva is not injected. No chemosis.    Pupils: Pupils are equal, round, and reactive to light.  Cardiovascular:     Rate and Rhythm: Normal rate and regular rhythm.     Heart sounds: Normal heart sounds.  Pulmonary:     Effort: Pulmonary effort is normal. No tachypnea, accessory muscle usage or respiratory distress.     Breath sounds: Normal breath sounds. No wheezing, rhonchi or rales.     Comments: Decreased air movement at the bases. She does have some more wheezing following the Xopenex administration. She had some trouble making full sentences initially, but this improved following her Xopenex.  Chest:     Chest wall: No tenderness.  Lymphadenopathy:     Cervical: No cervical adenopathy.  Skin:    General: Skin is warm.     Capillary Refill: Capillary refill takes less than 2 seconds.     Coloration: Skin is not pale.     Findings: No abrasion, erythema, petechiae or rash. Rash is not papular, urticarial or vesicular.  Neurological:     Mental Status: She is alert.  Psychiatric:        Behavior: Behavior is cooperative.      Diagnostic studies:    Spirometry: results abnormal (FEV1: 1.28/56%, FVC: 2.10/73%, FEV1/FVC: 61%).    Spirometry consistent with moderate obstructive disease. Xopenex four puffs via MDI treatment given in clinic with significant improvement in FEV1 and FVC per ATS criteria.  Allergy Studies: none      Malachi Bonds, MD  [facility]

## 2023-07-04 ENCOUNTER — Encounter: Payer: Self-pay | Admitting: Allergy & Immunology

## 2023-07-04 ENCOUNTER — Other Ambulatory Visit: Payer: Self-pay

## 2023-07-04 ENCOUNTER — Ambulatory Visit (INDEPENDENT_AMBULATORY_CARE_PROVIDER_SITE_OTHER): Payer: BC Managed Care – PPO | Admitting: Allergy & Immunology

## 2023-07-04 VITALS — BP 118/70 | HR 63 | Temp 98.3°F | Resp 20 | Ht 65.16 in | Wt 154.6 lb

## 2023-07-04 DIAGNOSIS — J3089 Other allergic rhinitis: Secondary | ICD-10-CM | POA: Diagnosis not present

## 2023-07-04 DIAGNOSIS — J4550 Severe persistent asthma, uncomplicated: Secondary | ICD-10-CM | POA: Diagnosis not present

## 2023-07-04 MED ORDER — BREZTRI AEROSPHERE 160-9-4.8 MCG/ACT IN AERO: 2.0000 | INHALATION_SPRAY | Freq: Two times a day (BID) | RESPIRATORY_TRACT | 5 refills | Status: DC

## 2023-07-04 NOTE — Patient Instructions (Addendum)
1. Moderate persistent asthma, uncomplicated - Lung testing is a bit better compared to last time, but it was still better with the [place] on board. - I am talking to Tammy to see what is going on with this.  - Stop the Symbicort and Spiriva and start Breztri two puffs twice daily (copay card provided).  - Samples provided today.  - Daily controller medication(s): Breztri two puffs twice daily with spacer + Singulair 10mg  daily - Rescue medications: albuterol 2 puffs every 4-6 hours as needed - Asthma control goals:  * Full participation in all desired activities (may need albuterol before activity) * Albuterol use two time or less a week on average (not counting use with activity) * Cough interfering with sleep two time or less a month * Oral steroids no more than once a year * No hospitalizations  2. Perennial allergic rhinitis  - Continue with Flonase Sensimist two puffs once daily as needed for stuffy nose.   - Continue with nasal saline rinses 1-2 times daily.  - Continue with Singulair 10mg  daily. - Continue Zyrtec 10 mg and Xyzal 5mg  at night.   3. Return in about 3 months (around 10/04/2023).    Please inform us of any Emergency Department visits, hospitalizations, or changes in symptoms. Call us before going to the ED for breathing or allergy symptoms since we might be able to fit you in for a sick visit. Feel free to contact us anytime with any questions, problems, or concerns.  It was a pleasure to see you again today!  Websites that have reliable patient information: 1. American Academy of Asthma, Allergy, and Immunology: www.aaaai.org 2. Food Allergy Research and Education (FARE): foodallergy.org 3. Mothers of Asthmatics: http://www.asthmacommunitynetwork.org 4. American College of Allergy, Asthma, and Immunology: www.acaai.org   COVID-19 Vaccine Information can be found at: [email or web address].nl For questions  related to vaccine distribution or appointments, please email [email or web address] or call [phone].   We realize that you might be concerned about having an allergic reaction to the COVID19 vaccines. To help with that concern, WE ARE OFFERING THE COVID19 VACCINES IN OUR OFFICE! Ask the front desk for dates!     "Like" [place] on Facebook and Instagram for our latest updates!      A healthy democracy works best when [employer] participate! Make sure you are registered to vote! If you have moved or changed any of your contact information, you will need to get this updated before voting!  In some cases, you MAY be able to register to vote online: [email or web address]

## 2023-07-04 NOTE — Progress Notes (Signed)
FOLLOW UP  Date of Service/Encounter:  07/04/23   Assessment:   Moderate persistent asthma - needs to restart the Fasenra (repeat CBC with differential pending)   Perennial allergic rhinitis   Adverse food reaction  Plan/Recommendations:   1. Moderate persistent asthma, uncomplicated - Lung testing is a bit better compared to last time, but it was still better with the [place] on board. - I am talking to Tammy to see what is going on with this.  - Stop the Symbicort and Spiriva and start Breztri two puffs twice daily (copay card provided).  - Samples provided today.  - Daily controller medication(s): Breztri two puffs twice daily with spacer + Singulair 10mg  daily - Rescue medications: albuterol 2 puffs every 4-6 hours as needed - Asthma control goals:  * Full participation in all desired activities (may need albuterol before activity) * Albuterol use two time or less a week on average (not counting use with activity) * Cough interfering with sleep two time or less a month * Oral steroids no more than once a year * No hospitalizations  2. Perennial allergic rhinitis  - Continue with Flonase Sensimist two puffs once daily as needed for stuffy nose.   - Continue with nasal saline rinses 1-2 times daily.  - Continue with Singulair 10mg  daily. - Continue Zyrtec 10 mg and Xyzal 5mg  at night.   3. Return in about 3 months (around 10/04/2023).   Subjective:   Hannah Hays is a 57 y.o. female presenting today for follow up of  Chief Complaint  Patient presents with   Follow-up    Hannah Hays has a history of the following: Patient Active Problem List   Diagnosis Date Noted   Chronic pain of left elbow 09/07/2022   Posterior right knee pain 12/16/2020   Overweight (BMI 25.0-29.9) 06/30/2020   Insomnia 08/07/2018   Perennial allergic rhinitis 08/28/2017   Moderate persistent asthma, uncomplicated 08/28/2017   Hx of adenomatous colonic polyps    Lipoma 12/26/2015    Liver mass, left lobe 02/11/2015   Nasal polyposis 08/20/2014   Chronic nonallergic rhinitis 08/20/2014   Vitamin D deficiency 07/12/2012   HTN (hypertension) 08/19/2011   GERD (gastroesophageal reflux disease) 04/11/2011   IBS 12/19/2010   Migraine 01/19/2010   IGT (impaired glucose tolerance) 12/26/2009   Asthma 11/15/2007    History obtained from: chart review and patient.  Hannah Hays is a 57 y.o. female presenting for a follow up visit.  She was last seen in April 2024.  At that time, her lung testing was in the mid 50% range.  She was also having quite a bit of wheezing of breath.  We continued her on Symbicort 2 puffs twice daily as well as Spiriva 2 puffs once daily and Singulair.  She was also interested in restarting her Harrington Challenger, so we will try to get that ball moving.  For her rhinitis, we continue with Flonase as well as nasal saline as needed, Singulair, Zyrtec, and Xyzal.  Since last visit, she has been about the same.  Asthma/Respiratory Symptom History: She remains on the Symbicort two puffs twice daily and Spiriva.  She definitely felt better with the [facility]. She has needed prednisone since the last visit once. She is not coughing at lot at night. She works at night so this is not really a problem. Her worse time of the day is noon or so.  She has never been been on Trelegy or Breztri.  She remains interested in  restarting the [place], but she never heard anything.  I did talk to [facility] and she is going to need a repeat CBC with differential to get Harrington Challenger back on board.  She does remain interested in this.  Her last prednisone was when we saw her in April 2024.  Allergic Rhinitis Symptom History: She remains on the  cetirizine in the morning and Xyzal at night.  She also is on the Singulair 10 mg daily.  She uses Flonase Sensimist since this was less irritating than the traditional Flonase.  She was on Qnasl in the past, but found that this was irritating as well.  She has not  been on antibiotics.  She has not had any sinus infections.  Otherwise, there have been no changes to her past medical history, surgical history, family history, or social history.    Review of systems otherwise negative other than that mentioned in the HPI.    Objective:   Blood pressure 118/70, pulse 63, temperature 98.3 F (36.8 C), resp. rate 20, height 5' 5.16" (1.655 m), weight 154 lb 9.6 oz (70.1 kg), SpO2 98%. Body mass index is 25.6 kg/m.    Physical Exam Vitals reviewed.  Constitutional:      Appearance: She is well-developed.     Comments: Pleasant.  HENT:     Head: Normocephalic and atraumatic.     Right Ear: Tympanic membrane, ear canal and external ear normal.     Left Ear: Tympanic membrane, ear canal and external ear normal.     Nose: No nasal deformity, septal deviation, mucosal edema or rhinorrhea.     Right Turbinates: Enlarged, swollen and pale.     Left Turbinates: Enlarged, swollen and pale.     Right Sinus: No maxillary sinus tenderness or frontal sinus tenderness.     Left Sinus: No maxillary sinus tenderness or frontal sinus tenderness.     Comments: No polyps.    Mouth/Throat:     Lips: Pink.     Mouth: Mucous membranes are moist. Mucous membranes are not pale and not dry.     Pharynx: Uvula midline.     Comments: Mild cobblestoning. Eyes:     General: Lids are normal. No allergic shiner.       Right eye: No discharge.        Left eye: No discharge.     Conjunctiva/sclera: Conjunctivae normal.     Right eye: Right conjunctiva is not injected. No chemosis.    Left eye: Left conjunctiva is not injected. No chemosis.    Pupils: Pupils are equal, round, and reactive to light.  Cardiovascular:     Rate and Rhythm: Normal rate and regular rhythm.     Heart sounds: Normal heart sounds.  Pulmonary:     Effort: Pulmonary effort is normal. No tachypnea, accessory muscle usage or respiratory distress.     Breath sounds: Normal breath sounds. No  wheezing, rhonchi or rales.     Comments: Decreased air movement at the bases.  No increased work of breathing. Chest:     Chest wall: No tenderness.  Lymphadenopathy:     Cervical: No cervical adenopathy.  Skin:    General: Skin is warm.     Capillary Refill: Capillary refill takes less than 2 seconds.     Coloration: Skin is not pale.     Findings: No abrasion, erythema, petechiae or rash. Rash is not papular, urticarial or vesicular.  Neurological:     Mental Status: She is alert.  Psychiatric:        Behavior: Behavior is cooperative.      Diagnostic studies:    Spirometry: results abnormal (FEV1: 1.42/62%, FVC: 2.24/78%, FEV1/FVC: 63%).    Spirometry consistent with moderate obstructive disease.  Overall, this is slightly lower than it was when she was on her Harrington Challenger.  Allergy Studies: none, although we did order a CBC with differential        Malachi Bonds, MD  [facility]

## 2023-07-06 DIAGNOSIS — J4550 Severe persistent asthma, uncomplicated: Secondary | ICD-10-CM | POA: Diagnosis not present

## 2023-07-16 ENCOUNTER — Telehealth: Payer: Self-pay | Admitting: *Deleted

## 2023-07-16 NOTE — Telephone Encounter (Signed)
Spoke to patient and advised she did not qualify for Harrington Challenger but approval done for [employer]. Rx to [facility] and will reach out once delivery set to make appt to start therapy in clinic per patient request

## 2023-07-16 NOTE — Telephone Encounter (Signed)
-----   Message from Alfonse Spruce sent at 07/04/2023 12:24 PM EDT ----- Harrington Challenger restart. CBC pending.

## 2023-07-16 NOTE — Telephone Encounter (Signed)
Good deal! Thank you, Tammy!

## 2023-07-23 ENCOUNTER — Encounter (HOSPITAL_COMMUNITY): Payer: Self-pay

## 2023-07-23 ENCOUNTER — Ambulatory Visit (HOSPITAL_COMMUNITY)
Admission: RE | Admit: 2023-07-23 | Discharge: 2023-07-23 | Disposition: A | Discharge status: Home / Self Care | Payer: BC Managed Care – PPO | Source: Ambulatory Visit | Attending: Family Medicine | Admitting: Family Medicine

## 2023-07-23 DIAGNOSIS — Z1231 Encounter for screening mammogram for malignant neoplasm of breast: Secondary | ICD-10-CM | POA: Diagnosis not present

## 2023-07-27 ENCOUNTER — Ambulatory Visit: Payer: BC Managed Care – PPO | Admitting: Family Medicine

## 2023-08-07 DIAGNOSIS — E785 Hyperlipidemia, unspecified: Secondary | ICD-10-CM | POA: Diagnosis not present

## 2023-08-07 DIAGNOSIS — R7302 Impaired glucose tolerance (oral): Secondary | ICD-10-CM | POA: Diagnosis not present

## 2023-08-07 DIAGNOSIS — I10 Essential (primary) hypertension: Secondary | ICD-10-CM | POA: Diagnosis not present

## 2023-08-08 ENCOUNTER — Ambulatory Visit: Payer: BC Managed Care – PPO | Admitting: Family Medicine

## 2023-08-19 MED ORDER — EZETIMIBE 10 MG PO TABS
10.0000 mg | ORAL_TABLET | Freq: Every day | ORAL | 3 refills | Status: DC
Start: 2023-08-19 — End: 2024-10-15

## 2023-08-30 ENCOUNTER — Telehealth: Payer: Self-pay | Admitting: *Deleted

## 2023-08-30 NOTE — Telephone Encounter (Signed)
L/m for patient to call to make appt to start tezspire

## 2023-09-03 ENCOUNTER — Ambulatory Visit (INDEPENDENT_AMBULATORY_CARE_PROVIDER_SITE_OTHER): Payer: BC Managed Care – PPO

## 2023-09-03 DIAGNOSIS — J4550 Severe persistent asthma, uncomplicated: Secondary | ICD-10-CM | POA: Diagnosis not present

## 2023-09-03 MED ORDER — TEZEPELUMAB-EKKO 210 MG/1.91ML ~~LOC~~ SOSY
210.0000 mg | PREFILLED_SYRINGE | SUBCUTANEOUS | Status: AC
  Administered 2023-09-03 – 2024-10-22 (×16): 210 mg via SUBCUTANEOUS

## 2023-09-03 NOTE — Progress Notes (Signed)
Immunotherapy   Patient Details  Name: Hannah Hays MRN: [id number] Date of Birth: 06-10-1966  09/03/2023  [name] started injections for  Tezspire Following schedule: Every twenty eight days Frequency: Every four weeks Epi-Pen:Not needed.  Consent signed in person today and patient instructions given. Patient sat in her car for thirty minutes without an issue.    Ralene Muskrat 09/03/2023, 10:06 AM

## 2023-09-20 DIAGNOSIS — Z01419 Encounter for gynecological examination (general) (routine) without abnormal findings: Secondary | ICD-10-CM | POA: Diagnosis not present

## 2023-09-20 LAB — HM PAP SMEAR: HM Pap smear: NORMAL

## 2023-09-26 ENCOUNTER — Encounter: Payer: Self-pay | Admitting: Family Medicine

## 2023-09-26 ENCOUNTER — Ambulatory Visit (INDEPENDENT_AMBULATORY_CARE_PROVIDER_SITE_OTHER): Payer: BC Managed Care – PPO | Admitting: Family Medicine

## 2023-09-26 VITALS — BP 126/75 | HR 58 | Ht 65.0 in | Wt 155.0 lb

## 2023-09-26 DIAGNOSIS — Z23 Encounter for immunization: Secondary | ICD-10-CM | POA: Diagnosis not present

## 2023-09-26 DIAGNOSIS — J4551 Severe persistent asthma with (acute) exacerbation: Secondary | ICD-10-CM

## 2023-09-26 DIAGNOSIS — R7302 Impaired glucose tolerance (oral): Secondary | ICD-10-CM

## 2023-09-26 DIAGNOSIS — E785 Hyperlipidemia, unspecified: Secondary | ICD-10-CM

## 2023-09-26 DIAGNOSIS — E559 Vitamin D deficiency, unspecified: Secondary | ICD-10-CM

## 2023-09-26 DIAGNOSIS — I10 Essential (primary) hypertension: Secondary | ICD-10-CM

## 2023-09-26 DIAGNOSIS — R1319 Other dysphagia: Secondary | ICD-10-CM | POA: Diagnosis not present

## 2023-09-26 DIAGNOSIS — J310 Chronic rhinitis: Secondary | ICD-10-CM

## 2023-09-26 DIAGNOSIS — K219 Gastro-esophageal reflux disease without esophagitis: Secondary | ICD-10-CM

## 2023-09-26 DIAGNOSIS — E663 Overweight: Secondary | ICD-10-CM

## 2023-09-26 MED ORDER — ROSUVASTATIN CALCIUM 40 MG PO TABS: 40.0000 mg | ORAL_TABLET | Freq: Every day | ORAL | 1 refills | Status: AC

## 2023-09-26 MED ORDER — ESOMEPRAZOLE MAGNESIUM 40 MG PO CPDR: 40.0000 mg | DELAYED_RELEASE_CAPSULE | Freq: Every day | ORAL | 3 refills | Status: DC

## 2023-09-26 NOTE — Assessment & Plan Note (Addendum)
Deteriorated, inc dose crestor , eval in 4 months Hyperlipidemia:Low fat diet discussed and encouraged.   Lipid Panel  Lab Results  Component Value Date   CHOL 259 (H) 08/07/2023   HDL 49 08/07/2023   LDLCALC 165 (H) 08/07/2023   TRIG 244 (H) 08/07/2023   CHOLHDL 5.3 (H) 08/07/2023     Inc crestor dose

## 2023-09-26 NOTE — Patient Instructions (Addendum)
F/U in 4 months, call if you need me sooner  Flu vaccine in office today.  You are referred to Dr. Jena Gauss reviewed difficulty swallowing.  Please also start taking medication for reflux as prescribed.  Please change food choices you need to cut back on cheese bacon and sausage your cholesterol and triglycerides are extremely high.  You remain prediabetic, no change yet!  The dose of Crestor is increased to 40 mg.  You may take two  20 mg tablets until done.  Fasting lipid CMP and EGFR HbA1c and vitamin D to be checked 3 to 5 days before next appointment.in 4 months  It is important that you exercise regularly at least 30 minutes 5 times a week. If you develop chest pain, have severe difficulty breathing, or feel very tired, stop exercising immediately and seek medical attention

## 2023-10-01 ENCOUNTER — Ambulatory Visit (INDEPENDENT_AMBULATORY_CARE_PROVIDER_SITE_OTHER): Payer: BC Managed Care – PPO

## 2023-10-01 DIAGNOSIS — Z23 Encounter for immunization: Secondary | ICD-10-CM | POA: Insufficient documentation

## 2023-10-01 DIAGNOSIS — R1319 Other dysphagia: Secondary | ICD-10-CM | POA: Insufficient documentation

## 2023-10-01 DIAGNOSIS — J4550 Severe persistent asthma, uncomplicated: Secondary | ICD-10-CM

## 2023-10-01 NOTE — Progress Notes (Signed)
Hannah Hays     MRN: [id number]      DOB: 09/07/1966  Chief Complaint  Patient presents with   Follow-up    Follow up, trouble swallowing     HPI [name] is here for follow up and re-evaluation of chronic medical conditions, medication management and review of any available recent lab and radiology data.  Preventive health is updated, specifically  Cancer screening and Immunization.   Questions or concerns regarding consultations or procedures which the PT has had in the interim are  addressed. The PT denies any adverse reactions to current medications since the last visit.  C/o dysphagia ROS Denies recent fever or chills. Denies sinus pressure, nasal congestion, ear pain or sore throat. Denies chest congestion, productive cough or wheezing. Denies chest pains, palpitations and leg swelling Denies abdominal pain, nausea, vomiting,diarrhea or constipation.   Denies dysuria, frequency, hesitancy or incontinence. Denies joint pain, swelling and limitation in mobility. Denies headaches, seizures, numbness, or tingling. Denies depression, anxiety or insomnia. Denies skin break down or rash.   PE  BP 126/75 (BP Location: Right Arm, Patient Position: Sitting, Cuff Size: Large)   Pulse (!) 58   Ht 5\' 5"  (1.651 m)   Wt 155 lb (70.3 kg)   SpO2 97%   BMI 25.79 kg/m   Patient alert and oriented and in no cardiopulmonary distress.  HEENT: No facial asymmetry, EOMI,     Neck supple .  Chest: Clear to auscultation bilaterally.  CVS: S1, S2 no murmurs, no S3.Regular rate.  ABD: Soft non tender.   Ext: No edema  MS: Adequate ROM spine, shoulders, hips and knees.  Skin: Intact, no ulcerations or rash noted.  Psych: Good eye contact, normal affect. Memory intact not anxious or depressed appearing.  CNS: CN 2-12 intact, power,  normal throughout.no focal deficits noted.   Assessment & Plan  Hyperlipidemia LDL goal <100 Deteriorated, inc dose crestor , eval in 4  months Hyperlipidemia:Low fat diet discussed and encouraged.   Lipid Panel  Lab Results  Component Value Date   CHOL 259 (H) 08/07/2023   HDL 49 08/07/2023   LDLCALC 165 (H) 08/07/2023   TRIG 244 (H) 08/07/2023   CHOLHDL 5.3 (H) 08/07/2023     Inc crestor dose  HTN (hypertension) Controlled, no change in medication DASH diet and commitment to daily physical activity for a minimum of 30 minutes discussed and encouraged, as a part of hypertension management. The importance of attaining a healthy weight is also discussed.     09/26/2023    8:58 AM 07/04/2023   11:41 AM 03/28/2023   10:14 AM 01/26/2023    9:45 AM 11/15/2022    1:20 PM 10/25/2022    3:08 PM 10/10/2022    8:34 AM  BP/Weight  Systolic BP 126 118 118 122 130 138   Diastolic BP 75 70 68 72 80 70   Wt. (Lbs) 155 154.6 151.8 152 152.8 154.5 149  BMI 25.79 kg/m2 25.6 kg/m2 24.88 kg/m2 25.29 kg/m2 25.43 kg/m2 25.71 kg/m2 24.79 kg/m2       GERD (gastroesophageal reflux disease) Uncontrolled , not taking meds as prescribed, caffeine intake is limited, needs GI re  eval  Esophageal dysphagia Increased difficulty swallowing solids and liquids x 4 months, refwer GI  Asthma Controlled, no change in medication Managed by allergist  Chronic nonallergic rhinitis Controlled, no change in medication   IGT (impaired glucose tolerance) Patient educated about the importance of limiting  Carbohydrate intake , the  need to commit to daily physical activity for a minimum of 30 minutes , and to commit weight loss. The fact that changes in all these areas will reduce or eliminate all together the development of diabetes is stressed.  Deteriorated      Latest Ref Rng & Units 08/07/2023    9:47 AM 01/26/2023   10:34 AM 07/26/2022    8:42 AM 01/26/2022    8:44 AM 07/21/2021    8:44 AM  Diabetic Labs  HbA1c 4.8 - 5.6 % 6.2  6.1  6.1  6.3  6.2   Chol 100 - 199 mg/dL 161  096   045  409   HDL >39 mg/dL 49  51   48  52   Calc  LDL 0 - 99 mg/dL 811  914   87  70   Triglycerides 0 - 149 mg/dL 782  956   213  49   Creatinine 0.57 - 1.00 mg/dL 0.86  5.78  4.69  6.29  0.97       09/26/2023    8:58 AM 07/04/2023   11:41 AM 03/28/2023   10:14 AM 01/26/2023    9:45 AM 11/15/2022    1:20 PM 10/25/2022    3:08 PM 10/10/2022    8:34 AM  BP/Weight  Systolic BP 126 118 118 122 130 138   Diastolic BP 75 70 68 72 80 70   Wt. (Lbs) 155 154.6 151.8 152 152.8 154.5 149  BMI 25.79 kg/m2 25.6 kg/m2 24.88 kg/m2 25.29 kg/m2 25.43 kg/m2 25.71 kg/m2 24.79 kg/m2       No data to display            Vitamin D deficiency Updated lab needed at/ before next visit.

## 2023-10-01 NOTE — Assessment & Plan Note (Signed)
Controlled, no change in medication Managed by allergist

## 2023-10-01 NOTE — Assessment & Plan Note (Signed)
Updated lab needed at/ before next visit.   

## 2023-10-01 NOTE — Assessment & Plan Note (Signed)
Uncontrolled , not taking meds as prescribed, caffeine intake is limited, needs GI re  eval

## 2023-10-01 NOTE — Assessment & Plan Note (Signed)
Increased difficulty swallowing solids and liquids x 4 months, refwer GI

## 2023-10-01 NOTE — Assessment & Plan Note (Signed)
Patient educated about the importance of limiting  Carbohydrate intake , the need to commit to daily physical activity for a minimum of 30 minutes , and to commit weight loss. The fact that changes in all these areas will reduce or eliminate all together the development of diabetes is stressed.  Deteriorated      Latest Ref Rng & Units 08/07/2023    9:47 AM 01/26/2023   10:34 AM 07/26/2022    8:42 AM 01/26/2022    8:44 AM 07/21/2021    8:44 AM  Diabetic Labs  HbA1c 4.8 - 5.6 % 6.2  6.1  6.1  6.3  6.2   Chol 100 - 199 mg/dL 295  188   416  606   HDL >39 mg/dL 49  51   48  52   Calc LDL 0 - 99 mg/dL 301  601   87  70   Triglycerides 0 - 149 mg/dL 093  235   573  49   Creatinine 0.57 - 1.00 mg/dL 2.20  2.54  2.70  6.23  0.97       09/26/2023    8:58 AM 07/04/2023   11:41 AM 03/28/2023   10:14 AM 01/26/2023    9:45 AM 11/15/2022    1:20 PM 10/25/2022    3:08 PM 10/10/2022    8:34 AM  BP/Weight  Systolic BP 126 118 118 122 130 138   Diastolic BP 75 70 68 72 80 70   Wt. (Lbs) 155 154.6 151.8 152 152.8 154.5 149  BMI 25.79 kg/m2 25.6 kg/m2 24.88 kg/m2 25.29 kg/m2 25.43 kg/m2 25.71 kg/m2 24.79 kg/m2       No data to display

## 2023-10-01 NOTE — Assessment & Plan Note (Signed)
Controlled, no change in medication DASH diet and commitment to daily physical activity for a minimum of 30 minutes discussed and encouraged, as a part of hypertension management. The importance of attaining a healthy weight is also discussed.     09/26/2023    8:58 AM 07/04/2023   11:41 AM 03/28/2023   10:14 AM 01/26/2023    9:45 AM 11/15/2022    1:20 PM 10/25/2022    3:08 PM 10/10/2022    8:34 AM  BP/Weight  Systolic BP 126 118 118 122 130 138   Diastolic BP 75 70 68 72 80 70   Wt. (Lbs) 155 154.6 151.8 152 152.8 154.5 149  BMI 25.79 kg/m2 25.6 kg/m2 24.88 kg/m2 25.29 kg/m2 25.43 kg/m2 25.71 kg/m2 24.79 kg/m2

## 2023-10-01 NOTE — Assessment & Plan Note (Signed)
Controlled, no change in medication  

## 2023-10-04 ENCOUNTER — Encounter: Payer: Self-pay | Admitting: *Deleted

## 2023-10-04 ENCOUNTER — Ambulatory Visit: Payer: BC Managed Care – PPO | Admitting: Internal Medicine

## 2023-10-04 ENCOUNTER — Encounter: Payer: Self-pay | Admitting: Internal Medicine

## 2023-10-04 VITALS — BP 136/72 | HR 76 | Temp 98.5°F | Ht 65.0 in | Wt 152.2 lb

## 2023-10-04 DIAGNOSIS — Q394 Esophageal web: Secondary | ICD-10-CM

## 2023-10-04 DIAGNOSIS — K219 Gastro-esophageal reflux disease without esophagitis: Secondary | ICD-10-CM | POA: Diagnosis not present

## 2023-10-04 DIAGNOSIS — R1319 Other dysphagia: Secondary | ICD-10-CM

## 2023-10-04 NOTE — Progress Notes (Signed)
Referring Provider: Kerri Perches, MD Primary Care Physician:  Kerri Perches, MD Primary GI:  Dr. Marletta Lor  Chief Complaint  Patient presents with   New Patient (Initial Visit)    Pt has had trouble swallowing about 7 months worsening last month or so    HPI:   Hannah Hays is a 57 y.o. female who presents to the clinic today for follow-up visit.  History of chronic GERD historically well-controlled on esomeprazole 20 mg as needed.  States she is needed to take this daily for the past month or so given breakthrough symptoms.  Continues to have occasional acid reflux, primarily at night.  Feels like food regurgitates into her esophagus.  Also notes progressively worsening esophageal dysphagia.  Feels like food and thick liquids are getting stuck in her esophagus.  No epigastric or chest pain.  Occasionally will take ibuprofen for her migraines.  Does note she was on a course of steroids within the past year as well.  Last EGD 2014 with distal esophageal web status post dilation.  States this helped her symptoms.  Esophageal biopsies negative for EOE.  Colonoscopy 06/14/2021 nonbleeding internal hemorrhoids, otherwise unremarkable.  Recommended 10-year recall.  Past Medical History:  Diagnosis Date   Allergic rhinitis, seasonal    Anxiety    Asthma    Depression    GERD (gastroesophageal reflux disease)    Helicobacter pylori gastritis 12/05/2007   s/p Prevpac treatment   History of colonoscopy 12/04/2006   with simple adenoma   Hypertension    IBS (irritable bowel syndrome)    pre-dominant diarrhea   Migraine headache 17   recurrence 1st time in 10 year in 2011    Past Surgical History:  Procedure Laterality Date   ABDOMINAL HYSTERECTOMY     CHOLECYSTECTOMY     COLONOSCOPY  11/13/2007   SLF:A 4 mm sessile transverse colon polyp/Random biopsies obtained throughout the colon/Otherwise normal. Negative for microscopic colitis, simple adenoma, due for  surveillance 11/2017 per [facility]   COLONOSCOPY N/A 02/02/2016   Procedure: COLONOSCOPY;  Surgeon: West Bali, MD;  Location: [facility];  Service: Endoscopy;  Laterality: N/A;  12:15 PM   COLONOSCOPY WITH PROPOFOL N/A 06/14/2021   Procedure: COLONOSCOPY WITH PROPOFOL;  Surgeon: Lanelle Bal, DO;  Location: [facility];  Service: Endoscopy;  Laterality: N/A;  ASA II / 12:45   ESOPHAGOGASTRODUODENOSCOPY  03/20/2008   SLF:A 1- to 2-cm hiatal hernia, mild erythema in the antrum/Normal esophagus without evidence of Barrett's, +H.pylori s/p Prevapc treatment    ESOPHAGOGASTRODUODENOSCOPY (EGD) WITH ESOPHAGEAL DILATION N/A 02/14/2013   YIR:SWNIOE Esophageal web/MILD Non-erosive gastritis (inflammation)   PARTIAL HYSTERECTOMY  2002   SEPTOPLASTY N/A 08/16/2015   Procedure: SEPTOPLASTY;  Surgeon: Newman Pies, MD;  Location: [facility];  Service: ENT;  Laterality: N/A;   SINUS ENDO WITH FUSION Bilateral 08/16/2015   Procedure: ETHMOIDECTOMY, SPHENOIDECTOMY, MAXILLARY ANTROSTOMY, FRONTAL RECESS EXPLORATION WITH FUSION NAVIGATION;  Surgeon: Newman Pies, MD;  Location: [facility];  Service: ENT;  Laterality: Bilateral;   TUBAL LIGATION  1995    Current Outpatient Medications  Medication Sig Dispense Refill   albuterol (VENTOLIN HFA) 108 (90 Base) MCG/ACT inhaler INHALE 2 PUFFS INTO THE LUNGS EVERY 6 HOURS AS NEEDED FOR WHEEZING OR SHORTNESS OF BREATH 18 g 1   amLODipine (NORVASC) 10 MG tablet Take 1 tablet (10 mg total) by mouth daily. 90 tablet 3   Benralizumab (FASENRA) 30 MG/ML SOSY INJECT 1 SYRINGE  UNDER THE SKIN EVERY 8 WEEKS. 1 mL 7   Budeson-Glycopyrrol-Formoterol (BREZTRI AEROSPHERE) 160-9-4.8 MCG/ACT AERO Inhale 2 puffs into the lungs in the morning and at bedtime. 1 each 5   Calcium Carbonate-Vitamin D (CALCIUM 600 + D PO) Take 1 tablet by mouth 2 (two) times daily.     EPINEPHrine 0.3 mg/0.3 mL IJ SOAJ injection Inject 0.3 mg into the muscle as needed for anaphylaxis.  1 each 1   esomeprazole (NEXIUM) 40 MG capsule Take 1 capsule (40 mg total) by mouth daily. 30 capsule 3   ezetimibe (ZETIA) 10 MG tablet Take 1 tablet (10 mg total) by mouth daily. 90 tablet 3   fluticasone (FLONASE) 50 MCG/ACT nasal spray Place 1 spray into both nostrils daily. 48 g 1   ibuprofen (ADVIL) 800 MG tablet Take 1 tablet (800 mg total) by mouth every 8 (eight) hours as needed for headache. 20 tablet 1   montelukast (SINGULAIR) 10 MG tablet Take 1 tablet (10 mg total) by mouth at bedtime. 90 tablet 1   Multiple Vitamin (MULTIVITAMIN WITH MINERALS) TABS Take 1 tablet by mouth at bedtime.      rizatriptan (MAXALT) 5 MG tablet Take 1 tablet (5 mg total) by mouth as needed for migraine. May repeat in 2 hours if needed 10 tablet 0   rosuvastatin (CRESTOR) 40 MG tablet Take 1 tablet (40 mg total) by mouth daily. 90 tablet 1   spironolactone (ALDACTONE) 50 MG tablet Take 1 tablet (50 mg total) by mouth daily. 90 tablet 3   cetirizine (ZYRTEC) 10 MG tablet Take 1 tablet (10 mg total) by mouth daily as needed for allergies. 90 tablet 1   Current Facility-Administered Medications  Medication Dose Route Frequency Provider Last Rate Last Admin   Benralizumab SOSY 30 mg  30 mg Subcutaneous Q28 days Alfonse Spruce, MD   30 mg at 03/28/23 1057   tezepelumab-ekko (TEZSPIRE) 210 MG/1. syringe 210 mg  210 mg Subcutaneous Q28 days Alfonse Spruce, MD   210 mg at 10/01/23 1610    Allergies as of 10/04/2023 - Review Complete 10/04/2023  Allergen Reaction Noted   Pineapple Other (See Comments) 06/07/2021   Bacid Rash 02/08/2011    Family History  Problem Relation Age of Onset   Hypertension Mother    Heart disease Mother    Diabetes Mother    Alcohol abuse Father    Hypertension Father    Stroke Father    Hypertension Sister    Kidney disease Sister    Diabetes Sister    Hypertension Sister    Diabetes Sister    Diabetes Brother    Heart disease Brother    Hypertension  Brother    Colon cancer Neg Hx     Social History   Socioeconomic History   Marital status: Married    Spouse name: Not on file   Number of children: 2   Years of education: Not on file   Highest education level: Not on file  Occupational History   Occupation: employed equity     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[employer]: [employer]  Tobacco Use   Smoking status: Never    Passive exposure: Never   Smokeless tobacco: Never  Vaping Use   Vaping status: Never Used  Substance and Sexual Activity   Alcohol use: No   Drug use: No   Sexual activity: Yes    Birth control/protection: Surgical  Other Topics Concern   Not  on file  Social History Narrative   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   Social Determinants of Health   Financial Resource Strain: Not on file  Food Insecurity: Not on file  Transportation Needs: Not on file  Physical Activity: Not on file  Stress: Not on file  Social Connections: Not on file    Subjective: Review of Systems  Constitutional:  Negative for chills and fever.  HENT:  Negative for congestion and hearing loss.   Eyes:  Negative for blurred vision and double vision.  Respiratory:  Negative for cough and shortness of breath.   Cardiovascular:  Negative for chest pain and palpitations.  Gastrointestinal:  Positive for heartburn. Negative for abdominal pain, blood in stool, constipation, diarrhea, melena and vomiting.       Dysphagia  Genitourinary:  Negative for dysuria and urgency.  Musculoskeletal:  Negative for joint pain and myalgias.  Skin:  Negative for itching and rash.  Neurological:  Negative for dizziness and headaches.  Psychiatric/Behavioral:  Negative for depression. The patient is not nervous/anxious.      Objective: BP 136/72   Pulse 76   Temp 98.5 F (36.9 C)   Ht 5\' 5"  (1.651 m)   Wt 152 lb 3.2 oz (69 kg)   BMI 25.33 kg/m  Physical Exam Constitutional:      Appearance: Normal appearance.  HENT:     Head:  Normocephalic and atraumatic.  Eyes:     Extraocular Movements: Extraocular movements intact.     Conjunctiva/sclera: Conjunctivae normal.  Cardiovascular:     Rate and Rhythm: Normal rate and regular rhythm.  Pulmonary:     Effort: Pulmonary effort is normal.     Breath sounds: Normal breath sounds.  Abdominal:     General: Bowel sounds are normal.     Palpations: Abdomen is soft.  Musculoskeletal:        General: No swelling. Normal range of motion.     Cervical back: Normal range of motion and neck supple.  Skin:    General: Skin is warm and dry.     Coloration: Skin is not jaundiced.  Neurological:     General: No focal deficit present.     Mental Status: She is alert and oriented to person, place, and time.  Psychiatric:        Mood and Affect: Mood normal.        Behavior: Behavior normal.      Assessment: *Chronic GERD-not well controlled on esomeprazole 20 mg daily *Esophageal dysphagia-worsening *Esophageal web  Plan: Will schedule for EGD with possible dilation to evaluate for peptic ulcer disease, esophagitis, gastritis, H. Pylori, duodenitis, or other. Will also evaluate for esophageal stricture, Schatzki's ring, esophageal web or other.   The risks including infection, bleed, or perforation as well as benefits, limitations, alternatives and imponderables have been reviewed with the patient. Potential for esophageal dilation, biopsy, etc. have also been reviewed.  Questions have been answered. All parties agreeable.  Continue on esomeprazole 20 mg daily for now may make adjustments pending endoscopic findings.   10/04/2023 11:26 AM   Disclaimer: This note was dictated with voice recognition software. Similar sounding words can inadvertently be transcribed and may not be corrected upon review.

## 2023-10-04 NOTE — Patient Instructions (Addendum)
1. Moderate persistent asthma, uncomplicated - Daily controller medication(s): Breztri two puffs twice daily with spacer + Singulair 10mg  daily -Continue tezspire injections once a month for control of asthma symptoms - Rescue medications: albuterol 2 puffs every 4-6 hours as needed - Asthma control goals:  * Full participation in all desired activities (may need albuterol before activity) * Albuterol use two time or less a week on average (not counting use with activity) * Cough interfering with sleep two time or less a month * Oral steroids no more than once a year * No hospitalizations  2. Perennial allergic rhinitis  - Continue with Flonase Sensimist two puffs once daily as needed for stuffy nose.   - Continue with nasal saline rinses 1-2 times daily.  - Continue with Singulair 10mg  daily. - Continue Zyrtec 10 mg and Xyzal 5mg  at night.  -Begin.  She is having some nasal saline gel as needed for dry nostrils  3.  Food allergy Continue to avoid pineapple.  In case of an allergic reaction, take Benadryl 50 mg every 4 hours, and if life-threatening symptoms occur, inject with EpiPen 0.3 mg.  4.  Epistaxis Pinch both nostrils while leaning forward for at least 5 minutes before checking to see if the bleeding has stopped. If bleeding is not controlled within 5-10 minutes apply a cotton ball soaked with oxymetazoline (Afrin) to the bleeding nostril for a few seconds.  If the problem persists or worsens a referral to ENT for further evaluation may be necessary.   5. EOE Continue to follow-up with your GI specialist.  Please let us know the date if your surgical appointment with your GI specialist  6. Nasal polyposis Continue Flonase Sensimist as tolerated We could consider changing your biologic to Dupixent for control of nasal polyposis.  Call the clinic if this treatment plan is not working well for you.  Follow up in 3 months or sooner if needed.

## 2023-10-04 NOTE — Patient Instructions (Signed)
We will schedule you for upper endoscopy to further evaluate your chronic reflux as well as difficulty with foods getting stuck in your esophagus.  I may elect to stretch your esophagus depending on findings.  Continue on Nexium daily for now.  We may make adjustments pending endoscopic findings.  It was very nice seeing you again today.  Dr. Marletta Lor

## 2023-10-05 ENCOUNTER — Encounter: Payer: Self-pay | Admitting: Family Medicine

## 2023-10-05 ENCOUNTER — Ambulatory Visit (INDEPENDENT_AMBULATORY_CARE_PROVIDER_SITE_OTHER): Payer: BC Managed Care – PPO | Admitting: Family Medicine

## 2023-10-05 ENCOUNTER — Other Ambulatory Visit: Payer: Self-pay

## 2023-10-05 VITALS — BP 120/72 | HR 86 | Temp 97.5°F | Ht 65.16 in | Wt 154.6 lb

## 2023-10-05 DIAGNOSIS — J4550 Severe persistent asthma, uncomplicated: Secondary | ICD-10-CM | POA: Diagnosis not present

## 2023-10-05 DIAGNOSIS — T7800XA Anaphylactic reaction due to unspecified food, initial encounter: Secondary | ICD-10-CM

## 2023-10-05 DIAGNOSIS — T7800XD Anaphylactic reaction due to unspecified food, subsequent encounter: Secondary | ICD-10-CM

## 2023-10-05 DIAGNOSIS — J3089 Other allergic rhinitis: Secondary | ICD-10-CM | POA: Diagnosis not present

## 2023-10-05 DIAGNOSIS — R040 Epistaxis: Secondary | ICD-10-CM | POA: Diagnosis not present

## 2023-10-05 DIAGNOSIS — J339 Nasal polyp, unspecified: Secondary | ICD-10-CM

## 2023-10-05 DIAGNOSIS — K200 Eosinophilic esophagitis: Secondary | ICD-10-CM

## 2023-10-05 MED ORDER — BREZTRI AEROSPHERE 160-9-4.8 MCG/ACT IN AERO: 2.0000 | INHALATION_SPRAY | Freq: Two times a day (BID) | RESPIRATORY_TRACT | 5 refills | Status: DC

## 2023-10-05 MED ORDER — CETIRIZINE HCL 10 MG PO TABS: 10.0000 mg | ORAL_TABLET | Freq: Every day | ORAL | 1 refills | Status: AC | PRN

## 2023-10-05 MED ORDER — EPINEPHRINE 0.3 MG/0.3ML IJ SOAJ: 0.3000 mg | INTRAMUSCULAR | 1 refills | Status: AC | PRN

## 2023-10-05 MED ORDER — MONTELUKAST SODIUM 10 MG PO TABS: 10.0000 mg | ORAL_TABLET | Freq: Every day | ORAL | 1 refills | Status: DC

## 2023-10-05 MED ORDER — LEVOCETIRIZINE DIHYDROCHLORIDE 5 MG PO TABS: 5.0000 mg | ORAL_TABLET | Freq: Every evening | ORAL | 1 refills | Status: DC

## 2023-10-05 NOTE — Addendum Note (Signed)
Addended by: Elsworth Soho on: 10/05/2023 04:41 PM   Modules accepted: Orders

## 2023-10-18 ENCOUNTER — Encounter: Payer: Self-pay | Admitting: *Deleted

## 2023-10-18 ENCOUNTER — Telehealth: Payer: Self-pay | Admitting: *Deleted

## 2023-10-18 NOTE — Telephone Encounter (Signed)
Pt called back to schedule her procedure.  Pt has been scheduled for 11/26/23. Instructions a mailed.  Availity PA for [employer] [place] :Based on the request details, this procedure does not require prior authorization. Use confirmation [id number] for future reference when contacting Provider Services.

## 2023-10-29 ENCOUNTER — Ambulatory Visit (INDEPENDENT_AMBULATORY_CARE_PROVIDER_SITE_OTHER): Payer: BC Managed Care – PPO

## 2023-10-29 DIAGNOSIS — J4550 Severe persistent asthma, uncomplicated: Secondary | ICD-10-CM | POA: Diagnosis not present

## 2023-11-22 ENCOUNTER — Encounter (HOSPITAL_COMMUNITY)
Admission: RE | Admit: 2023-11-22 | Discharge: 2023-11-22 | Disposition: A | Discharge status: Home / Self Care | Payer: BC Managed Care – PPO | Source: Ambulatory Visit | Attending: Internal Medicine | Admitting: Internal Medicine

## 2023-11-22 ENCOUNTER — Encounter (HOSPITAL_COMMUNITY): Payer: Self-pay

## 2023-11-22 ENCOUNTER — Other Ambulatory Visit: Payer: Self-pay

## 2023-11-22 VITALS — Ht 65.15 in | Wt 154.5 lb

## 2023-11-22 DIAGNOSIS — I10 Essential (primary) hypertension: Secondary | ICD-10-CM

## 2023-11-26 ENCOUNTER — Encounter (HOSPITAL_COMMUNITY)
Admission: RE | Disposition: A | Discharge status: Home / Self Care | Payer: Self-pay | Source: Home / Self Care | Attending: Internal Medicine

## 2023-11-26 ENCOUNTER — Ambulatory Visit (HOSPITAL_COMMUNITY)
Admission: RE | Admit: 2023-11-26 | Discharge: 2023-11-26 | Disposition: A | Discharge status: Home / Self Care | Payer: BC Managed Care – PPO | Attending: Internal Medicine | Admitting: Internal Medicine

## 2023-11-26 ENCOUNTER — Ambulatory Visit (INDEPENDENT_AMBULATORY_CARE_PROVIDER_SITE_OTHER): Payer: BC Managed Care – PPO

## 2023-11-26 ENCOUNTER — Ambulatory Visit (HOSPITAL_COMMUNITY): Payer: BC Managed Care – PPO | Admitting: Anesthesiology

## 2023-11-26 DIAGNOSIS — Z79899 Other long term (current) drug therapy: Secondary | ICD-10-CM | POA: Insufficient documentation

## 2023-11-26 DIAGNOSIS — J45909 Unspecified asthma, uncomplicated: Secondary | ICD-10-CM | POA: Insufficient documentation

## 2023-11-26 DIAGNOSIS — Q394 Esophageal web: Secondary | ICD-10-CM | POA: Diagnosis not present

## 2023-11-26 DIAGNOSIS — R12 Heartburn: Secondary | ICD-10-CM | POA: Insufficient documentation

## 2023-11-26 DIAGNOSIS — R1310 Dysphagia, unspecified: Secondary | ICD-10-CM | POA: Diagnosis not present

## 2023-11-26 DIAGNOSIS — I10 Essential (primary) hypertension: Secondary | ICD-10-CM

## 2023-11-26 DIAGNOSIS — J4550 Severe persistent asthma, uncomplicated: Secondary | ICD-10-CM | POA: Diagnosis not present

## 2023-11-26 DIAGNOSIS — K219 Gastro-esophageal reflux disease without esophagitis: Secondary | ICD-10-CM | POA: Insufficient documentation

## 2023-11-26 DIAGNOSIS — K2970 Gastritis, unspecified, without bleeding: Secondary | ICD-10-CM | POA: Insufficient documentation

## 2023-11-26 SURGERY — ESOPHAGOGASTRODUODENOSCOPY (EGD) WITH PROPOFOL
Anesthesia: General

## 2023-11-26 MED ORDER — PANTOPRAZOLE SODIUM 40 MG PO TBEC: 40.0000 mg | DELAYED_RELEASE_TABLET | Freq: Two times a day (BID) | ORAL | 11 refills | Status: AC

## 2023-11-26 MED ORDER — LIDOCAINE HCL (CARDIAC) PF 100 MG/5ML IV SOSY
PREFILLED_SYRINGE | INTRAVENOUS | Status: DC | PRN
  Administered 2023-11-26: 100 mg via INTRATRACHEAL

## 2023-11-26 MED ORDER — PROPOFOL 10 MG/ML IV BOLUS
INTRAVENOUS | Status: DC | PRN
  Administered 2023-11-26: 20 mg via INTRAVENOUS
  Administered 2023-11-26: 70 mg via INTRAVENOUS

## 2023-11-26 MED ORDER — LACTATED RINGERS IV SOLN: INTRAVENOUS | Status: DC | PRN

## 2023-11-26 NOTE — H&P (Signed)
Primary Care Physician:  Kerri Perches, MD Primary Gastroenterologist:  Dr. Marletta Lor  Pre-Procedure History & Physical: HPI:  Hannah Hays is a 57 y.o. female is here for an EGD with possible dilation due to history of dysphagia, GERD  Past Medical History:  Diagnosis Date   Allergic rhinitis, seasonal    Anxiety    Asthma    Depression    GERD (gastroesophageal reflux disease)    Helicobacter pylori gastritis 12/05/2007   s/p Prevpac treatment   History of colonoscopy 12/04/2006   with simple adenoma   Hypertension    IBS (irritable bowel syndrome)    pre-dominant diarrhea   Migraine headache 17   recurrence 1st time in 10 year in 2011    Past Surgical History:  Procedure Laterality Date   ABDOMINAL HYSTERECTOMY     CHOLECYSTECTOMY     COLONOSCOPY  11/13/2007   SLF:A 4 mm sessile transverse colon polyp/Random biopsies obtained throughout the colon/Otherwise normal. Negative for microscopic colitis, simple adenoma, due for surveillance 11/2017 per SL[facility]   COLONOSCOPY N/A 02/02/2016   Procedure: COLONOSCOPY;  Surgeon: West Bali, MD;  [facility];  Service: Endoscopy;  Laterality: N/A;  12:15 PM   COLONOSCOPY WITH PROPOFOL N/A 06/14/2021   Procedure: COLONOSCOPY WITH PROPOFOL;  Surgeon: Lanelle Bal, DO;  Location: [facility];  Service: Endoscopy;  Laterality: N/A;  ASA II / 12:45   ESOPHAGOGASTRODUODENOSCOPY  03/20/2008   SLF:A 1- to 2-cm hiatal hernia, mild erythema in the antrum/Normal esophagus without evidence of Barrett's, +H.pylori s/p Prevapc treatment    ESOPHAGOGASTRODUODENOSCOPY (EGD) WITH ESOPHAGEAL DILATION N/A 02/14/2013   YQI:HKVQQV Esophageal web/MILD Non-erosive gastritis (inflammation)   PARTIAL HYSTERECTOMY  2002   SEPTOPLASTY N/A 08/16/2015   Procedure: SEPTOPLASTY;  Surgeon: Newman Pies, MD;  Location: [facility];  Service: ENT;  Laterality: N/A;   SINUS ENDO WITH FUSION Bilateral 08/16/2015   Procedure: ETHMOIDECTOMY,  SPHENOIDECTOMY, MAXILLARY ANTROSTOMY, FRONTAL RECESS EXPLORATION WITH FUSION NAVIGATION;  Surgeon: Newman Pies, MD;  Location: [facility];  Service: ENT;  Laterality: Bilateral;   TUBAL LIGATION  1995    Prior to Admission medications   Medication Sig Start Date End Date Taking? Authorizing Provider  albuterol (VENTOLIN HFA) 108 (90 Base) MCG/ACT inhaler INHALE 2 PUFFS INTO THE LUNGS EVERY 6 HOURS AS NEEDED FOR WHEEZING OR SHORTNESS OF BREATH 09/04/22  Yes Nehemiah Settle, FNP  amLODipine (NORVASC) 10 MG tablet Take 1 tablet (10 mg total) by mouth daily. 07/26/22  Yes Kerri Perches, MD  Benralizumab [facility]) 30 MG/ML SOSY INJECT 1 SYRINGE UNDER THE SKIN EVERY 8 WEEKS. 12/13/22  Yes Alfonse Spruce, MD  Budeson-Glycopyrrol-Formoterol (BREZTRI AEROSPHERE) 160-9-4.8 MCG/ACT AERO Inhale 2 puffs into the lungs in the morning and at bedtime. 10/05/23  Yes Ambs, Norvel Richards, FNP  Calcium Carbonate-Vitamin D (CALCIUM 600 + D PO) Take 1 tablet by mouth 2 (two) times daily.   Yes [provider]  cetirizine (ZYRTEC) 10 MG tablet Take 1 tablet (10 mg total) by mouth daily as needed for allergies. 10/05/23  Yes Ambs, Norvel Richards, FNP  esomeprazole (NEXIUM) 40 MG capsule Take 1 capsule (40 mg total) by mouth daily. 09/26/23  Yes Kerri Perches, MD  ezetimibe (ZETIA) 10 MG tablet Take 1 tablet (10 mg total) by mouth daily. 08/19/23  Yes Kerri Perches, MD  fluticasone (FLONASE) 50 MCG/ACT nasal spray Place 1 spray into both nostrils daily. 03/28/23  Yes Alfonse Spruce, MD  ibuprofen (ADVIL) 800 MG tablet Take 1 tablet (800 mg total) by mouth every 8 (eight) hours as needed for headache. 04/22/20  Yes Freddy Finner, NP  levocetirizine (XYZAL ALLERGY 24HR) 5 MG tablet Take 1 tablet (5 mg total) by mouth every evening. 10/05/23  Yes Ambs, Norvel Richards, FNP  montelukast (SINGULAIR) 10 MG tablet Take 1 tablet (10 mg total) by mouth at bedtime. 10/05/23  Yes Ambs, Norvel Richards, FNP  Multiple  Vitamin (MULTIVITAMIN WITH MINERALS) TABS Take 1 tablet by mouth at bedtime.    Yes [provider]  rizatriptan (MAXALT) 5 MG tablet Take 1 tablet (5 mg total) by mouth as needed for migraine. May repeat in 2 hours if needed 04/22/20  Yes Freddy Finner, NP  rosuvastatin (CRESTOR) 40 MG tablet Take 1 tablet (40 mg total) by mouth daily. 09/26/23  Yes Kerri Perches, MD  spironolactone (ALDACTONE) 50 MG tablet Take 1 tablet (50 mg total) by mouth daily. 01/26/23  Yes Kerri Perches, MD  EPINEPHrine 0.3 mg/0.3 mL IJ SOAJ injection Inject 0.3 mg into the muscle as needed for anaphylaxis. 10/05/23   Hetty Blend, FNP    Allergies as of 10/18/2023 - Review Complete 10/05/2023  Allergen Reaction Noted   Pineapple Other (See Comments) 06/07/2021   Bacid Rash 02/08/2011    Family History  Problem Relation Age of Onset   Hypertension Mother    Heart disease Mother    Diabetes Mother    Alcohol abuse Father    Hypertension Father    Stroke Father    Hypertension Sister    Kidney disease Sister    Diabetes Sister    Hypertension Sister    Diabetes Sister    Diabetes Brother    Heart disease Brother    Hypertension Brother    Colon cancer Neg Hx     Social History   Socioeconomic History   Marital status: Married    Spouse name: Not on file   Number of children: 2   Years of education: Not on file   Highest education level: Not on file  Occupational History   Occupation: employed equity     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[employer]: [employer]  Tobacco Use   Smoking status: Never    Passive exposure: Never   Smokeless tobacco: Never  Vaping Use   Vaping status: Never Used  Substance and Sexual Activity   Alcohol use: No   Drug use: No   Sexual activity: Yes    Birth control/protection: Surgical  Other Topics Concern   Not on file  Social History Narrative   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   Social Drivers of Health   Financial Resource  Strain: Not on file  Food Insecurity: Not on file  Transportation Needs: Not on file  Physical Activity: Not on file  Stress: Not on file  Social Connections: Not on file  Intimate Partner Violence: Not on file    Review of Systems: General: Negative for fever, chills, fatigue, weakness. Eyes: Negative for vision changes.  ENT: Negative for hoarseness, difficulty swallowing , nasal congestion. CV: Negative for chest pain, angina, palpitations, dyspnea on exertion, peripheral edema.  Respiratory: Negative for dyspnea at rest, dyspnea on exertion, cough, sputum, wheezing.  GI: See history of present illness. GU:  Negative for dysuria, hematuria, urinary incontinence, urinary frequency, nocturnal urination.  MS: Negative for joint pain, low back pain.  Derm: Negative for rash or itching.  Neuro: Negative for weakness, abnormal sensation, seizure, frequent headaches, memory loss, confusion.  Psych: Negative for anxiety, depression Endo: Negative for unusual weight change.  Heme: Negative for bruising or bleeding. Allergy: Negative for rash or hives.  Physical Exam: Vital signs in last 24 hours: Temp:  [98.3 F (36.8 C)] 98.3 F (36.8 C) (12/23 0806) Pulse Rate:  [48] 48 (12/23 0806) Resp:  [24] 24 (12/23 0806) BP: (115)/(55) 115/55 (12/23 0806) SpO2:  [99 %] 99 % (12/23 0806)   General:   Alert,  Well-developed, well-nourished, pleasant and cooperative in NAD Head:  Normocephalic and atraumatic. Eyes:  Sclera clear, no icterus.   Conjunctiva pink. Ears:  Normal auditory acuity. Nose:  No deformity, discharge,  or lesions. Msk:  Symmetrical without gross deformities. Normal posture. Extremities:  Without clubbing or edema. Neurologic:  Alert and  oriented x4;  grossly normal neurologically. Skin:  Intact without significant lesions or rashes. Psych:  Alert and cooperative. Normal mood and affect.   Impression/Plan: Hannah Hays is here for an EGD with possible dilation  due to history of dysphagia, GERD  Risks, benefits, limitations, imponderables and alternatives regarding procedure have been reviewed with the patient. Questions have been answered. All parties agreeable.

## 2023-11-26 NOTE — Anesthesia Preprocedure Evaluation (Signed)
Anesthesia Evaluation  Patient identified by MRN, date of birth, ID band Patient awake    Reviewed: Allergy & Precautions, H&P , NPO status , Patient's Chart, lab work & pertinent test results, reviewed documented beta blocker date and time   Airway Mallampati: II  TM Distance: >3 FB Neck ROM: full    Dental no notable dental hx.    Pulmonary neg pulmonary ROS, asthma    Pulmonary exam normal breath sounds clear to auscultation       Cardiovascular Exercise Tolerance: Good hypertension, negative cardio ROS  Rhythm:regular Rate:Normal     Neuro/Psych  Headaches PSYCHIATRIC DISORDERS Anxiety Depression    negative neurological ROS  negative psych ROS   GI/Hepatic negative GI ROS, Neg liver ROS,GERD  ,,  Endo/Other  negative endocrine ROS    Renal/GU negative Renal ROS  negative genitourinary   Musculoskeletal   Abdominal   Peds  Hematology negative hematology ROS (+)   Anesthesia Other Findings   Reproductive/Obstetrics negative OB ROS                             Anesthesia Physical Anesthesia Plan  ASA: 3  Anesthesia Plan: General   Post-op Pain Management:    Induction:   PONV Risk Score and Plan: Propofol infusion  Airway Management Planned:   Additional Equipment:   Intra-op Plan:   Post-operative Plan:   Informed Consent: I have reviewed the patients History and Physical, chart, labs and discussed the procedure including the risks, benefits and alternatives for the proposed anesthesia with the patient or authorized representative who has indicated his/her understanding and acceptance.     Dental Advisory Given  Plan Discussed with: CRNA  Anesthesia Plan Comments:        Anesthesia Quick Evaluation

## 2023-11-26 NOTE — Transfer of Care (Signed)
Immediate Anesthesia Transfer of Care Note  Patient: Hannah Hays  Procedure(s) Performed: ESOPHAGOGASTRODUODENOSCOPY (EGD) WITH PROPOFOL BALLOON DILATION BIOPSY  Patient Location: PACU  Anesthesia Type:General  Level of Consciousness: awake, alert , and oriented  Airway & Oxygen Therapy: Patient Spontanous Breathing  Post-op Assessment: Report given to RN and Post -op Vital signs reviewed and stable  Post vital signs: Reviewed and stable  Last Vitals:  Vitals Value Taken Time  BP 91/44   Temp 98.1   Pulse 60   Resp 18   SpO2 99     Last Pain:  Vitals:   11/26/23 0928  TempSrc:   PainSc: 0-No pain      Patients Stated Pain Goal: 5 (11/26/23 0806)  Complications: No notable events documented.

## 2023-11-26 NOTE — Op Note (Signed)
[facility] Patient Name: Hannah Hays Procedure Date: 11/26/2023 9:18 AM MRN: [id number] Date of Birth: Aug 24, 1966 Attending MD: Hennie Duos. Marletta Lor , [place], [id number] CSN: [id number] Age: 57 Admit Type: Outpatient Procedure:                Upper GI endoscopy Indications:              Dysphagia, Heartburn Providers:                Hennie Duos. Marletta Lor, DO, Francoise Ceo RN, RN, Lennice Sites Technician, Technician Referring MD:              Medicines:                See the Anesthesia note for documentation of the                            administered medications Complications:            No immediate complications. Estimated Blood Loss:     Estimated blood loss was minimal. Procedure:                Pre-Anesthesia Assessment:                           - The anesthesia plan was to use monitored                            anesthesia care (MAC).                           After obtaining informed consent, the endoscope was                            passed under direct vision. Throughout the                            procedure, the patient's blood pressure, pulse, and                            oxygen saturations were monitored continuously. The                            GIF-H190 ([id number]) scope was introduced through the                            mouth, and advanced to the second part of duodenum.                            The upper GI endoscopy was accomplished without                            difficulty. The patient tolerated the procedure                            well. Scope In:  9:32:09 AM Scope Out: 9:36:02 AM Total Procedure Duration: 0 hours 3 minutes 53 seconds  Findings:      A web was found in the lower third of the esophagus. A TTS dilator was       passed through the scope. Dilation with an 18-19-20 mm balloon dilator       was performed to 20 mm. The dilation site was examined and showed       moderate improvement in luminal narrowing.       The Z-line was regular and was found 39 cm from the incisors.      Localized moderate inflammation characterized by erosions, erythema and       shallow ulcerations was found in the gastric antrum. Biopsies were taken       with a cold forceps for Helicobacter pylori testing.      The duodenal bulb, first portion of the duodenum and second portion of       the duodenum were normal. 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 Dilated.                           - Z-line regular, 39 cm from the incisors.                           - Gastritis. Biopsied.                           - Normal duodenal bulb, first portion of the                            duodenum and second portion of the duodenum. Moderate Sedation:      Per Anesthesia Care Recommendation:           - Patient has a contact number available for                            emergencies. The signs and symptoms of potential                            delayed complications were discussed with the                            patient. Return to normal activities tomorrow.                            Written discharge instructions were provided to the                            patient.                           - Resume previous diet.                           - Continue present medications.                           -  Await pathology results.                           - Repeat upper endoscopy PRN for retreatment.                           - Return to GI clinic in 3 months.                           - Use Protonix (pantoprazole) 40 mg PO BID.                           - No ibuprofen, naproxen, or other non-steroidal                            anti-inflammatory drugs. Procedure Code(s):        --- Professional ---                           [phone], Esophagogastroduodenoscopy, flexible,                            transoral; with transendoscopic balloon dilation of                            esophagus (less than 30  mm diameter)                           43239, 59, Esophagogastroduodenoscopy, flexible,                            transoral; with biopsy, single or multiple Diagnosis Code(s):        --- Professional ---                           Q39.4, Esophageal web                           K29.70, Gastritis, unspecified, without bleeding                           R13.10, Dysphagia, unspecified                           R12, Heartburn CPT copyright 2022 American Medical Association. All rights reserved. The codes documented in this report are preliminary and upon coder review may  be revised to meet current compliance requirements. Hennie Duos. Marletta Lor, DO Hennie Duos. Marletta Lor, DO 11/26/2023 9:39:32 AM This report has been signed electronically. Number of Addenda: 0

## 2023-11-26 NOTE — Discharge Instructions (Addendum)
EGD Discharge instructions Please read the instructions outlined below and refer to this sheet in the next few weeks. These discharge instructions provide you with general information on caring for yourself after you leave the hospital. Your doctor may also give you specific instructions. While your treatment has been planned according to the most current medical practices available, unavoidable complications occasionally occur. If you have any problems or questions after discharge, please call your doctor. ACTIVITY You may resume your regular activity but move at a slower pace for the next 24 hours.  Take frequent rest periods for the next 24 hours.  Walking will help expel (get rid of) the air and reduce the bloated feeling in your abdomen.  No driving for 24 hours (because of the anesthesia (medicine) used during the test).  You may shower.  Do not sign any important legal documents or operate any machinery for 24 hours (because of the anesthesia used during the test).  NUTRITION Drink plenty of fluids.  You may resume your normal diet.  Begin with a light meal and progress to your normal diet.  Avoid alcoholic beverages for 24 hours or as instructed by your caregiver.  MEDICATIONS You may resume your normal medications unless your caregiver tells you otherwise.  WHAT YOU CAN EXPECT TODAY You may experience abdominal discomfort such as a feeling of fullness or "gas" pains.  FOLLOW-UP Your doctor will discuss the results of your test with you.  SEEK IMMEDIATE MEDICAL ATTENTION IF ANY OF THE FOLLOWING OCCUR: Excessive nausea (feeling sick to your stomach) and/or vomiting.  Severe abdominal pain and distention (swelling).  Trouble swallowing.  Temperature over 101 F (37.8 C).  Rectal bleeding or vomiting of blood.    Your EGD revealed moderate amount inflammation in your stomach with multiple superficial ulcers.  I took biopsies of this to rule out infection with a bacteria called H.  pylori.  Await pathology results, my office will contact you.  Mild tightening noted in your esophagus.  Stretched this out today.  Hopefully this helps with feeling of food getting stuck.  Small bowel appeared normal.  I am going to change your esomeprazole to pantoprazole 40 mg twice daily.  Limit NSAID use as best as you can.  Follow-up in GI office in 3 months.   I hope you have a great rest of your week!  Hennie Duos. Marletta Lor, D.O. Gastroenterology and Hepatology [facility]

## 2023-11-27 ENCOUNTER — Other Ambulatory Visit: Payer: Self-pay | Admitting: Family Medicine

## 2023-11-27 ENCOUNTER — Telehealth: Payer: Self-pay | Admitting: Family Medicine

## 2023-11-27 LAB — SURGICAL PATHOLOGY

## 2023-11-27 MED ORDER — AMLODIPINE BESYLATE 10 MG PO TABS: 10.0000 mg | ORAL_TABLET | Freq: Every day | ORAL | 3 refills | Status: DC

## 2023-11-27 NOTE — Anesthesia Postprocedure Evaluation (Signed)
Anesthesia Post Note  Patient: Hannah Hays  Procedure(s) Performed: ESOPHAGOGASTRODUODENOSCOPY (EGD) WITH PROPOFOL BALLOON DILATION BIOPSY  Patient location during evaluation: Phase II Anesthesia Type: General Level of consciousness: awake Pain management: pain level controlled Vital Signs Assessment: post-procedure vital signs reviewed and stable Respiratory status: spontaneous breathing and respiratory function stable Cardiovascular status: blood pressure returned to baseline and stable Postop Assessment: no headache and no apparent nausea or vomiting Anesthetic complications: no Comments: Late entry   No notable events documented.   Last Vitals:  Vitals:   11/26/23 0806 11/26/23 0941  BP: (!) 115/55 (!) 91/50  Pulse: (!) 48 (!) 51  Resp: (!) 24 20  Temp: 36.8 C 36.5 C  SpO2: 99% 99%    Last Pain:  Vitals:   11/26/23 0941  TempSrc: Oral  PainSc: 0-No pain                 Windell Norfolk

## 2023-11-28 NOTE — Telephone Encounter (Signed)
Spoke directly with pt, rports low blood pressures, office visit set up to evaluate, she is to hold meds till visit

## 2023-11-29 ENCOUNTER — Encounter: Payer: Self-pay | Admitting: Family Medicine

## 2023-11-29 ENCOUNTER — Ambulatory Visit (INDEPENDENT_AMBULATORY_CARE_PROVIDER_SITE_OTHER): Payer: BC Managed Care – PPO | Admitting: Family Medicine

## 2023-11-29 VITALS — BP 124/72 | HR 66 | Ht 65.0 in | Wt 154.1 lb

## 2023-11-29 DIAGNOSIS — I10 Essential (primary) hypertension: Secondary | ICD-10-CM | POA: Diagnosis not present

## 2023-11-29 NOTE — Patient Instructions (Addendum)
Follow-up as before, call if you need me sooner.  Please get fasting labs for your next visit which are all already ordered.  Your blood pressure is excellent despite the fact that you have not been on amlodipine for the last 4 days.  Stop amlodipine.You do not need it at this time  The only medication you need to take for your blood pressure at this time is spironolactone  50 mg 1 daily.  I recommend you check your blood pressure 1 to 2 times per week and if it starts rising again please notify me , otherwise I will see you in February as scheduled.  Thanks for choosing [facility], we consider it a privelige to serve you.  Best for 2025!

## 2023-12-02 NOTE — Assessment & Plan Note (Signed)
Adequately controlled on azor ONLY , has been off anmlodipine for 4 days , and recent BP measured at hospital very low. DISCONTINUE amlodipine ,home BP checks twice weekly, cal if BP starts rising DASH diet and commitment to daily physical activity for a minimum of 30 minutes discussed and encouraged, as a part of hypertension management. The importance of attaining a healthy weight is also discussed.     11/29/2023    9:56 AM 11/29/2023    9:33 AM 11/26/2023    9:41 AM 11/26/2023    8:06 AM 11/22/2023   10:08 AM 10/05/2023   10:24 AM 10/04/2023   11:12 AM  BP/Weight  Systolic BP 124 126 91 115  120 136  Diastolic BP 72 76 50 55  72 72  Wt. (Lbs)  154.12   154.54 154.6 152.2  BMI  25.65 kg/m2   25.6 kg/m2 25.6 kg/m2 25.33 kg/m2

## 2023-12-02 NOTE — Progress Notes (Signed)
° °  Hannah Hays     MRN: [id number]      DOB: May 23, 1966  Chief Complaint  Patient presents with   Hypertension    BP CHECK     HPI Hannah Hays is here for evaluation of blood pressure, during recent  endoscopy it was noted to be very low No recent change in medication or lifestyle reported No significant light headedness, but concerned re low BP  ROS See HPI  Denies chest pains, palpitations and leg swelling  PE  BP 124/72   Pulse 66   Ht 5\' 5"  (1.651 m)   Wt 154 lb 1.9 oz (69.9 kg)   SpO2 96%   BMI 25.65 kg/m   Patient alert and oriented and in no cardiopulmonary distress.  HEENT: No facial asymmetry, EOMI,     Neck supple .  Chest: Clear to auscultation bilaterally.  CVS: S1, S2 no murmurs, no S3.Regular rate.No pedal edema  CNS: CN 2-12 intact, power,  normal throughout.no focal deficits noted.   Assessment & Plan  HTN (hypertension) Adequately controlled on azor ONLY , has been off anmlodipine for 4 days , and recent BP measured at hospital very low. DISCONTINUE amlodipine ,home BP checks twice weekly, cal if BP starts rising DASH diet and commitment to daily physical activity for a minimum of 30 minutes discussed and encouraged, as a part of hypertension management. The importance of attaining a healthy weight is also discussed.     11/29/2023    9:56 AM 11/29/2023    9:33 AM 11/26/2023    9:41 AM 11/26/2023    8:06 AM 11/22/2023   10:08 AM 10/05/2023   10:24 AM 10/04/2023   11:12 AM  BP/Weight  Systolic BP 124 126 91 115  120 136  Diastolic BP 72 76 50 55  72 72  Wt. (Lbs)  154.12   154.54 154.6 152.2  BMI  25.65 kg/m2   25.6 kg/m2 25.6 kg/m2 25.33 kg/m2

## 2023-12-24 ENCOUNTER — Ambulatory Visit (INDEPENDENT_AMBULATORY_CARE_PROVIDER_SITE_OTHER): Payer: BC Managed Care – PPO

## 2023-12-24 DIAGNOSIS — J4550 Severe persistent asthma, uncomplicated: Secondary | ICD-10-CM

## 2024-01-10 NOTE — Patient Instructions (Addendum)
 1. Moderate persistent asthma, uncomplicated You can contact Tammy, our biologics coordinator, at [phone] about questions you have concerning Tezspire  and your insurance - Daily controller medication(s): Breztri  two puffs twice daily with spacer + Singulair  10mg  daily -Continue tezspire  injections once a month for control of asthma symptoms - Rescue medications: albuterol  2 puffs every 4-6 hours as needed - Asthma control goals:  * Full participation in all desired activities (may need albuterol  before activity) * Albuterol  use two time or less a week on average (not counting use with activity) * Cough interfering with sleep two time or less a month * Oral steroids no more than once a year * No hospitalizations  2. Perennial allergic rhinitis  - Continue with Flonase  Sensimist two puffs once daily as needed for stuffy nose.   - Continue with nasal saline rinses 1-2 times daily.  - Continue with Singulair  10mg  daily. - Continue Zyrtec  10 mg and Xyzal  5mg  at night.  -Begin.  She is having some nasal saline gel as needed for dry nostrils  3.  Food allergy Continue to avoid pineapple.  In case of an allergic reaction, take Benadryl 50 mg every 4 hours, and if life-threatening symptoms occur, inject with EpiPen  0.3 mg.  4.  Epistaxis Pinch both nostrils while leaning forward for at least 5 minutes before checking to see if the bleeding has stopped. If bleeding is not controlled within 5-10 minutes apply a cotton ball soaked with oxymetazoline  (Afrin) to the bleeding nostril for a few seconds.  If the problem persists or worsens a referral to ENT for further evaluation may be necessary.   5.Dysphagia Continue to follow-up with your GI specialist.    6. Nasal polyposis Continue Flonase  Sensimist as tolerated We could consider changing your biologic to Dupixent for control of nasal polyposis.  Call the clinic if this treatment plan is not working well for you.  Follow up in 4-6 months  or sooner if needed.

## 2024-01-11 ENCOUNTER — Ambulatory Visit: Payer: BC Managed Care – PPO | Admitting: Family Medicine

## 2024-01-11 ENCOUNTER — Other Ambulatory Visit: Payer: Self-pay

## 2024-01-11 ENCOUNTER — Ambulatory Visit (INDEPENDENT_AMBULATORY_CARE_PROVIDER_SITE_OTHER): Payer: BC Managed Care – PPO | Admitting: Family

## 2024-01-11 ENCOUNTER — Encounter: Payer: Self-pay | Admitting: Family

## 2024-01-11 VITALS — BP 124/80 | HR 53 | Temp 98.2°F | Resp 14 | Wt 153.1 lb

## 2024-01-11 DIAGNOSIS — J339 Nasal polyp, unspecified: Secondary | ICD-10-CM

## 2024-01-11 DIAGNOSIS — J4550 Severe persistent asthma, uncomplicated: Secondary | ICD-10-CM | POA: Diagnosis not present

## 2024-01-11 DIAGNOSIS — J3089 Other allergic rhinitis: Secondary | ICD-10-CM

## 2024-01-11 DIAGNOSIS — T7800XD Anaphylactic reaction due to unspecified food, subsequent encounter: Secondary | ICD-10-CM | POA: Diagnosis not present

## 2024-01-11 DIAGNOSIS — R040 Epistaxis: Secondary | ICD-10-CM

## 2024-01-11 DIAGNOSIS — T7800XA Anaphylactic reaction due to unspecified food, initial encounter: Secondary | ICD-10-CM

## 2024-01-11 NOTE — Progress Notes (Signed)
 [address] [place] [address] Dept: [phone]  FOLLOW UP NOTE  Patient ID: Hannah Hays, female    DOB: 01-22-1966  Age: 58 y.o. MRN: [id number] Date of Office Visit: 01/11/2024  Assessment  Chief Complaint: Follow-up  HPI Hannah Hays is a 58 year old female who presents today for follow-up of severe persistent asthma without complication, perennial allergic rhinitis, allergy with anaphylaxis due to food, epistaxis, eosinophilic esophagitis, and nasal polyposis.  She was last seen on October 05, 2023 by Arlean Mutter, FNP.  She reports since her last office visit she had a colonoscopy that showed ulcers.  She was instructed to stop her Nexium .  Moderate persistent asthma: She continues to take Breztri  2 puffs twice a day with spacer, Singulair  10 mg daily, and Tezspire  injections every 4 weeks.  She mentions that she got a letter concerning her Tezspire .  She reports that she recently got over a dry cough that cleared up on its own.  During that time she did not have a fever or runny nose.  She reports that she will occasionally have wheezing that is been not too bad.  She denies tightness in chest, shortness of breath, and nocturnal awakenings due to breathing problems.  Since her last office visit she has not required any systemic steroids or made any trips to the emergency room or urgent care due to breathing problems.  She has used her albuterol  maybe 2 times in the past 2 to 3 months.  She denies any problems or reactions with her Tezspire  injections, but mentions that they do hurt.  When she was previously on Fasenra  they did not hurt.  Perennial allergic rhinitis: She reports a little bit of clear rhinorrhea and denies nasal congestion and postnasal drip.  She has not been treated for any sinus infections since we last saw her.  She uses Flonase  Sensimist as needed, saline rinses as needed, Singulair  10 mg daily, and Zyrtec  and Xyzal  as needed.  Food allergy: She  reports that she continues to try to avoid pineapple without any accidental ingestion since her last office visit.  She reports that she has not had to use her epinephrine  autoinjector device and that it is up to date.  Epistaxis: She reports that she has had 1 nosebleed that was not bad since her last office visit.  She reports that she blew her nose and saw some blood.  She reports that she continues to have dysphagia, but is not as bad.  Certain foods will bother her she feels like the food will go slowly down and it will hurt sometimes.  Did have an endoscopy on November 26, 2023 showing:   URGICAL PATHOLOGY SURGICAL PATHOLOGY CASE: [id number] PATIENT: Hannah Hays Surgical Pathology Report     Clinical History: Dysphagia, GERD (crm)     FINAL MICROSCOPIC DIAGNOSIS:  A. STOMACH, BIOPSY:      Gastric antral/oxyntic mucosa with no significant diagnostic alteration. No H.pylori identified on HE stain. Negative for intestinal metaplasia or dysplasia.   GROSS DESCRIPTION:  Received in formalin are tan, soft tissue fragments that are submitted in toto. Number: 4 size: Range from 0.3 to 0.6 cm blocks: 1 (KL 11/26/2023)    Nasal polyposis: She reports that she has not been able to smell in a long time unless it is a really strong scent can she smell it.  Her taste is 0K.  She previously had sinus surgery approximately 13 years ago.   Drug Allergies:  Allergies  Allergen Reactions   Pineapple Other (See Comments)    Mouth Swelling   Bacid Rash    Review of Systems: Negative except as per HPI   Physical Exam: BP 124/80   Pulse (!) 53   Temp 98.2 F (36.8 C)   Resp 14   Wt 153 lb 2 oz (69.5 kg)   SpO2 98%   BMI 25.48 kg/m    Physical Exam Constitutional:      Appearance: Normal appearance.  HENT:     Head: Normocephalic and atraumatic.     Comments: Pharynx normal, eyes normal, ears normal, nose: Bilateral lower turbinates moderately edematous and  slightly erythematous with no drainage noted    Right Ear: Tympanic membrane, ear canal and external ear normal.     Left Ear: Tympanic membrane, ear canal and external ear normal.     Mouth/Throat:     Mouth: Mucous membranes are moist.     Pharynx: Oropharynx is clear.  Eyes:     Conjunctiva/sclera: Conjunctivae normal.  Cardiovascular:     Rate and Rhythm: Regular rhythm.     Heart sounds: Normal heart sounds.  Pulmonary:     Effort: Pulmonary effort is normal.     Breath sounds: Normal breath sounds.     Comments: Lungs clear to auscultation Musculoskeletal:     Cervical back: Neck supple.  Skin:    General: Skin is warm.  Neurological:     Mental Status: She is alert and oriented to person, place, and time.  Psychiatric:        Mood and Affect: Mood normal.        Behavior: Behavior normal.        Thought Content: Thought content normal.        Judgment: Judgment normal.     Diagnostics: FVC 2.32 L (81%), FEV1 1.74 L (76%), FEV1/FVC 0.75.  Spirometry indicates normal spirometry.  Assessment and Plan: 1. Perennial allergic rhinitis   2. Severe persistent asthma without complication   3. Nasal polyposis   4. Allergy with anaphylaxis due to food   5. Epistaxis     No orders of the defined types were placed in this encounter.   Patient Instructions  1. Moderate persistent asthma, uncomplicated You can contact Tammy, our biologics coordinator, at [phone] about questions you have concerning Tezspire  and your insurance - Daily controller medication(s): Breztri  two puffs twice daily with spacer + Singulair  10mg  daily -Continue tezspire  injections once a month for control of asthma symptoms - Rescue medications: albuterol  2 puffs every 4-6 hours as needed - Asthma control goals:  * Full participation in all desired activities (may need albuterol  before activity) * Albuterol  use two time or less a week on average (not counting use with activity) * Cough interfering  with sleep two time or less a month * Oral steroids no more than once a year * No hospitalizations  2. Perennial allergic rhinitis  - Continue with Flonase  Sensimist two puffs once daily as needed for stuffy nose.   - Continue with nasal saline rinses 1-2 times daily.  - Continue with Singulair  10mg  daily. - Continue Zyrtec  10 mg and Xyzal  5mg  at night.  -Begin.  She is having some nasal saline gel as needed for dry nostrils  3.  Food allergy Continue to avoid pineapple.  In case of an allergic reaction, take Benadryl 50 mg every 4 hours, and if life-threatening symptoms occur, inject with EpiPen  0.3 mg.  4.  Epistaxis Pinch both nostrils  while leaning forward for at least 5 minutes before checking to see if the bleeding has stopped. If bleeding is not controlled within 5-10 minutes apply a cotton ball soaked with oxymetazoline  (Afrin) to the bleeding nostril for a few seconds.  If the problem persists or worsens a referral to ENT for further evaluation may be necessary.   5.Dysphagia Continue to follow-up with your GI specialist.    6. Nasal polyposis Continue Flonase  Sensimist as tolerated We could consider changing your biologic to Dupixent for control of nasal polyposis.  Call the clinic if this treatment plan is not working well for you.  Follow up in 4-6 months or sooner if needed.  Return in about 6 months (around 07/10/2024).    Thank you for the opportunity to care for this patient.  Please do not hesitate to contact me with questions.  Wanda Craze, FNP [facility] 

## 2024-01-21 ENCOUNTER — Ambulatory Visit: Payer: BC Managed Care – PPO

## 2024-01-28 ENCOUNTER — Ambulatory Visit (INDEPENDENT_AMBULATORY_CARE_PROVIDER_SITE_OTHER): Payer: BC Managed Care – PPO

## 2024-01-28 DIAGNOSIS — J4550 Severe persistent asthma, uncomplicated: Secondary | ICD-10-CM

## 2024-01-29 ENCOUNTER — Ambulatory Visit (INDEPENDENT_AMBULATORY_CARE_PROVIDER_SITE_OTHER): Payer: BC Managed Care – PPO | Admitting: Family Medicine

## 2024-01-29 ENCOUNTER — Encounter: Payer: Self-pay | Admitting: Family Medicine

## 2024-01-29 VITALS — BP 148/90 | HR 54 | Ht 65.0 in | Wt 154.1 lb

## 2024-01-29 DIAGNOSIS — G43919 Migraine, unspecified, intractable, without status migrainosus: Secondary | ICD-10-CM

## 2024-01-29 DIAGNOSIS — J4551 Severe persistent asthma with (acute) exacerbation: Secondary | ICD-10-CM

## 2024-01-29 DIAGNOSIS — I10 Essential (primary) hypertension: Secondary | ICD-10-CM | POA: Diagnosis not present

## 2024-01-29 DIAGNOSIS — E559 Vitamin D deficiency, unspecified: Secondary | ICD-10-CM | POA: Diagnosis not present

## 2024-01-29 DIAGNOSIS — Z23 Encounter for immunization: Secondary | ICD-10-CM

## 2024-01-29 DIAGNOSIS — Z1231 Encounter for screening mammogram for malignant neoplasm of breast: Secondary | ICD-10-CM

## 2024-01-29 DIAGNOSIS — R7302 Impaired glucose tolerance (oral): Secondary | ICD-10-CM

## 2024-01-29 DIAGNOSIS — E785 Hyperlipidemia, unspecified: Secondary | ICD-10-CM

## 2024-01-29 DIAGNOSIS — E663 Overweight: Secondary | ICD-10-CM | POA: Diagnosis not present

## 2024-01-29 NOTE — Patient Instructions (Addendum)
 F/u with MD to  re evaluate blood pressure in 7 to 9 weeks, call if you need me sooner  Please schedule  Nurse BP check in 4 weeks.   If blood pressure is 140/85 or more I recommend increasing spironolactone to one and a half tablets once daily  NEED TO SLOW DOWN and get more rest  Start bene fiber twice daily to help to get stool more firm  Labs today, result will be sent to you  Please schedule mammogram at checkout.  Thanks for choosing [facility], we consider it a privelige to serve you.

## 2024-01-30 ENCOUNTER — Encounter: Payer: Self-pay | Admitting: Family Medicine

## 2024-01-30 LAB — CMP14+EGFR
ALT: 48 [IU]/L — ABNORMAL HIGH (ref 0–32)
AST: 36 [IU]/L (ref 0–40)
Albumin: 4.7 g/dL (ref 3.8–4.9)
Alkaline Phosphatase: 92 [IU]/L (ref 44–121)
BUN/Creatinine Ratio: 11 (ref 9–23)
BUN: 9 mg/dL (ref 6–24)
Bilirubin Total: 0.5 mg/dL (ref 0.0–1.2)
CO2: 24 mmol/L (ref 20–29)
Calcium: 9.6 mg/dL (ref 8.7–10.2)
Chloride: 104 mmol/L (ref 96–106)
Creatinine, Ser: 0.83 mg/dL (ref 0.57–1.00)
Globulin, Total: 2.5 g/dL (ref 1.5–4.5)
Glucose: 98 mg/dL (ref 70–99)
Potassium: 4.3 mmol/L (ref 3.5–5.2)
Sodium: 142 mmol/L (ref 134–144)
Total Protein: 7.2 g/dL (ref 6.0–8.5)
eGFR: 82 mL/min/{1.73_m2} (ref >59)

## 2024-01-30 LAB — LIPID PANEL
Chol/HDL Ratio: 2.4 {ratio} (ref 0.0–4.4)
Cholesterol, Total: 112 mg/dL (ref 100–199)
HDL: 47 mg/dL (ref >39)
LDL Chol Calc (NIH): 51 mg/dL (ref 0–99)
Triglycerides: 64 mg/dL (ref 0–149)
VLDL Cholesterol Cal: 14 mg/dL (ref 5–40)

## 2024-01-30 LAB — HEMOGLOBIN A1C
Est. average glucose Bld gHb Est-mCnc: 146 mg/dL
Hgb A1c MFr Bld: 6.7 % — ABNORMAL HIGH (ref 4.8–5.6)

## 2024-01-30 LAB — VITAMIN D 25 HYDROXY (VIT D DEFICIENCY, FRACTURES): Vit D, 25-Hydroxy: 39.5 ng/mL (ref 30.0–100.0)

## 2024-02-04 NOTE — Assessment & Plan Note (Signed)
 DASH diet and commitment to daily physical activity for a minimum of 30 minutes discussed and encouraged, as a part of hypertension management. The importance of attaining a healthy weight is also discussed.     01/29/2024    9:34 AM 01/29/2024    9:26 AM 01/29/2024    9:11 AM 01/29/2024    8:36 AM 01/11/2024    9:01 AM 11/29/2023    9:56 AM 11/29/2023    9:33 AM  BP/Weight  Systolic BP 148 160 159 162 124 124 126  Diastolic BP 90 88 87 82 80 72 76  Wt. (Lbs)    154.12 153.13  154.12  BMI    25.65 kg/m2 25.48 kg/m2  25.65 kg/m2     Uncontrolled, nurse BP check in 4 weeks, MD follow up in 7 weeks, will increase spironolactone to 1.5 tabs if needed

## 2024-02-04 NOTE — Assessment & Plan Note (Signed)
 Patient educated about the importance of limiting  Carbohydrate intake , the need to commit to daily physical activity for a minimum of 30 minutes , and to commit weight loss. The fact that changes in all these areas will reduce or eliminate all together the development of diabetes is stressed.  Worsened , may be transitioning to diabetes , will repeat in 12 to 16 weeks, pt aware and will be more consistent with food choice     Latest Ref Rng & Units 01/29/2024   10:01 AM 08/07/2023    9:47 AM 01/26/2023   10:34 AM 07/26/2022    8:42 AM 01/26/2022    8:44 AM  Diabetic Labs  HbA1c 4.8 - 5.6 % 6.7  6.2  6.1  6.1  6.3   Chol 100 - 199 mg/dL 086  578  469   629   HDL >39 mg/dL 47  49  51   48   Calc LDL 0 - 99 mg/dL 51  528  413   87   Triglycerides 0 - 149 mg/dL 64  244  010   272   Creatinine 0.57 - 1.00 mg/dL 5.36  6.44  0.34  7.42  0.87       01/29/2024    9:34 AM 01/29/2024    9:26 AM 01/29/2024    9:11 AM 01/29/2024    8:36 AM 01/11/2024    9:01 AM 11/29/2023    9:56 AM 11/29/2023    9:33 AM  BP/Weight  Systolic BP 148 160 159 162 124 124 126  Diastolic BP 90 88 87 82 80 72 76  Wt. (Lbs)    154.12 153.13  154.12  BMI    25.65 kg/m2 25.48 kg/m2  25.65 kg/m2       No data to display

## 2024-02-04 NOTE — Assessment & Plan Note (Signed)
 Controlled, no change in medication

## 2024-02-04 NOTE — Assessment & Plan Note (Signed)
 Hyperlipidemia:Low fat diet discussed and encouraged.   Lipid Panel  Lab Results  Component Value Date   CHOL 112 01/29/2024   HDL 47 01/29/2024   LDLCALC 51 01/29/2024   TRIG 64 01/29/2024   CHOLHDL 2.4 01/29/2024     Controlled, no change in medication

## 2024-02-04 NOTE — Assessment & Plan Note (Signed)
 Pneumonia 20 administered

## 2024-02-04 NOTE — Progress Notes (Signed)
 Hannah Hays     MRN: [id number]      DOB: 04-11-1966  Chief Complaint  Patient presents with   Follow-up    HTN    HPI Hannah Hays is here for follow up and re-evaluation of chronic medical conditions, medication management and review of any available recent lab and radiology data.  Preventive health is updated, specifically  Cancer screening and Immunization.   Questions or concerns regarding consultations or procedures which the PT has had in the interim are  addressed. The PT denies any adverse reactions to current medications since the last visit.  Still reporting insufficient sleep/ rest  as she keeps busy  with home responsibilities and full time work  ROS Denies recent fever or chills. Denies sinus pressure, nasal congestion, ear pain or sore throat. Denies chest congestion, productive cough or wheezing. Denies chest pains, palpitations and leg swelling Denies abdominal pain, nausea, vomiting,diarrhea or constipation.   Denies dysuria, frequency, hesitancy or incontinence. Denies joint pain, swelling and limitation in mobility. Denies headaches, seizures, numbness, or tingling. Denies depression, anxiety or insomnia. Denies skin break down or rash.   PE  BP (!) 148/90   Pulse (!) 54   Ht 5\' 5"  (1.651 m)   Wt 154 lb 1.9 oz (69.9 kg)   SpO2 99%   BMI 25.65 kg/m   Patient alert and oriented and in no cardiopulmonary distress.  HEENT: No facial asymmetry, EOMI,     Neck supple .  Chest: Clear to auscultation bilaterally.  CVS: S1, S2 no murmurs, no S3.Regular rate.  ABD: Soft non tender.   Ext: No edema  MS: Adequate ROM spine, shoulders, hips and knees.  Skin: Intact, no ulcerations or rash noted.  Psych: Good eye contact, normal affect. Memory intact not anxious or depressed appearing.  CNS: CN 2-12 intact, power,  normal throughout.no focal deficits noted.   Assessment & Plan  HTN (hypertension) DASH diet and commitment to daily physical  activity for a minimum of 30 minutes discussed and encouraged, as a part of hypertension management. The importance of attaining a healthy weight is also discussed.     01/29/2024    9:34 AM 01/29/2024    9:26 AM 01/29/2024    9:11 AM 01/29/2024    8:36 AM 01/11/2024    9:01 AM 11/29/2023    9:56 AM 11/29/2023    9:33 AM  BP/Weight  Systolic BP 148 160 159 162 124 124 126  Diastolic BP 90 88 87 82 80 72 76  Wt. (Lbs)    154.12 153.13  154.12  BMI    25.65 kg/m2 25.48 kg/m2  25.65 kg/m2     Uncontrolled, nurse BP check in 4 weeks, MD follow up in 7 weeks, will increase spironolactone to 1.5 tabs if needed  Hyperlipidemia LDL goal <100 Hyperlipidemia:Low fat diet discussed and encouraged.   Lipid Panel  Lab Results  Component Value Date   CHOL 112 01/29/2024   HDL 47 01/29/2024   LDLCALC 51 01/29/2024   TRIG 64 01/29/2024   CHOLHDL 2.4 01/29/2024     Controlled, no change in medication   Asthma Mannaged by allergy and controlled  Migraine Controlled, no change in medication   IGT (impaired glucose tolerance) Patient educated about the importance of limiting  Carbohydrate intake , the need to commit to daily physical activity for a minimum of 30 minutes , and to commit weight loss. The fact that changes in all these areas will reduce or eliminate  all together the development of diabetes is stressed.  Worsened , may be transitioning to diabetes , will repeat in 12 to 16 weeks, pt aware and will be more consistent with food choice     Latest Ref Rng & Units 01/29/2024   10:01 AM 08/07/2023    9:47 AM 01/26/2023   10:34 AM 07/26/2022    8:42 AM 01/26/2022    8:44 AM  Diabetic Labs  HbA1c 4.8 - 5.6 % 6.7  6.2  6.1  6.1  6.3   Chol 100 - 199 mg/dL 161  096  045   409   HDL >39 mg/dL 47  49  51   48   Calc LDL 0 - 99 mg/dL 51  811  914   87   Triglycerides 0 - 149 mg/dL 64  782  956   213   Creatinine 0.57 - 1.00 mg/dL 0.86  5.78  4.69  6.29  0.87       01/29/2024     9:34 AM 01/29/2024    9:26 AM 01/29/2024    9:11 AM 01/29/2024    8:36 AM 01/11/2024    9:01 AM 11/29/2023    9:56 AM 11/29/2023    9:33 AM  BP/Weight  Systolic BP 148 160 159 162 124 124 126  Diastolic BP 90 88 87 82 80 72 76  Wt. (Lbs)    154.12 153.13  154.12  BMI    25.65 kg/m2 25.48 kg/m2  25.65 kg/m2       No data to display            Encounter for immunization Pneumonia 20 administered

## 2024-02-04 NOTE — Assessment & Plan Note (Signed)
 Mannaged by allergy and controlled

## 2024-02-07 ENCOUNTER — Encounter: Payer: Self-pay | Admitting: Internal Medicine

## 2024-02-25 ENCOUNTER — Ambulatory Visit: Payer: BC Managed Care – PPO

## 2024-02-26 ENCOUNTER — Ambulatory Visit: Payer: BC Managed Care – PPO

## 2024-02-26 VITALS — BP 133/73 | HR 63 | Ht 65.0 in | Wt 156.0 lb

## 2024-02-26 DIAGNOSIS — I10 Essential (primary) hypertension: Secondary | ICD-10-CM

## 2024-02-26 NOTE — Progress Notes (Signed)
 Pt. Was seen in office to day for B/P recheck at providers request.   Pt. Was alert and in good mood, balance and attitude. Pt. Stated that she was not feeling any pain and that she was feeling well today .   Vitals were taken at good measurements.   Pt stated she has been seeing the same reading of B/P as today's readings at home. She did not bring her chart with her.   MA requested that if possible for the pt to kindly attach her log to a My chart message for providers review.   Pt. Stated that she understood and would add later today .   Vitals recorded in this encounter .

## 2024-02-27 ENCOUNTER — Ambulatory Visit

## 2024-02-27 DIAGNOSIS — J4550 Severe persistent asthma, uncomplicated: Secondary | ICD-10-CM

## 2024-03-07 ENCOUNTER — Telehealth: Payer: Self-pay | Admitting: Family Medicine

## 2024-03-07 NOTE — Telephone Encounter (Signed)
 Handicap Placard Noted  Copied Scanned Original in provider box Copy at front desk

## 2024-03-12 NOTE — Telephone Encounter (Signed)
 Form picked up

## 2024-03-24 ENCOUNTER — Ambulatory Visit

## 2024-03-24 DIAGNOSIS — J4550 Severe persistent asthma, uncomplicated: Secondary | ICD-10-CM

## 2024-03-30 ENCOUNTER — Other Ambulatory Visit: Payer: Self-pay | Admitting: Family Medicine

## 2024-04-03 ENCOUNTER — Ambulatory Visit: Payer: Self-pay | Admitting: Family Medicine

## 2024-04-14 ENCOUNTER — Other Ambulatory Visit: Payer: Self-pay | Admitting: Allergy & Immunology

## 2024-04-14 ENCOUNTER — Other Ambulatory Visit: Payer: Self-pay | Admitting: Family Medicine

## 2024-04-15 ENCOUNTER — Other Ambulatory Visit: Payer: Self-pay | Admitting: Orthopedic Surgery

## 2024-04-15 ENCOUNTER — Other Ambulatory Visit: Payer: Self-pay | Admitting: Family

## 2024-04-21 ENCOUNTER — Ambulatory Visit

## 2024-04-21 DIAGNOSIS — J4550 Severe persistent asthma, uncomplicated: Secondary | ICD-10-CM | POA: Diagnosis not present

## 2024-05-16 ENCOUNTER — Ambulatory Visit (INDEPENDENT_AMBULATORY_CARE_PROVIDER_SITE_OTHER): Payer: Self-pay | Admitting: Family Medicine

## 2024-05-16 ENCOUNTER — Encounter: Payer: Self-pay | Admitting: Family Medicine

## 2024-05-16 VITALS — BP 120/80 | HR 56 | Resp 16 | Ht 65.0 in | Wt 150.1 lb

## 2024-05-16 DIAGNOSIS — K219 Gastro-esophageal reflux disease without esophagitis: Secondary | ICD-10-CM | POA: Diagnosis not present

## 2024-05-16 DIAGNOSIS — J4551 Severe persistent asthma with (acute) exacerbation: Secondary | ICD-10-CM

## 2024-05-16 DIAGNOSIS — I10 Essential (primary) hypertension: Secondary | ICD-10-CM

## 2024-05-16 DIAGNOSIS — J3089 Other allergic rhinitis: Secondary | ICD-10-CM

## 2024-05-16 DIAGNOSIS — R7302 Impaired glucose tolerance (oral): Secondary | ICD-10-CM

## 2024-05-16 DIAGNOSIS — E785 Hyperlipidemia, unspecified: Secondary | ICD-10-CM | POA: Diagnosis not present

## 2024-05-16 NOTE — Assessment & Plan Note (Signed)
 Controlled, no change in medication

## 2024-05-16 NOTE — Patient Instructions (Signed)
 F/u in 18 weeks, call if you need me sooner  Blood pressure is excellent, keep up the good work  Labs today, CBC, HBA1C, TSH , bmp and eGFr today  Fasting lipid, cmp and eGFR and hBA1c 3 to 5 days before next visit  It is important that you exercise regularly at least 30 minutes 5 times a week. If you develop chest pain, have severe difficulty breathing, or feel very tired, stop exercising immediately and seek medical attention   .Thanks for choosing [facility], we consider it a privelige to serve you.

## 2024-05-16 NOTE — Assessment & Plan Note (Signed)
 On immunotherapy and well controlled

## 2024-05-16 NOTE — Progress Notes (Signed)
 Hannah Hays     MRN: [id number]      DOB: [birth year]  Chief Complaint  Patient presents with   Hypertension    7 week follow up     HPI Hannah Hays is here for follow up and re-evaluation of chronic medical conditions, medication management and review of any available recent lab and radiology data.  Preventive health is updated, specifically  Cancer screening and Immunization.   Questions or concerns regarding consultations or procedures which the PT has had in the interim are  addressed. The PT denies any adverse reactions to current medications since the last visit.  There are no new concerns.  There are no specific complaints   ROS Denies recent fever or chills. Denies sinus pressure, nasal congestion, ear pain or sore throat. Denies chest congestion, productive cough or wheezing. Denies chest pains, palpitations and leg swelling Denies abdominal pain, nausea, vomiting,diarrhea or constipation.   Denies dysuria, frequency, hesitancy or incontinence. Denies joint pain, swelling and limitation in mobility. Denies headaches, seizures, numbness, or tingling. Denies depression, anxiety or insomnia. Denies skin break down or rash.   PE  BP 120/80   Pulse (!) 56   Resp 16   Ht 5' 5 (1.651 m)   Wt 150 lb 1 oz (68.1 kg)   SpO2 97%   BMI 24.97 kg/m   Patient alert and oriented and in no cardiopulmonary distress.  HEENT: No facial asymmetry, EOMI,     Neck supple .  Chest: Clear to auscultation bilaterally.  CVS: S1, S2 no murmurs, no S3.Regular rate.  ABD: Soft non tender.   Ext: No edema  MS: Adequate ROM spine, shoulders, hips and knees.  Skin: Intact, no ulcerations or rash noted.  Psych: Good eye contact, normal affect. Memory intact not anxious or depressed appearing.  CNS: CN 2-12 intact, power,  normal throughout.no focal deficits noted.   Assessment & Plan  HTN (hypertension) Controlled, no change in medication DASH diet and commitment to daily  physical activity for a minimum of 30 minutes discussed and encouraged, as a part of hypertension management. The importance of attaining a healthy weight is also discussed.     05/16/2024    8:59 AM 05/16/2024    8:51 AM 05/16/2024    8:43 AM 02/26/2024    9:19 AM 01/29/2024    9:34 AM 01/29/2024    9:26 AM 01/29/2024    9:11 AM  BP/Weight  Systolic BP 120 137 147 133 148 160 159  Diastolic BP 80 80 76 73 90 88 87  Wt. (Lbs)   150.06 156.04     BMI   24.97 kg/m2 25.97 kg/m2          Hyperlipidemia LDL goal <100 Hyperlipidemia:Low fat diet discussed and encouraged.   Lipid Panel  Lab Results  Component Value Date   CHOL 112 01/29/2024   HDL 47 01/29/2024   LDLCALC 51 01/29/2024   TRIG 64 01/29/2024   CHOLHDL 2.4 01/29/2024     Controlled, no change in medication   IGT (impaired glucose tolerance) Patient educated about the importance of limiting  Carbohydrate intake , the need to commit to daily physical activity for a minimum of 30 minutes , and to commit weight loss. The fact that changes in all these areas will reduce or eliminate all together the development of diabetes is stressed.      Latest Ref Rng & Units 01/29/2024   10:01 AM 08/07/2023    9:47 AM 01/26/2023  10:34 AM 07/26/2022    8:42 AM 01/26/2022    8:44 AM  Diabetic Labs  HbA1c 4.8 - 5.6 % 6.7  6.2  6.1  6.1  6.3   Chol 100 - 199 mg/dL 161  096  045   409   HDL >39 mg/dL 47  49  51   48   Calc LDL 0 - 99 mg/dL 51  811  914   87   Triglycerides 0 - 149 mg/dL 64  782  956   213   Creatinine 0.57 - 1.00 mg/dL 0.86  5.78  4.69  6.29  0.87       05/16/2024    8:59 AM 05/16/2024    8:51 AM 05/16/2024    8:43 AM 02/26/2024    9:19 AM 01/29/2024    9:34 AM 01/29/2024    9:26 AM 01/29/2024    9:11 AM  BP/Weight  Systolic BP 120 137 147 133 148 160 159  Diastolic BP 80 80 76 73 90 88 87  Wt. (Lbs)   150.06 156.04     BMI   24.97 kg/m2 25.97 kg/m2          No data to display          Updated lab  needed  GERD (gastroesophageal reflux disease) Controlled, no change in medication   Asthma Controlled, no change in medication   Perennial allergic rhinitis On immunotherapy and well controlled

## 2024-05-16 NOTE — Assessment & Plan Note (Signed)
 Controlled, no change in medication DASH diet and commitment to daily physical activity for a minimum of 30 minutes discussed and encouraged, as a part of hypertension management. The importance of attaining a healthy weight is also discussed.     05/16/2024    8:59 AM 05/16/2024    8:51 AM 05/16/2024    8:43 AM 02/26/2024    9:19 AM 01/29/2024    9:34 AM 01/29/2024    9:26 AM 01/29/2024    9:11 AM  BP/Weight  Systolic BP 120 137 147 133 148 160 159  Diastolic BP 80 80 76 73 90 88 87  Wt. (Lbs)   150.06 156.04     BMI   24.97 kg/m2 25.97 kg/m2

## 2024-05-16 NOTE — Assessment & Plan Note (Signed)
 Hyperlipidemia:Low fat diet discussed and encouraged.   Lipid Panel  Lab Results  Component Value Date   CHOL 112 01/29/2024   HDL 47 01/29/2024   LDLCALC 51 01/29/2024   TRIG 64 01/29/2024   CHOLHDL 2.4 01/29/2024     Controlled, no change in medication

## 2024-05-16 NOTE — Assessment & Plan Note (Signed)
 Patient educated about the importance of limiting  Carbohydrate intake , the need to commit to daily physical activity for a minimum of 30 minutes , and to commit weight loss. The fact that changes in all these areas will reduce or eliminate all together the development of diabetes is stressed.      Latest Ref Rng & Units 01/29/2024   10:01 AM 08/07/2023    9:47 AM 01/26/2023   10:34 AM 07/26/2022    8:42 AM 01/26/2022    8:44 AM  Diabetic Labs  HbA1c 4.8 - 5.6 % 6.7  6.2  6.1  6.1  6.3   Chol 100 - 199 mg/dL 161  096  045   409   HDL >39 mg/dL 47  49  51   48   Calc LDL 0 - 99 mg/dL 51  811  914   87   Triglycerides 0 - 149 mg/dL 64  782  956   213   Creatinine 0.57 - 1.00 mg/dL 0.86  5.78  4.69  6.29  0.87       05/16/2024    8:59 AM 05/16/2024    8:51 AM 05/16/2024    8:43 AM 02/26/2024    9:19 AM 01/29/2024    9:34 AM 01/29/2024    9:26 AM 01/29/2024    9:11 AM  BP/Weight  Systolic BP 120 137 147 133 148 160 159  Diastolic BP 80 80 76 73 90 88 87  Wt. (Lbs)   150.06 156.04     BMI   24.97 kg/m2 25.97 kg/m2          No data to display          Updated lab needed

## 2024-05-17 LAB — CBC WITH DIFFERENTIAL/PLATELET
Basophils Absolute: 0 10*3/uL (ref 0.0–0.2)
Basos: 0 %
EOS (ABSOLUTE): 0.1 10*3/uL (ref 0.0–0.4)
Eos: 1 %
Hematocrit: 39 % (ref 34.0–46.6)
Hemoglobin: 12.8 g/dL (ref 11.1–15.9)
Immature Grans (Abs): 0 10*3/uL (ref 0.0–0.1)
Immature Granulocytes: 0 %
Lymphocytes Absolute: 2.2 10*3/uL (ref 0.7–3.1)
Lymphs: 40 %
MCH: 28.9 pg (ref 26.6–33.0)
MCHC: 32.8 g/dL (ref 31.5–35.7)
MCV: 88 fL (ref 79–97)
Monocytes Absolute: 0.5 10*3/uL (ref 0.1–0.9)
Monocytes: 9 %
Neutrophils Absolute: 2.7 10*3/uL (ref 1.4–7.0)
Neutrophils: 50 %
Platelets: 150 10*3/uL (ref 150–450)
RBC: 4.43 x10E6/uL (ref 3.77–5.28)
RDW: 14.7 % (ref 11.7–15.4)
WBC: 5.4 10*3/uL (ref 3.4–10.8)

## 2024-05-17 LAB — BMP8+EGFR
BUN/Creatinine Ratio: 15 (ref 9–23)
BUN: 14 mg/dL (ref 6–24)
CO2: 23 mmol/L (ref 20–29)
Calcium: 9.5 mg/dL (ref 8.7–10.2)
Chloride: 100 mmol/L (ref 96–106)
Creatinine, Ser: 0.95 mg/dL (ref 0.57–1.00)
Glucose: 89 mg/dL (ref 70–99)
Potassium: 4.2 mmol/L (ref 3.5–5.2)
Sodium: 138 mmol/L (ref 134–144)
eGFR: 69 mL/min/{1.73_m2} (ref >59)

## 2024-05-17 LAB — HEMOGLOBIN A1C
Est. average glucose Bld gHb Est-mCnc: 131 mg/dL
Hgb A1c MFr Bld: 6.2 % — ABNORMAL HIGH (ref 4.8–5.6)

## 2024-05-17 LAB — TSH: TSH: 1.46 u[IU]/mL (ref 0.450–4.500)

## 2024-05-19 ENCOUNTER — Ambulatory Visit

## 2024-05-19 ENCOUNTER — Ambulatory Visit: Payer: Self-pay | Admitting: Family Medicine

## 2024-05-21 ENCOUNTER — Ambulatory Visit (INDEPENDENT_AMBULATORY_CARE_PROVIDER_SITE_OTHER)

## 2024-05-21 DIAGNOSIS — J4550 Severe persistent asthma, uncomplicated: Secondary | ICD-10-CM | POA: Diagnosis not present

## 2024-06-16 ENCOUNTER — Ambulatory Visit

## 2024-06-16 DIAGNOSIS — J4550 Severe persistent asthma, uncomplicated: Secondary | ICD-10-CM | POA: Diagnosis not present

## 2024-06-20 ENCOUNTER — Ambulatory Visit: Payer: Self-pay

## 2024-06-20 ENCOUNTER — Ambulatory Visit: Admission: EM | Admit: 2024-06-20 | Discharge: 2024-06-20 | Disposition: A | Discharge status: Home / Self Care

## 2024-06-20 DIAGNOSIS — L02219 Cutaneous abscess of trunk, unspecified: Secondary | ICD-10-CM | POA: Diagnosis not present

## 2024-06-20 DIAGNOSIS — L089 Local infection of the skin and subcutaneous tissue, unspecified: Secondary | ICD-10-CM

## 2024-06-20 DIAGNOSIS — B9689 Other specified bacterial agents as the cause of diseases classified elsewhere: Secondary | ICD-10-CM

## 2024-06-20 MED ORDER — DOXYCYCLINE HYCLATE 100 MG PO TABS: 100.0000 mg | ORAL_TABLET | Freq: Two times a day (BID) | ORAL | 0 refills | Status: AC

## 2024-06-20 NOTE — Telephone Encounter (Signed)
 FYI Only or Action Required?: FYI only for provider.  Patient was last seen in primary care on 05/16/2024 by Antonetta Rollene BRAVO, MD.  Called Nurse Triage reporting [place].  Symptoms began several years ago.  Interventions attempted: Nothing.  Symptoms are: gradually worsening.  Triage Disposition: See Physician Within 24 Hours  Patient/caregiver understands and will follow disposition?: Yes  Pt states she has had area to R upper back for several years and now gotten worse, painful and size of 50 cent piece. No appts with PCP until 07/02/24, scheduled UC appt today.   Copied from CRM [id number]. Topic: Clinical - Red Word Triage >> Jun 20, 2024  2:21 PM Montie POUR wrote: Red Word that prompted transfer to Nurse Triage:  She has a place on her right side back at the top. It has gotten worse. It is red, swollen, hurts to touch. No fever Reason for Disposition  [1] Swelling is painful to touch AND [2] no fever  Answer Assessment - Initial Assessment Questions 1. APPEARANCE of SWELLING: What does it look like?     Bump  2. SIZE: How large is the swelling? (e.g., inches, cm; or compare to size of pinhead, tip of pen, eraser, coin, pea, grape, ping pong ball)      50 cent piece  3. LOCATION: Where is the swelling located?     R upper back  4. ONSET: When did the swelling start?     Ongoing 3 years  5. COLOR: What color is it? Is there more than one color?     Redness  6. PAIN: Is there any pain? If Yes, ask: How bad is the pain? (Scale 1-10; or mild, moderate, severe)       Moderate  7. ITCH: Does it itch? If Yes, ask: How bad is the itch?      At times  9 OTHER SYMPTOMS: Do you have any other symptoms? (e.g., fever)     Red swollen painful  Protocols used: Skin Lump or Localized Swelling-A-AH

## 2024-06-20 NOTE — ED Provider Notes (Signed)
 [facility]    CSN: [id number] Arrival date & time: 06/20/24  1652      History   Chief Complaint Chief Complaint  Patient presents with   Abscess    HPI Hannah Hays is a 58 y.o. female.   The history is provided by the patient.   The patient presents for complaints of a possible abscess to the right upper back. Patient states the area has been present for the past 4 years, states over the past several days the area has gotten larger, painful and tender to touch. The patient states she has pain when she tries to lay on her back. States the area did drain, but now it looks like it has a head on it.  Denies fever, chills, chest pain, abdominal pain, nausea, vomiting or purulent drainage from the site.  Past Medical History:  Diagnosis Date   Allergic rhinitis, seasonal    Anxiety    Asthma    Depression    GERD (gastroesophageal reflux disease)    Heart murmur    Was told this when i was a teenager. No problems so far   Helicobacter pylori gastritis 12/05/2007   s/p Prevpac treatment   History of colonoscopy 12/04/2006   with simple adenoma   Hypertension    IBS (irritable bowel syndrome)    pre-dominant diarrhea   Migraine headache 17   recurrence 1st time in 10 year in 2011    Patient Active Problem List   Diagnosis Date Noted   Allergy with anaphylaxis due to food 10/05/2023   Epistaxis 10/05/2023   Eosinophilic esophagitis 10/05/2023   Encounter for immunization 10/01/2023   Esophageal dysphagia 10/01/2023   Chronic pain of left elbow 09/07/2022   Posterior right knee pain 12/16/2020   Overweight (BMI 25.0-29.9) 06/30/2020   Insomnia 08/07/2018   Hyperlipidemia LDL goal <100 10/16/2017   Perennial allergic rhinitis 08/28/2017   Moderate persistent asthma, uncomplicated 08/28/2017   Hx of adenomatous colonic polyps    Lipoma 12/26/2015   Liver mass, left lobe 02/11/2015   Nasal polyposis 08/20/2014   Chronic nonallergic rhinitis  08/20/2014   Vitamin D  deficiency 07/12/2012   HTN (hypertension) 08/19/2011   GERD (gastroesophageal reflux disease) 04/11/2011   IBS 12/19/2010   Migraine 01/19/2010   IGT (impaired glucose tolerance) 12/26/2009   Asthma 11/15/2007    Past Surgical History:  Procedure Laterality Date   ABDOMINAL HYSTERECTOMY     CHOLECYSTECTOMY     COLONOSCOPY  11/13/2007   SLF:A 4 mm sessile transverse colon polyp/Random biopsies obtained throughout the colon/Otherwise normal. Negative for microscopic colitis, simple adenoma, due for surveillance 11/2017 per [facility]   COLONOSCOPY N/A 02/02/2016   Procedure: COLONOSCOPY;  Surgeon: Margo LITTIE Haddock, MD;  Location: [facility];  Service: Endoscopy;  Laterality: N/A;  12:15 PM   COLONOSCOPY WITH PROPOFOL  N/A 06/14/2021   Procedure: COLONOSCOPY WITH PROPOFOL ;  Surgeon: [name] Carlin POUR, DO;  Location: [facility];  Service: Endoscopy;  Laterality: N/A;  ASA II / 12:45   ESOPHAGOGASTRODUODENOSCOPY  03/20/2008   SLF:A 1- to 2-cm hiatal hernia, mild erythema in the antrum/Normal esophagus without evidence of Barrett's, +H.pylori s/p Prevapc treatment    ESOPHAGOGASTRODUODENOSCOPY (EGD) WITH ESOPHAGEAL DILATION N/A 02/14/2013   DOQ:IPDUJO Esophageal web/MILD Non-erosive gastritis (inflammation)   PARTIAL HYSTERECTOMY  2002   SEPTOPLASTY N/A 08/16/2015   Procedure: SEPTOPLASTY;  Surgeon: Daniel Moccasin, MD;  Location: [facility];  Service: ENT;  Laterality: N/A;   SINUS ENDO  WITH FUSION Bilateral 08/16/2015   Procedure: ETHMOIDECTOMY, SPHENOIDECTOMY, MAXILLARY ANTROSTOMY, FRONTAL RECESS EXPLORATION WITH FUSION NAVIGATION;  Surgeon: Daniel Moccasin, MD;  Location: [facility];  Service: ENT;  Laterality: Bilateral;   TUBAL LIGATION  1995    OB History   No obstetric history on file.      Home Medications    Prior to Admission medications   Medication Sig Start Date End Date Taking? Authorizing Provider  amLODipine  (NORVASC ) 10 MG tablet Take  10 mg by mouth daily. 06/03/24  Yes [provider]  cetirizine  (ZYRTEC ) 10 MG tablet Take 1 tablet (10 mg total) by mouth daily as needed for allergies. 10/05/23  Yes Ambs, Arlean HERO, FNP  doxycycline (VIBRA-TABS) 100 MG tablet Take 1 tablet (100 mg total) by mouth 2 (two) times daily for 7 days. 06/20/24 06/27/24 Yes Leath-Warren, Etta PARAS, NP  ezetimibe  (ZETIA ) 10 MG tablet Take 1 tablet (10 mg total) by mouth daily. 08/19/23  Yes Antonetta Rollene BRAVO, MD  fluticasone  (FLONASE ) 50 MCG/ACT nasal spray SHAKE LIQUID AND USE 2 SPRAYS IN EACH NOSTRIL EVERY DAY AS NEEDED FOR NASAL CONGESTION 04/15/24  Yes Iva Marty Saltness, MD  montelukast  (SINGULAIR ) 10 MG tablet TAKE 1 TABLET(10 MG) BY MOUTH AT BEDTIME 04/15/24  Yes Cheryl Reusing, FNP  Multiple Vitamin (MULTIVITAMIN WITH MINERALS) TABS Take 1 tablet by mouth at bedtime.    Yes [provider]  rosuvastatin  (CRESTOR ) 40 MG tablet Take 1 tablet (40 mg total) by mouth daily. 09/26/23  Yes Antonetta Rollene BRAVO, MD  spironolactone  (ALDACTONE ) 50 MG tablet TAKE 1 TABLET(50 MG) BY MOUTH DAILY 04/15/24  Yes Antonetta Rollene BRAVO, MD  albuterol  (VENTOLIN  HFA) 108 (90 Base) MCG/ACT inhaler INHALE 2 PUFFS INTO THE LUNGS EVERY 6 HOURS AS NEEDED FOR WHEEZING OR SHORTNESS OF BREATH 09/04/22   Cheryl Reusing, FNP  Budeson-Glycopyrrol-Formoterol  (BREZTRI  AEROSPHERE) 160-9-4.8 MCG/ACT AERO Inhale 2 puffs into the lungs in the morning and at bedtime. 10/05/23   Ambs, Arlean HERO, FNP  Calcium  Carbonate-Vitamin D  (CALCIUM  600 + D PO) Take 1 tablet by mouth 2 (two) times daily.    [provider]  EPINEPHrine  0.3 mg/0.3 mL IJ SOAJ injection Inject 0.3 mg into the muscle as needed for anaphylaxis. 10/05/23   Cari Arlean HERO, FNP  levocetirizine (XYZAL  ALLERGY 24HR) 5 MG tablet Take 1 tablet (5 mg total) by mouth every evening. 10/05/23   Cari Arlean HERO, FNP  pantoprazole  (PROTONIX ) 40 MG tablet Take 1 tablet (40 mg total) by mouth 2 (two) times daily. 11/26/23  11/25/24  [name] Carlin POUR, DO    Family History Family History  Problem Relation Age of Onset   Hypertension Mother    Heart disease Mother    Diabetes Mother    Alcohol abuse Father    Hypertension Father    Stroke Father    Clotting disorder Father    Heart disease Father    Hypertension Sister    Kidney disease Sister    Diabetes Sister    Hypertension Sister    Diabetes Sister    Diabetes Brother    Heart disease Brother    Hypertension Brother    Colon cancer Neg Hx     Social History Social History   Tobacco Use   Smoking status: Never    Passive exposure: Never   Smokeless tobacco: Never  Vaping Use   Vaping status: Never Used  Substance Use Topics   Alcohol use: No   Drug use: No  Allergies   Pineapple and Bacid   Review of Systems Review of Systems Per HPI  Physical Exam Triage Vital Signs ED Triage Vitals  Encounter Vitals Group     BP 06/20/24 1657 (!) 150/75     Girls Systolic BP Percentile --      Girls Diastolic BP Percentile --      Boys Systolic BP Percentile --      Boys Diastolic BP Percentile --      Pulse Rate 06/20/24 1657 75     Resp 06/20/24 1657 16     Temp 06/20/24 1657 97.8 F (36.6 C)     Temp Source 06/20/24 1657 Oral     SpO2 06/20/24 1657 96 %     Weight --      Height --      Head Circumference --      Peak Flow --      Pain Score 06/20/24 1700 10     Pain Loc --      Pain Education --      Exclude from Growth Chart --    No data found.  Updated Vital Signs BP (!) 150/75 (BP Location: Right Arm)   Pulse 75   Temp 97.8 F (36.6 C) (Oral)   Resp 16   SpO2 96%   Visual Acuity Right Eye Distance:   Left Eye Distance:   Bilateral Distance:    Right Eye Near:   Left Eye Near:    Bilateral Near:     Physical Exam Vitals and nursing note reviewed.  Constitutional:      General: She is not in acute distress.    Appearance: Normal appearance.  HENT:     Head: Normocephalic.  Eyes:      Extraocular Movements: Extraocular movements intact.     Pupils: Pupils are equal, round, and reactive to light.  Cardiovascular:     Rate and Rhythm: Normal rate and regular rhythm.     Pulses: Normal pulses.     Heart sounds: Normal heart sounds.  Pulmonary:     Effort: Pulmonary effort is normal.     Breath sounds: Normal breath sounds.  Abdominal:     General: Bowel sounds are normal.     Palpations: Abdomen is soft.  Musculoskeletal:     Cervical back: Normal range of motion.  Skin:    General: Skin is warm and dry.     Findings: Abscess present.      Neurological:     General: No focal deficit present.     Mental Status: She is alert.  Psychiatric:        Mood and Affect: Mood normal.        Behavior: Behavior normal.      UC Treatments / Results  Labs (all labs ordered are listed, but only abnormal results are displayed) Labs Reviewed - No data to display  EKG   Radiology No results found.  Procedures Procedures (including critical care time)  Medications Ordered in UC Medications - No data to display  Initial Impression / Assessment and Plan / UC Course  I have reviewed the triage vital signs and the nursing notes.  Pertinent labs & imaging results that were available during my care of the patient were reviewed by me and considered in my medical decision making (see chart for details).  Skin abscess noted to the right upper back. The area is firm, non fluctuant and is not amenable to I/D. Start doxycycline 100mg . Supportive  care recommendations were provided and discussed with the patient to include over-the-counter analgesics for pain or discomfort, warm compresses, and cleansing the affected area with an antibacterial soap.  Discussed indications with patient regarding follow-up.  Patient was in agreement with this plan of care and verbalizes understanding.  All questions were answered.  Patient stable for discharge.  Final Clinical Impressions(s) / UC  Diagnoses   Final diagnoses:  Bacterial skin infection  Cutaneous abscess of trunk, unspecified site of trunk     Discharge Instructions      Take medication as prescribed. Apply warm compresses to the affected area 3-4 times daily. Clean the area at least twice daily with Dial Gold bar soap or another type of antibacterial soap. Keep the area covered if it begins to drain. Follow-up in this clinic if you continue to experience pain is, increased swelling, or develop fever or chills.  Follow-up as needed.      ED Prescriptions     Medication Sig Dispense Auth. Provider   doxycycline (VIBRA-TABS) 100 MG tablet Take 1 tablet (100 mg total) by mouth 2 (two) times daily for 7 days. 14 tablet Leath-Warren, Etta PARAS, NP      PDMP not reviewed this encounter.   Gilmer Etta PARAS, NP 06/20/24 1736

## 2024-06-20 NOTE — Discharge Instructions (Signed)
 Take medication as prescribed. Apply warm compresses to the affected area 3-4 times daily. Clean the area at least twice daily with Dial Gold bar soap or another type of antibacterial soap. Keep the area covered if it begins to drain. Follow-up in this clinic if you continue to experience pain is, increased swelling, or develop fever or chills.  Follow-up as needed.

## 2024-06-20 NOTE — ED Triage Notes (Signed)
 Pt states she has a possible abscess on right upper shoulder/back. Pt states she noticed it about 4 years ago and it comes and goes. A week ago it became painful and red.

## 2024-06-24 ENCOUNTER — Ambulatory Visit (INDEPENDENT_AMBULATORY_CARE_PROVIDER_SITE_OTHER): Admitting: Internal Medicine

## 2024-06-24 VITALS — BP 137/74 | HR 101 | Ht 65.0 in | Wt 152.6 lb

## 2024-06-24 DIAGNOSIS — R221 Localized swelling, mass and lump, neck: Secondary | ICD-10-CM | POA: Insufficient documentation

## 2024-06-24 DIAGNOSIS — L02212 Cutaneous abscess of back [any part, except buttock]: Secondary | ICD-10-CM | POA: Diagnosis not present

## 2024-06-24 NOTE — Progress Notes (Unsigned)
 Acute Office Visit  Subjective:    Patient ID: Hannah Hays, female    DOB: 12-Jun-1966, 58 y.o.   MRN: [id number]  Chief Complaint  Patient presents with   Skin Problem    Knot on back area is red and some itchiness.     HPI Patient is in today for complaint of right upper back redness and swelling for the last 1 week.  She has had a cystlike lesion in the area for many years, but started having pain for the last 1 week.  She has been to urgent care, and was given doxycycline  for infection.  Her husband tried to squeeze the abscess yesterday and was able to get some puslike discharge.  She has placed gauze piece covering on the area today.  Denies any fever or chills currently.  She also reports noticing a bump on right side of the neck, which is mildly tender.  Past Medical History:  Diagnosis Date   Allergic rhinitis, seasonal    Anxiety    Asthma    Depression    GERD (gastroesophageal reflux disease)    Heart murmur    Was told this when i was a teenager. No problems so far   Helicobacter pylori gastritis 12/05/2007   s/p Prevpac treatment   History of colonoscopy 12/04/2006   with simple adenoma   Hypertension    IBS (irritable bowel syndrome)    pre-dominant diarrhea   Migraine headache 17   recurrence 1st time in 10 year in 2011    Past Surgical History:  Procedure Laterality Date   ABDOMINAL HYSTERECTOMY     CHOLECYSTECTOMY     COLONOSCOPY  11/13/2007   SLF:A 4 mm sessile transverse colon polyp/Random biopsies obtained throughout the colon/Otherwise normal. Negative for microscopic colitis, simple adenoma, due for surveillance 11/2017 per [facility]   COLONOSCOPY N/A 02/02/2016   Procedure: COLONOSCOPY;  Surgeon: Margo LITTIE Haddock, MD;  Location: [facility];  Service: Endoscopy;  Laterality: N/A;  12:15 PM   COLONOSCOPY WITH PROPOFOL  N/A 06/14/2021   Procedure: COLONOSCOPY WITH PROPOFOL ;  Surgeon: [name] Carlin POUR, DO;  Location: [facility];  Service: Endoscopy;   Laterality: N/A;  ASA II / 12:45   ESOPHAGOGASTRODUODENOSCOPY  03/20/2008   SLF:A 1- to 2-cm hiatal hernia, mild erythema in the antrum/Normal esophagus without evidence of Barrett's, +H.pylori s/p Prevapc treatment    ESOPHAGOGASTRODUODENOSCOPY (EGD) WITH ESOPHAGEAL DILATION N/A 02/14/2013   DOQ:IPDUJO Esophageal web/MILD Non-erosive gastritis (inflammation)   PARTIAL HYSTERECTOMY  2002   SEPTOPLASTY N/A 08/16/2015   Procedure: SEPTOPLASTY;  Surgeon: Daniel Moccasin, MD;  Location: [facility];  Service: ENT;  Laterality: N/A;   SINUS ENDO WITH FUSION Bilateral 08/16/2015   Procedure: ETHMOIDECTOMY, SPHENOIDECTOMY, MAXILLARY ANTROSTOMY, FRONTAL RECESS EXPLORATION WITH FUSION NAVIGATION;  Surgeon: Daniel Moccasin, MD;  Location: [facility];  Service: ENT;  Laterality: Bilateral;   TUBAL LIGATION  1995    Family History  Problem Relation Age of Onset   Hypertension Mother    Heart disease Mother    Diabetes Mother    Alcohol abuse Father    Hypertension Father    Stroke Father    Clotting disorder Father    Heart disease Father    Hypertension Sister    Kidney disease Sister    Diabetes Sister    Hypertension Sister    Diabetes Sister    Diabetes Brother    Heart disease Brother    Hypertension Brother  Colon cancer Neg Hx     Social History   Socioeconomic History   Marital status: Married    Spouse name: Not on file   Number of children: 2   Years of education: Not on file   Highest education level: Some college, no degree  Occupational History   Occupation: employed equity     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[employer]: [employer]  Tobacco Use   Smoking status: Never    Passive exposure: Never   Smokeless tobacco: Never  Vaping Use   Vaping status: Never Used  Substance and Sexual Activity   Alcohol use: No   Drug use: No   Sexual activity: Yes    Birth control/protection: Surgical  Other Topics Concern   Not on file  Social  History Narrative   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   Social Drivers of Health   Financial Resource Strain: Low Risk  (06/24/2024)   Overall Financial Resource Strain (CARDIA)    Difficulty of Paying Living Expenses: Not very hard  Food Insecurity: No Food Insecurity (06/24/2024)   Hunger Vital Sign    Worried About Running Out of Food in the Last Year: Never true    Ran Out of Food in the Last Year: Never true  Transportation Needs: No Transportation Needs (06/24/2024)   PRAPARE - Administrator, Civil Service (Medical): No    Lack of Transportation (Non-Medical): No  Physical Activity: Sufficiently Active (06/24/2024)   Exercise Vital Sign    Days of Exercise per Week: 6 days    Minutes of Exercise per Session: 30 min  Stress: No Stress Concern Present (06/24/2024)   Hannah Hays of Occupational Health - Occupational Stress Questionnaire    Feeling of Stress: Not at all  Social Connections: Moderately Integrated (06/24/2024)   Social Connection and Isolation Panel    Frequency of Communication with Friends and Family: More than three times a week    Frequency of Social Gatherings with Friends and Family: Once a week    Attends Religious Services: More than 4 times per year    Active Member of [employer] or Organizations: No    Attends Engineer, structural: Not on file    Marital Status: Married  Catering manager Violence: Not on file    Outpatient Medications Prior to Visit  Medication Sig Dispense Refill   albuterol  (VENTOLIN  HFA) 108 (90 Base) MCG/ACT inhaler INHALE 2 PUFFS INTO THE LUNGS EVERY 6 HOURS AS NEEDED FOR WHEEZING OR SHORTNESS OF BREATH 18 g 1   amLODipine  (NORVASC ) 10 MG tablet Take 10 mg by mouth daily.     Budeson-Glycopyrrol-Formoterol  (BREZTRI  AEROSPHERE) 160-9-4.8 MCG/ACT AERO Inhale 2 puffs into the lungs in the morning and at bedtime. 1 each 5   Calcium  Carbonate-Vitamin D  (CALCIUM  600 + D PO) Take 1 tablet by mouth 2 (two) times daily.      cetirizine  (ZYRTEC ) 10 MG tablet Take 1 tablet (10 mg total) by mouth daily as needed for allergies. 90 tablet 1   doxycycline  (VIBRA -TABS) 100 MG tablet Take 1 tablet (100 mg total) by mouth 2 (two) times daily for 7 days. 14 tablet 0   EPINEPHrine  0.3 mg/0.3 mL IJ SOAJ injection Inject 0.3 mg into the muscle as needed for anaphylaxis. 1 each 1   ezetimibe  (ZETIA ) 10 MG tablet Take 1 tablet (10 mg total) by mouth daily. 90 tablet 3   fluticasone  (FLONASE ) 50 MCG/ACT nasal spray SHAKE LIQUID  AND USE 2 SPRAYS IN EACH NOSTRIL EVERY DAY AS NEEDED FOR NASAL CONGESTION 16 g 3   levocetirizine (XYZAL  ALLERGY 24HR) 5 MG tablet Take 1 tablet (5 mg total) by mouth every evening. 90 tablet 1   montelukast  (SINGULAIR ) 10 MG tablet TAKE 1 TABLET(10 MG) BY MOUTH AT BEDTIME 90 tablet 0   Multiple Vitamin (MULTIVITAMIN WITH MINERALS) TABS Take 1 tablet by mouth at bedtime.      pantoprazole  (PROTONIX ) 40 MG tablet Take 1 tablet (40 mg total) by mouth 2 (two) times daily. 60 tablet 11   rosuvastatin  (CRESTOR ) 40 MG tablet Take 1 tablet (40 mg total) by mouth daily. 90 tablet 1   spironolactone  (ALDACTONE ) 50 MG tablet TAKE 1 TABLET(50 MG) BY MOUTH DAILY 90 tablet 3   Facility-Administered Medications Prior to Visit  Medication Dose Route Frequency Provider Last Rate Last Admin   tezepelumab -ekko (TEZSPIRE ) 210 MG/1. syringe 210 mg  210 mg Subcutaneous Q28 days Iva Marty Saltness, MD   210 mg at 06/16/24 9167    Allergies  Allergen Reactions   Pineapple Other (See Comments)    Mouth Swelling   Bacid Rash    Review of Systems  Constitutional:  Negative for chills and fever.  HENT:  Negative for congestion and sore throat.   Eyes:  Negative for pain and discharge.  Respiratory:  Negative for cough and shortness of breath.   Cardiovascular:  Negative for chest pain and palpitations.  Gastrointestinal:  Negative for abdominal pain, diarrhea, nausea and vomiting.  Endocrine: Negative for polydipsia  and polyuria.  Genitourinary:  Negative for dysuria and hematuria.  Musculoskeletal:  Negative for neck pain and neck stiffness.  Skin:  Negative for rash.       Abscess over right upper back  Neurological:  Negative for dizziness and weakness.  Psychiatric/Behavioral:  Negative for agitation and behavioral problems.        Objective:    Physical Exam Vitals reviewed.  Constitutional:      General: She is not in acute distress.    Appearance: She is not diaphoretic.  HENT:     Head: Normocephalic and atraumatic.     Nose: Nose normal.     Mouth/Throat:     Mouth: Mucous membranes are moist.  Eyes:     General: No scleral icterus.    Extraocular Movements: Extraocular movements intact.  Neck:     Comments: About 2 cm in diameter mass noted in right side of neck, mildly tender Cardiovascular:     Rate and Rhythm: Normal rate and regular rhythm.     Heart sounds: Normal heart sounds. No murmur heard. Pulmonary:     Breath sounds: Normal breath sounds. No wheezing or rales.  Musculoskeletal:     Cervical back: Neck supple. No tenderness.     Right lower leg: No edema.     Left lower leg: No edema.  Skin:    General: Skin is warm.     Findings: No rash.     Comments: Abscess over right upper back, about 4 cm in diameter with central scab, scant puslike discharge noted  Neurological:     General: No focal deficit present.     Mental Status: She is alert and oriented to person, place, and time.  Psychiatric:        Mood and Affect: Mood normal.        Behavior: Behavior normal.     BP 137/74   Pulse (!) 101   Ht  5' 5 (1.651 m)   Wt 152 lb 9.6 oz (69.2 kg)   SpO2 94%   BMI 25.39 kg/m  Wt Readings from Last 3 Encounters:  06/24/24 152 lb 9.6 oz (69.2 kg)  05/16/24 150 lb 1 oz (68.1 kg)  02/26/24 156 lb 0.6 oz (70.8 kg)        Assessment & Plan:   Problem List Items Addressed This Visit       Other   Abscess of upper back excluding scapular region -  Primary   Continue doxycycline  Advised to apply warm compresses Can use Neosporin locally for itching Referred to General Surgery for possible need of I&D      Relevant Orders   Ambulatory referral to General Surgery   Neck mass   Right-sided neck mass, mildly tender - could be reactive cervical LAD If persistent, may need imaging of soft tissue neck        No orders of the defined types were placed in this encounter.    Suzzane MARLA Blanch, MD

## 2024-06-24 NOTE — Patient Instructions (Signed)
 Please continue taking Doxycycline  as prescribed.  Please apply warm compresses for local soreness.

## 2024-06-25 NOTE — Assessment & Plan Note (Addendum)
 Right-sided neck mass, mildly tender - could be reactive cervical LAD If persistent, may need imaging of soft tissue neck

## 2024-06-25 NOTE — Assessment & Plan Note (Signed)
 Continue doxycycline  Advised to apply warm compresses Can use Neosporin locally for itching Referred to General Surgery for possible need of I&D

## 2024-07-01 ENCOUNTER — Other Ambulatory Visit: Payer: Self-pay | Admitting: Allergy & Immunology

## 2024-07-08 ENCOUNTER — Encounter: Payer: Self-pay | Admitting: General Surgery

## 2024-07-08 ENCOUNTER — Ambulatory Visit (INDEPENDENT_AMBULATORY_CARE_PROVIDER_SITE_OTHER): Admitting: General Surgery

## 2024-07-08 VITALS — BP 146/78 | HR 79 | Temp 98.7°F | Resp 12 | Ht 65.0 in | Wt 150.0 lb

## 2024-07-08 DIAGNOSIS — L723 Sebaceous cyst: Secondary | ICD-10-CM

## 2024-07-08 DIAGNOSIS — L720 Epidermal cyst: Secondary | ICD-10-CM | POA: Diagnosis not present

## 2024-07-08 DIAGNOSIS — L089 Local infection of the skin and subcutaneous tissue, unspecified: Secondary | ICD-10-CM | POA: Insufficient documentation

## 2024-07-08 MED ORDER — OXYCODONE HCL 5 MG PO TABS: 5.0000 mg | ORAL_TABLET | ORAL | 0 refills | Status: DC | PRN

## 2024-07-08 MED ORDER — AMOXICILLIN-POT CLAVULANATE 875-125 MG PO TABS: 1.0000 | ORAL_TABLET | Freq: Two times a day (BID) | ORAL | 0 refills | Status: AC

## 2024-07-08 NOTE — Patient Instructions (Signed)
 Dressing: Remove dressing on 07/09/24 after you have showered. After the shower, have your husband remove the clear sticky bandage, the gauze, and the shoe string (iodoform) tape from the wound.  He will then pack the area with the shoe-string (iodoform) tape. I used about 1 foot of this, and he may have to use the same or less. Then place a gauze over the area and secure with the tape.  Take the antibiotic as prescribed.  Medication: Take tylenol  and ibuprofen  as needed for pain control, alternating every 4-6 hours.  Example:  Tylenol  1000mg  @ 6am, 12noon, 6pm, (Do not exceed 4000mg  of tylenol  a day). Ibuprofen  800mg  @ 9am, 3pm, 9pm, 3am (Do not exceed 3600mg  of ibuprofen  a day).  Take Roxicodone  for breakthrough pain every 4 hours.  Take Colace for constipation related to narcotic pain medication. If you do not have a bowel movement in 2 days, take Miralax over the counter.  Drink plenty of water  to also prevent constipation.   Contact Information: If you have questions or concerns, please call our office, [phone], Monday- Thursday 8AM-5PM and Friday 8AM-12Noon.  If it is after hours or on the weekend, please call [facility] Main Number, [phone], [phone], and ask to speak to the surgeon on call for Dr. Kallie at [facility].

## 2024-07-08 NOTE — Progress Notes (Signed)
 [facility] History and Physical  Reason for Referral:*** Referring Physician: ***  Chief Complaint   New Patient (Initial Visit)     Hannah Hays is a 58 y.o. female.  HPI:   Discussed the use of AI scribe software for clinical note transcription with the patient, who gave verbal consent to proceed.  History of Present Illness      ***.  The *** started *** and has had a duration of ***.  It is associated with ***.  The *** is improved with ***, and is made worse with ***.    Quality*** Context***  Past Medical History:  Diagnosis Date  . Allergic rhinitis, seasonal   . Anxiety   . Asthma   . Depression   . GERD (gastroesophageal reflux disease)   . Heart murmur    Was told this when i was a teenager. No problems so far  . Helicobacter pylori gastritis 12/05/2007   s/p Prevpac treatment  . History of colonoscopy 12/04/2006   with simple adenoma  . Hypertension   . IBS (irritable bowel syndrome)    pre-dominant diarrhea  . Migraine headache 17   recurrence 1st time in 10 year in 2011    Past Surgical History:  Procedure Laterality Date  . ABDOMINAL HYSTERECTOMY    . CHOLECYSTECTOMY    . COLONOSCOPY  11/13/2007   SLF:A 4 mm sessile transverse colon polyp/Random biopsies obtained throughout the colon/Otherwise normal. Negative for microscopic colitis, simple adenoma, due for surveillance 11/2017 per SL[facility]  . COLONOSCOPY N/A 02/02/2016   Procedure: COLONOSCOPY;  Surgeon: Margo LITTIE Haddock, MD;  [facility];  Service: Endoscopy;  Laterality: N/A;  12:15 PM  . COLONOSCOPY WITH PROPOFOL  N/A 06/14/2021   Procedure: COLONOSCOPY WITH PROPOFOL ;  Surgeon: [name] Carlin POUR, DO;  [facility];  Service: Endoscopy;  Laterality: N/A;  ASA II / 12:45  . ESOPHAGOGASTRODUODENOSCOPY  03/20/2008   SLF:A 1- to 2-cm hiatal hernia, mild erythema in the antrum/Normal esophagus without evidence of Barrett's, +H.pylori s/p Prevapc treatment   .  ESOPHAGOGASTRODUODENOSCOPY (EGD) WITH ESOPHAGEAL DILATION N/A 02/14/2013   DOQ:IPDUJO Esophageal web/MILD Non-erosive gastritis (inflammation)  . PARTIAL HYSTERECTOMY  2002  . SEPTOPLASTY N/A 08/16/2015   Procedure: SEPTOPLASTY;  Surgeon: Daniel Moccasin, MD;  Location: [facility];  Service: ENT;  Laterality: N/A;  . SINUS ENDO WITH FUSION Bilateral 08/16/2015   Procedure: ETHMOIDECTOMY, SPHENOIDECTOMY, MAXILLARY ANTROSTOMY, FRONTAL RECESS EXPLORATION WITH FUSION NAVIGATION;  Surgeon: Daniel Moccasin, MD;  Location: [facility];  Service: ENT;  Laterality: Bilateral;  . TUBAL LIGATION  1995    Family History  Problem Relation Age of Onset  . Hypertension Mother   . Heart disease Mother   . Diabetes Mother   . Alcohol abuse Father   . Hypertension Father   . Stroke Father   . Clotting disorder Father   . Heart disease Father   . Hypertension Sister   . Kidney disease Sister   . Diabetes Sister   . Hypertension Sister   . Diabetes Sister   . Diabetes Brother   . Heart disease Brother   . Hypertension Brother   . Colon cancer Neg Hx     Social History   Tobacco Use  . Smoking status: Never    Passive exposure: Never  . Smokeless tobacco: Never  Vaping Use  . Vaping status: Never Used  Substance Use Topics  . Alcohol use: No  . Drug use: No  Medications: {medication reviewed/display:3041432} Allergies as of 07/08/2024       Reactions   Pineapple Other (See Comments)   Mouth Swelling   Bacid Rash        Medication List        Accurate as of July 08, 2024 10:04 AM. If you have any questions, ask your nurse or doctor.          albuterol  108 (90 Base) MCG/ACT inhaler Commonly known as: VENTOLIN  HFA INHALE 2 PUFFS INTO THE LUNGS EVERY 6 HOURS AS NEEDED FOR WHEEZING OR SHORTNESS OF BREATH   amLODipine  10 MG tablet Commonly known as: NORVASC  Take 10 mg by mouth daily.   amoxicillin -clavulanate 875-125 MG tablet Commonly known as:  AUGMENTIN  Take 1 tablet by mouth 2 (two) times daily for 7 days. Started by: Hannah Hays   Breztri  Aerosphere 160-9-4.8 MCG/ACT Aero inhaler Generic drug: budesonide -glycopyrrolate -formoterol  Inhale 2 puffs into the lungs in the morning and at bedtime.   CALCIUM  600 + D PO Take 1 tablet by mouth 2 (two) times daily.   cetirizine  10 MG tablet Commonly known as: ZYRTEC  Take 1 tablet (10 mg total) by mouth daily as needed for allergies.   EPINEPHrine  0.3 mg/0.3 mL Soaj injection Commonly known as: EPI-PEN Inject 0.3 mg into the muscle as needed for anaphylaxis.   ezetimibe  10 MG tablet Commonly known as: Zetia  Take 1 tablet (10 mg total) by mouth daily.   fluticasone  50 MCG/ACT nasal spray Commonly known as: FLONASE  SHAKE LIQUID AND USE 2 SPRAYS IN EACH NOSTRIL EVERY DAY AS NEEDED FOR NASAL CONGESTION   levocetirizine 5 MG tablet Commonly known as: Xyzal  Allergy 24HR Take 1 tablet (5 mg total) by mouth every evening.   montelukast  10 MG tablet Commonly known as: SINGULAIR  TAKE 1 TABLET(10 MG) BY MOUTH AT BEDTIME   multivitamin with minerals Tabs tablet Take 1 tablet by mouth at bedtime.   oxyCODONE  5 MG immediate release tablet Commonly known as: Roxicodone  Take 1 tablet (5 mg total) by mouth every 4 (four) hours as needed for severe pain (pain score 7-10) or breakthrough pain. Started by: Hannah Hays   pantoprazole  40 MG tablet Commonly known as: Protonix  Take 1 tablet (40 mg total) by mouth 2 (two) times daily.   rosuvastatin  40 MG tablet Commonly known as: CRESTOR  Take 1 tablet (40 mg total) by mouth daily.   spironolactone  50 MG tablet Commonly known as: ALDACTONE  TAKE 1 TABLET(50 MG) BY MOUTH DAILY   Tezspire  210 MG/1. Soaj Generic drug: Tezepelumab -ekko INJECT 210 MG UNDER THE SKIN (SUBCUTANEOUS INJECTION) ONCE EVERY FOUR WEEKS ADMINISTERED BY HEALTHCARE PROFESSIONAL         ROS:  {Review of Systems:30496}  Blood pressure (!)  146/78, pulse 79, temperature 98.7 F (37.1 C), temperature source Oral, resp. rate 12, height 5' 5 (1.651 m), weight 150 lb (68 kg), SpO2 96%. Physical Exam Physical Exam   Results: No results found for this or any previous visit (from the past 48 hours).  No results found.   Assessment and Plan: Assessment and Plan Assessment & Plan      KIERSTAN AUER is a 58 y.o. female with *** -*** -*** -Follow up ***  All questions were answered to the satisfaction of the patient and family***.  The risk and benefits of *** were discussed including but not limited to ***.  After careful consideration, Hannah Hays has decided to ***.    Hannah Hays 07/08/2024, 10:04 AM

## 2024-07-10 ENCOUNTER — Ambulatory Visit: Admitting: General Surgery

## 2024-07-10 LAB — TISSUE SPECIMEN

## 2024-07-10 LAB — PATHOLOGY REPORT

## 2024-07-11 ENCOUNTER — Ambulatory Visit: Payer: BC Managed Care – PPO | Admitting: Allergy & Immunology

## 2024-07-11 ENCOUNTER — Encounter: Payer: Self-pay | Admitting: Allergy & Immunology

## 2024-07-11 VITALS — BP 120/82 | HR 71 | Temp 98.0°F | Resp 16 | Wt 152.4 lb

## 2024-07-11 DIAGNOSIS — J3089 Other allergic rhinitis: Secondary | ICD-10-CM | POA: Diagnosis not present

## 2024-07-11 DIAGNOSIS — J4550 Severe persistent asthma, uncomplicated: Secondary | ICD-10-CM | POA: Diagnosis not present

## 2024-07-11 DIAGNOSIS — T7800XD Anaphylactic reaction due to unspecified food, subsequent encounter: Secondary | ICD-10-CM | POA: Diagnosis not present

## 2024-07-11 DIAGNOSIS — T7800XA Anaphylactic reaction due to unspecified food, initial encounter: Secondary | ICD-10-CM

## 2024-07-11 DIAGNOSIS — J339 Nasal polyp, unspecified: Secondary | ICD-10-CM

## 2024-07-11 MED ORDER — BREZTRI AEROSPHERE 160-9-4.8 MCG/ACT IN AERO: 2.0000 | INHALATION_SPRAY | Freq: Two times a day (BID) | RESPIRATORY_TRACT | 5 refills | Status: AC

## 2024-07-11 NOTE — Progress Notes (Signed)
 FOLLOW UP  Date of Service/Encounter:  07/11/24   Assessment:   Moderate persistent asthma - needs to restart the Fasenra  (repeat CBC with differential pending)   Perennial allergic rhinitis  Nasal polyposis - s/p polypectomy around 2016 (has seen Dr. Karis in the past)   Adverse food reaction  Plan/Recommendations:   1. Moderate persistent asthma, uncomplicated - Lung testing looks a bit worse today, but I am not going to worry about that today. - This might be because you have been sick for a period of time.  - Daily controller medication(s): Breztri  two puffs twice daily with spacer + Singulair  10mg  daily + Tezspire  monthly  - Rescue medications: albuterol  2 puffs every 4-6 hours as needed - Asthma control goals:  * Full participation in all desired activities (may need albuterol  before activity) * Albuterol  use two time or less a week on average (not counting use with activity) * Cough interfering with sleep two time or less a month * Oral steroids no more than once a year * No hospitalizations  2. Perennial allergic rhinitis  - Continue with Flonase  Sensimist two puffs once daily as needed for stuffy nose.   - Continue with nasal saline rinses 1-2 times daily.  - Continue with Singulair  10mg  daily. - Continue Zyrtec  10 mg and Xyzal  5mg  at night.   3.  Food allergy Continue to avoid pineapple.   In case of an allergic reaction, take Zyrtec  20mg  every 6 hours, and if life-threatening symptoms occur, inject with EpiPen  0.3 mg.  4.  Epistaxis Pinch both nostrils while leaning forward for at least 5 minutes before checking to see if the bleeding has stopped. If bleeding is not controlled within 5-10 minutes apply a cotton ball soaked with oxymetazoline  (Afrin) to the bleeding nostril for a few seconds.  If the problem persists or worsens a referral to ENT for further evaluation may be necessary.   5.Dysphagia Continue to with Nexium  daily.  6. Nasal  polyposis Continue Flonase  Sensimist as tolerated We could consider changing your biologic to Dupixent for control of nasal polyposis.  7. Return in about 3 months (around 10/11/2024) since your breathing test was a bit lower today. You can have the follow up appointment with Dr. Iva or a Nurse Practicioner (our Nurse Practitioners are excellent and always have Physician oversight!).   Subjective:   Hannah Hays is a 58 y.o. female presenting today for follow up of  Chief Complaint  Patient presents with   Follow-up    ACT:23    Hannah Hays has a history of the following: Patient Active Problem List   Diagnosis Date Noted   Infected sebaceous cyst 07/08/2024   Abscess of upper back excluding scapular region 06/24/2024   Neck mass 06/24/2024   Allergy with anaphylaxis due to food 10/05/2023   Epistaxis 10/05/2023   Eosinophilic esophagitis 10/05/2023   Encounter for immunization 10/01/2023   Esophageal dysphagia 10/01/2023   Chronic pain of left elbow 09/07/2022   Posterior right knee pain 12/16/2020   Overweight (BMI 25.0-29.9) 06/30/2020   Insomnia 08/07/2018   Hyperlipidemia LDL goal <100 10/16/2017   Perennial allergic rhinitis 08/28/2017   Moderate persistent asthma, uncomplicated 08/28/2017   Hx of adenomatous colonic polyps    Lipoma 12/26/2015   Liver mass, left lobe 02/11/2015   Nasal polyposis 08/20/2014   Chronic nonallergic rhinitis 08/20/2014   Vitamin D  deficiency 07/12/2012   HTN (hypertension) 08/19/2011   GERD (gastroesophageal reflux disease) 04/11/2011  IBS 12/19/2010   Migraine 01/19/2010   IGT (impaired glucose tolerance) 12/26/2009   Asthma 11/15/2007    History obtained from: chart review and patient.  Discussed the use of AI scribe software for clinical note transcription with the patient and/or guardian, who gave verbal consent to proceed.  Hannah Hays is a 58 y.o. female presenting for a follow up visit. She was last seen in February  2025. At that time, she was doing well with Breztri  two puffs BID.  She was continued on Singulair  10 daily.  For her rhinitis, she was continued on the Singulair  as well as Zyrtec .  She continue to avoid pineapple.  For her dysphagia, she was seeing gastroenterology.  For her nasal polyps, she was continued on Flonase .  Since last visit, she has done well.  Asthma/Respiratory Symptom History: She does feel that her Tezspire  is helping her. She remains on her Breztri  two puffs twice daily. Her copays for the inhaler vary widely. 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 Last prednisone  was over a year ago. She has been managing her asthma with Breztri , two puffs twice daily. Occasionally, she obtains it for free with a copay card, but at other times, she pays $73. She has not required a refill recently. She last used her albuterol  inhaler at work due to smoke from burning nuggets, an unusual occurrence for her. Her work environment is generally safe for her breathing, although she is exposed to [employer] fumes from the dock area daily. She has not needed prednisone  for her breathing in a while and experiences no nocturnal coughing.  Allergic Rhinitis Symptom History: For environmental allergies, she takes Singulair  daily, Flonase , Zyrtec , and Xyzal , and does not need refills for these medications.  Never been on allergy shots. She has nasal polyps. She does not recall being on Dupixent for asthma and nasal polyps, but mentions that Testfire, which she is on, is painful to administer.   Food Allergy Symptom History: She is allergic to pineapple.  GERD Symptom History: Her last endoscopy was December 2024. She had an endoscopy about a year ago, which was unremarkable. She takes Nexium  for reflux, purchasing it over the counter, and mentions the cost as significant.   STOMACH, BIOPSY:  Gastric antral/oxyntic mucosa with no  significant diagnostic alteration.  No H.pylori identified on HE stain.  Negative for intestinal metaplasia or dysplasia.   Infection Symptom History: She had a large abscess on her back. She is on Augmentin . She is dealing with an infected inclusion cyst on her back, present for six to seven years but recently infected. It was drained and cut twice 3 days ago by Dr. Kallie, and she is currently on amoxicillin -clavulanic acid. She experiences pain when taking a deep breath and moving on that side. She uses a heat pad to help bring the infection to a head. Wound culture is NGTD.   She has been regularly going to [place]  later this month for their anniversary.  Otherwise, there have been no changes to her past medical history, surgical history, family history, or social history.    Review of systems otherwise negative other than that mentioned in the HPI.    Objective:   Blood pressure 120/82, pulse 71, temperature 98 F (36.7 C), resp. rate 16, weight 152 lb 6 oz (69.1 kg), SpO2 97%. Body mass index is 25.36 kg/m.    Physical Exam Vitals reviewed.  Constitutional:      Appearance: She is well-developed.     Comments: Pleasant. Talkative. Smiling.   HENT:     Head: Normocephalic and atraumatic.     Right Ear: Tympanic membrane, ear canal and external ear normal.     Left Ear: Tympanic membrane, ear canal and external ear normal.     Nose: No nasal deformity, septal deviation, mucosal edema or rhinorrhea.     Right Turbinates: Enlarged, swollen and pale.     Left Turbinates: Enlarged, swollen and pale.     Right Sinus: No maxillary sinus tenderness or frontal sinus tenderness.     Left Sinus: No maxillary sinus tenderness or frontal sinus tenderness.     Comments: No polyps.    Mouth/Throat:     Lips: Pink.     Mouth: Mucous membranes are moist. Mucous membranes are not pale and not dry.     Pharynx: Uvula midline.     Comments: Mild cobblestoning. Eyes:     General: Lids  are normal. No allergic shiner.       Right eye: No discharge.        Left eye: No discharge.     Conjunctiva/sclera: Conjunctivae normal.     Right eye: Right conjunctiva is not injected. No chemosis.    Left eye: Left conjunctiva is not injected. No chemosis.    Pupils: Pupils are equal, round, and reactive to light.  Cardiovascular:     Rate and Rhythm: Normal rate and regular rhythm.     Heart sounds: Normal heart sounds.  Pulmonary:     Effort: Pulmonary effort is normal. No tachypnea, accessory muscle usage or respiratory distress.     Breath sounds: Normal breath sounds. No wheezing, rhonchi or rales.     Comments: Decreased air movement at the bases.  No increased work of breathing. Chest:     Chest wall: No tenderness.  Lymphadenopathy:     Cervical: No cervical adenopathy.  Skin:    General: Skin is warm.     Capillary Refill: Capillary refill takes less than 2 seconds.     Coloration: Skin is not pale.     Findings: No abrasion, erythema, petechiae or rash. Rash is not papular, urticarial or vesicular.  Neurological:     Mental Status: She is alert.  Psychiatric:        Behavior: Behavior is cooperative.      Diagnostic studies:    Spirometry: results abnormal (FEV1: 1.35/60%, FVC: 1.96/69%, FEV1/FVC: 69%).    Spirometry consistent with possible restrictive disease.  The FEV1 and FVC are lower than that obtained at the last visit. However, this might have been related to her infected cyst on her back and pain with expiration.   Allergy Studies: none       Hannah Shaggy, MD  [facility] 

## 2024-07-11 NOTE — Patient Instructions (Addendum)
 1. Moderate persistent asthma, uncomplicated - Lung testing looks a bit worse today, but I am not going to worry about that today. - This might be because you have been sick for a period of time.  - Daily controller medication(s): Breztri  two puffs twice daily with spacer + Singulair  10mg  daily + Tezspire  monthly  - Rescue medications: albuterol  2 puffs every 4-6 hours as needed - Asthma control goals:  * Full participation in all desired activities (may need albuterol  before activity) * Albuterol  use two time or less a week on average (not counting use with activity) * Cough interfering with sleep two time or less a month * Oral steroids no more than once a year * No hospitalizations  2. Perennial allergic rhinitis  - Continue with Flonase  Sensimist two puffs once daily as needed for stuffy nose.   - Continue with nasal saline rinses 1-2 times daily.  - Continue with Singulair  10mg  daily. - Continue Zyrtec  10 mg and Xyzal  5mg  at night.   3.  Food allergy Continue to avoid pineapple.   In case of an allergic reaction, take Zyrtec  20mg  every 6 hours, and if life-threatening symptoms occur, inject with EpiPen  0.3 mg.  4.  Epistaxis Pinch both nostrils while leaning forward for at least 5 minutes before checking to see if the bleeding has stopped. If bleeding is not controlled within 5-10 minutes apply a cotton ball soaked with oxymetazoline  (Afrin) to the bleeding nostril for a few seconds.  If the problem persists or worsens a referral to ENT for further evaluation may be necessary.   5.Dysphagia Continue to with Nexium  daily.  6. Nasal polyposis Continue Flonase  Sensimist as tolerated We could consider changing your biologic to Dupixent for control of nasal polyposis.  7. Return in about 3 months (around 10/11/2024) since your breathing test was a bit lower today. You can have the follow up appointment with Dr. Iva or a Nurse Practicioner (our Nurse Practitioners are excellent  and always have Physician oversight!).    Please inform us  of any Emergency Department visits, hospitalizations, or changes in symptoms. Call us  before going to the ED for breathing or allergy symptoms since we might be able to fit you in for a sick visit. Feel free to contact us  anytime with any questions, problems, or concerns.  It was a pleasure to see you again today!  Websites that have reliable patient information: 1. American Academy of Asthma, Allergy, and Immunology: www.aaaai.org 2. Food Allergy Research and Education (FARE): foodallergy.org 3. Mothers of Asthmatics: http://www.asthmacommunitynetwork.org 4. American College of Allergy, Asthma, and Immunology: www.acaai.org      "Like" us  on Facebook and Instagram for our latest updates!      A healthy democracy works best when [employer] participate! Make sure you are registered to vote! If you have moved or changed any of your contact information, you will need to get this updated before voting! Scan the QR codes below to learn more!

## 2024-07-12 LAB — WOUND CULTURE
MICRO NUMBER:: 16790477
RESULT:: NO GROWTH
SPECIMEN QUALITY:: ADEQUATE

## 2024-07-14 ENCOUNTER — Ambulatory Visit

## 2024-07-14 DIAGNOSIS — J4550 Severe persistent asthma, uncomplicated: Secondary | ICD-10-CM

## 2024-07-16 ENCOUNTER — Ambulatory Visit (INDEPENDENT_AMBULATORY_CARE_PROVIDER_SITE_OTHER): Admitting: General Surgery

## 2024-07-16 ENCOUNTER — Encounter: Payer: Self-pay | Admitting: General Surgery

## 2024-07-16 ENCOUNTER — Encounter: Admitting: General Surgery

## 2024-07-16 ENCOUNTER — Other Ambulatory Visit: Payer: Self-pay

## 2024-07-16 VITALS — BP 122/65 | HR 55 | Temp 98.0°F | Resp 14 | Ht 65.0 in | Wt 152.0 lb

## 2024-07-16 DIAGNOSIS — L089 Local infection of the skin and subcutaneous tissue, unspecified: Secondary | ICD-10-CM

## 2024-07-16 DIAGNOSIS — L723 Sebaceous cyst: Secondary | ICD-10-CM

## 2024-07-16 NOTE — Patient Instructions (Signed)
 Continue to pack the wound daily. Call if you need more packing.

## 2024-07-16 NOTE — Progress Notes (Signed)
 [facility]  Doing well. Packing it daily.  BP 122/65   Pulse (!) 55   Temp 98 F (36.7 C) (Oral)   Resp 14   Ht 5' 5 (1.651 m)   Wt 152 lb (68.9 kg)   SpO2 97%   BMI 25.29 kg/m  Area healing, granulating, packing replaced, still indurated  Patient s/p I&D of infected cyst. Doing well Continue to pack the wound daily. Call if you need more packing.   Future Appointments  Date Time Provider Department Center  08/06/2024  7:30 AM [facility] 1 [facility]  08/06/2024 11:15 AM Kallie Manuelita BROCKS, MD [facility] None  08/11/2024  9:00 AM [facility] NURSE [facility] None  10/15/2024  8:40 AM Antonetta Rollene [place], MD [facility]  10/22/2024  8:30 AM Iva Marty Saltness, MD [facility] None    Manuelita Kallie, MD [facility] [address] [place], [place] [address] [phone] (office)

## 2024-07-24 ENCOUNTER — Ambulatory Visit (HOSPITAL_COMMUNITY): Payer: BC Managed Care – PPO

## 2024-08-06 ENCOUNTER — Encounter: Payer: Self-pay | Admitting: General Surgery

## 2024-08-06 ENCOUNTER — Ambulatory Visit (HOSPITAL_COMMUNITY)
Admission: RE | Admit: 2024-08-06 | Discharge: 2024-08-06 | Disposition: A | Discharge status: Home / Self Care | Source: Ambulatory Visit | Attending: Family Medicine | Admitting: Family Medicine

## 2024-08-06 ENCOUNTER — Ambulatory Visit (INDEPENDENT_AMBULATORY_CARE_PROVIDER_SITE_OTHER): Admitting: General Surgery

## 2024-08-06 ENCOUNTER — Encounter (HOSPITAL_COMMUNITY): Payer: Self-pay

## 2024-08-06 VITALS — BP 159/84 | HR 77 | Temp 98.0°F | Resp 16 | Ht 65.0 in | Wt 154.0 lb

## 2024-08-06 DIAGNOSIS — L089 Local infection of the skin and subcutaneous tissue, unspecified: Secondary | ICD-10-CM

## 2024-08-06 DIAGNOSIS — L723 Sebaceous cyst: Secondary | ICD-10-CM

## 2024-08-06 DIAGNOSIS — Z1231 Encounter for screening mammogram for malignant neoplasm of breast: Secondary | ICD-10-CM | POA: Insufficient documentation

## 2024-08-06 NOTE — Progress Notes (Signed)
 [facility]  Doing well. Packing area.  BP (!) 159/84   Pulse 77   Temp 98 F (36.7 C) (Oral)   Resp 16   Ht 5' 5 (1.651 m)   Wt 154 lb (69.9 kg)   SpO2 96%   BMI 25.63 kg/m  Right back wound with packing, no erythema and less induration  Patient with infected cyst on her back s/p I&D.  Continue packing daily and cover with bandaid until you can see the pink inside just under the skin. This will take another 2-3 weeks. Potentially longer.   Future Appointments  Date Time Provider Department Center  08/11/2024  9:00 AM [facility] NURSE [facility] None  09/02/2024 11:15 AM Kallie Manuelita BROCKS, MD [facility] None  10/15/2024  8:40 AM Antonetta Rollene BRAVO, MD [facility] [address]  10/22/2024  8:30 AM Iva Marty Saltness, MD [facility] None   Manuelita Kallie, MD [facility] [address] [place], [place] [address] [phone] (office)

## 2024-08-06 NOTE — Patient Instructions (Signed)
 Continue packing daily and cover with bandaid until you can see the pink inside just under the skin. This will take another 2-3 weeks. Potentially longer.

## 2024-08-11 ENCOUNTER — Ambulatory Visit

## 2024-08-11 DIAGNOSIS — J4550 Severe persistent asthma, uncomplicated: Secondary | ICD-10-CM

## 2024-09-02 ENCOUNTER — Encounter: Payer: Self-pay | Admitting: General Surgery

## 2024-09-02 ENCOUNTER — Ambulatory Visit (INDEPENDENT_AMBULATORY_CARE_PROVIDER_SITE_OTHER): Admitting: General Surgery

## 2024-09-02 VITALS — BP 150/82 | HR 60 | Temp 98.0°F | Resp 16 | Ht 65.0 in | Wt 155.0 lb

## 2024-09-02 DIAGNOSIS — L723 Sebaceous cyst: Secondary | ICD-10-CM

## 2024-09-02 DIAGNOSIS — L089 Local infection of the skin and subcutaneous tissue, unspecified: Secondary | ICD-10-CM

## 2024-09-03 NOTE — Progress Notes (Signed)
 [facility]  Back area healing.   BP (!) 150/82   Pulse 60   Temp 98 F (36.7 C) (Oral)   Resp 16   Ht 5' 5 (1.651 m)   Wt 155 lb (70.3 kg)   SpO2 96%   BMI 25.79 kg/m  Packing removed. Repacked. No erythema or drainage.  Patient s/p I&D of infected cyst. Discussed continued packing and eventual neosporin. Discussed potential for recurrence given fact some of the wall could still be present.  PRN Follow up.  Manuelita Pander, MD [facility] [address] [place], [place] [address] [phone] (office)

## 2024-09-08 ENCOUNTER — Ambulatory Visit (INDEPENDENT_AMBULATORY_CARE_PROVIDER_SITE_OTHER)

## 2024-09-08 DIAGNOSIS — J4550 Severe persistent asthma, uncomplicated: Secondary | ICD-10-CM

## 2024-09-12 ENCOUNTER — Ambulatory Visit: Admitting: Family Medicine

## 2024-09-19 ENCOUNTER — Ambulatory Visit: Payer: Self-pay

## 2024-09-23 DIAGNOSIS — Z01419 Encounter for gynecological examination (general) (routine) without abnormal findings: Secondary | ICD-10-CM | POA: Diagnosis not present

## 2024-09-30 ENCOUNTER — Other Ambulatory Visit: Payer: Self-pay | Admitting: Family Medicine

## 2024-10-15 ENCOUNTER — Encounter: Payer: Self-pay | Admitting: Family Medicine

## 2024-10-15 ENCOUNTER — Ambulatory Visit (INDEPENDENT_AMBULATORY_CARE_PROVIDER_SITE_OTHER): Admitting: Family Medicine

## 2024-10-15 VITALS — BP 120/70 | HR 60 | Resp 16 | Ht 65.0 in | Wt 154.0 lb

## 2024-10-15 DIAGNOSIS — J4540 Moderate persistent asthma, uncomplicated: Secondary | ICD-10-CM

## 2024-10-15 DIAGNOSIS — I10 Essential (primary) hypertension: Secondary | ICD-10-CM

## 2024-10-15 DIAGNOSIS — G43C0 Periodic headache syndromes in child or adult, not intractable: Secondary | ICD-10-CM

## 2024-10-15 DIAGNOSIS — E785 Hyperlipidemia, unspecified: Secondary | ICD-10-CM | POA: Diagnosis not present

## 2024-10-15 DIAGNOSIS — Z23 Encounter for immunization: Secondary | ICD-10-CM | POA: Insufficient documentation

## 2024-10-15 DIAGNOSIS — M25522 Pain in left elbow: Secondary | ICD-10-CM

## 2024-10-15 DIAGNOSIS — G8929 Other chronic pain: Secondary | ICD-10-CM

## 2024-10-15 DIAGNOSIS — R7302 Impaired glucose tolerance (oral): Secondary | ICD-10-CM

## 2024-10-15 MED ORDER — EZETIMIBE 10 MG PO TABS: 10.0000 mg | ORAL_TABLET | Freq: Every day | ORAL | 3 refills | Status: AC

## 2024-10-15 MED ORDER — PREDNISONE 5 MG PO TABS: 5.0000 mg | ORAL_TABLET | Freq: Two times a day (BID) | ORAL | 0 refills | Status: AC

## 2024-10-15 NOTE — Assessment & Plan Note (Addendum)
 Controlled, no change in medication DASH diet and commitment to daily physical activity for a minimum of 30 minutes discussed and encouraged, as a part of hypertension management. The importance of attaining a healthy weight is also discussed.     10/15/2024    8:59 AM 10/15/2024    8:38 AM 09/02/2024   11:22 AM 08/06/2024   11:16 AM 07/16/2024   11:29 AM 07/11/2024    8:56 AM 07/08/2024    9:11 AM  BP/Weight  Systolic BP 120 146 150 159 122 120 146  Diastolic BP 70 79 82 84 65 82 78  Wt. (Lbs)  154 155 154 152 152.38 150  BMI  25.63 kg/m2 25.79 kg/m2 25.63 kg/m2 25.29 kg/m2 25.36 kg/m2 24.96 kg/m2

## 2024-10-15 NOTE — Progress Notes (Signed)
 Hannah Hays     MRN: [id number]      DOB: Jan 24, 1966  Chief Complaint  Patient presents with   Hypertension    Follow up     HPI Hannah Hays is here for follow up and re-evaluation of chronic medical conditions, medication management and review of any available recent lab and radiology data.  Preventive health is updated, specifically  Cancer screening and Immunization.   Questions or concerns regarding consultations or procedures which the PT has had in the interim are  addressed. The PT denies any adverse reactions to current medications since the last visit.  There are no new concerns.  There are no specific complaints   ROS Denies recent fever or chills. Denies sinus pressure, nasal congestion, ear pain or sore throat. Denies chest congestion, productive cough or wheezing. Denies chest pains, palpitations and leg swelling Denies abdominal pain, nausea, vomiting,diarrhea or constipation.   Denies dysuria, frequency, hesitancy or incontinence. Still c/o intermittent left elbow and forearm tinglng, hand not affected and no weakness Denies headaches, seizures, numbness, or tingling. Denies depression, anxiety or insomnia. Denies skin break down or rash.   PE  BP 120/70   Pulse 60   Resp 16   Ht 5' 5 (1.651 m)   Wt 154 lb (69.9 kg)   SpO2 98%   BMI 25.63 kg/m   Patient alert and oriented and in no cardiopulmonary distress.  HEENT: No facial asymmetry, EOMI,     Neck supple .  Chest: Clear to auscultation bilaterally.  CVS: S1, S2 no murmurs, no S3.Regular rate.  ABD: Soft non tender.   Ext: No edema  MS: Adequate ROM spine, shoulders, hips and knees.Tender over lateral epicondyle left  Skin: Intact, no ulcerations or rash noted.  Psych: Good eye contact, normal affect. Memory intact not anxious or depressed appearing.  CNS: CN 2-12 intact, power,  normal throughout.no focal deficits noted.   Assessment & Plan  HTN (hypertension) Controlled, no  change in medication DASH diet and commitment to daily physical activity for a minimum of 30 minutes discussed and encouraged, as a part of hypertension management. The importance of attaining a healthy weight is also discussed.     10/15/2024    8:59 AM 10/15/2024    8:38 AM 09/02/2024   11:22 AM 08/06/2024   11:16 AM 07/16/2024   11:29 AM 07/11/2024    8:56 AM 07/08/2024    9:11 AM  BP/Weight  Systolic BP 120 146 150 159 122 120 146  Diastolic BP 70 79 82 84 65 82 78  Wt. (Lbs)  154 155 154 152 152.38 150  BMI  25.63 kg/m2 25.79 kg/m2 25.63 kg/m2 25.29 kg/m2 25.36 kg/m2 24.96 kg/m2       Chronic pain of left elbow Current flare , short course oral prednisone  in 2 weeks  Hyperlipidemia LDL goal <100 Hyperlipidemia:Low fat diet discussed and encouraged.   Lipid Panel  Lab Results  Component Value Date   CHOL 112 01/29/2024   HDL 47 01/29/2024   LDLCALC 51 01/29/2024   TRIG 64 01/29/2024   CHOLHDL 2.4 01/29/2024     Updated lab needed at/ before next visit.   Migraine Controlled, no change in medication   Moderate persistent asthma, uncomplicated Controlled, no change in medication   IGT (impaired glucose tolerance) Patient educated about the importance of limiting  Carbohydrate intake , the need to commit to daily physical activity for a minimum of 30 minutes , and to commit weight  loss. The fact that changes in all these areas will reduce or eliminate all together the development of diabetes is stressed.      Latest Ref Rng & Units 05/16/2024    9:17 AM 01/29/2024   10:01 AM 08/07/2023    9:47 AM 01/26/2023   10:34 AM 07/26/2022    8:42 AM  Diabetic Labs  HbA1c 4.8 - 5.6 % 6.2  6.7  6.2  6.1  6.1   Chol 100 - 199 mg/dL  887  740  764    HDL >60 mg/dL  47  49  51    Calc LDL 0 - 99 mg/dL  51  834  834    Triglycerides 0 - 149 mg/dL  64  755  891    Creatinine 0.57 - 1.00 mg/dL 9.04  9.16  9.20  9.09  0.80       10/15/2024    8:59 AM 10/15/2024    8:38 AM  09/02/2024   11:22 AM 08/06/2024   11:16 AM 07/16/2024   11:29 AM 07/11/2024    8:56 AM 07/08/2024    9:11 AM  BP/Weight  Systolic BP 120 146 150 159 122 120 146  Diastolic BP 70 79 82 84 65 82 78  Wt. (Lbs)  154 155 154 152 152.38 150  BMI  25.63 kg/m2 25.79 kg/m2 25.63 kg/m2 25.29 kg/m2 25.36 kg/m2 24.96 kg/m2       No data to display           Updated lab needed at/ before next visit.   Influenza vaccination administered at current visit After obtaining informed consent, the vaccine is  administered , with no adverse effect noted at the time of administration.

## 2024-10-15 NOTE — Assessment & Plan Note (Signed)
 Current flare , short course oral prednisone  in 2 weeks

## 2024-10-15 NOTE — Assessment & Plan Note (Signed)
 Patient educated about the importance of limiting  Carbohydrate intake , the need to commit to daily physical activity for a minimum of 30 minutes , and to commit weight loss. The fact that changes in all these areas will reduce or eliminate all together the development of diabetes is stressed.      Latest Ref Rng & Units 05/16/2024    9:17 AM 01/29/2024   10:01 AM 08/07/2023    9:47 AM 01/26/2023   10:34 AM 07/26/2022    8:42 AM  Diabetic Labs  HbA1c 4.8 - 5.6 % 6.2  6.7  6.2  6.1  6.1   Chol 100 - 199 mg/dL  887  740  764    HDL >60 mg/dL  47  49  51    Calc LDL 0 - 99 mg/dL  51  834  834    Triglycerides 0 - 149 mg/dL  64  755  891    Creatinine 0.57 - 1.00 mg/dL 9.04  9.16  9.20  9.09  0.80       10/15/2024    8:59 AM 10/15/2024    8:38 AM 09/02/2024   11:22 AM 08/06/2024   11:16 AM 07/16/2024   11:29 AM 07/11/2024    8:56 AM 07/08/2024    9:11 AM  BP/Weight  Systolic BP 120 146 150 159 122 120 146  Diastolic BP 70 79 82 84 65 82 78  Wt. (Lbs)  154 155 154 152 152.38 150  BMI  25.63 kg/m2 25.79 kg/m2 25.63 kg/m2 25.29 kg/m2 25.36 kg/m2 24.96 kg/m2       No data to display           Updated lab needed at/ before next visit.

## 2024-10-15 NOTE — Assessment & Plan Note (Signed)
 Hyperlipidemia:Low fat diet discussed and encouraged.   Lipid Panel  Lab Results  Component Value Date   CHOL 112 01/29/2024   HDL 47 01/29/2024   LDLCALC 51 01/29/2024   TRIG 64 01/29/2024   CHOLHDL 2.4 01/29/2024     Updated lab needed at/ before next visit.

## 2024-10-15 NOTE — Assessment & Plan Note (Signed)
 Controlled, no change in medication

## 2024-10-15 NOTE — Patient Instructions (Addendum)
 Follow-up in 7 months, call if you need me sooner.Hep B #3 at that visit  Flu vaccine today.  Nurse visit for hepatitis B #1 in 4 weeks.  Nurse visit for hepatitis B #2 in 12 weeks.  Please get fasting labs already ordered in June and the next 1 to 7 days.  5-day course of prednisone  is prescribed for left elbow pain, do not take this before next 2 weeks.It will not be dispensed before 2 weeks  It is important that you exercise regularly at least 30 minutes 5 times a week. If you develop chest pain, have severe difficulty breathing, or feel very tired, stop exercising immediately and seek medical attention  ' Think about what you will eat, plan ahead. Choose  clean, green, fresh or frozen over canned, processed or packaged foods which are more sugary, salty and fatty. 70 to 75% of food eaten should be vegetables and fruit. Three meals at set times with snacks allowed between meals, but they must be fruit or vegetables. Aim to eat over a 12 hour period , example 7 am to 7 pm, and STOP after  your last meal of the day. Drink water ,generally about 64 ounces per day, no other drink is as healthy. Fruit juice is best enjoyed in a healthy way, by EATING the fruit.  Best for 2025/2026 and happy December Birthday!  Thanks for choosing [facility], we consider it a privelige to serve you.

## 2024-10-15 NOTE — Assessment & Plan Note (Signed)
 After obtaining informed consent, the vaccine is  administered , with no adverse effect noted at the time of administration.

## 2024-10-16 DIAGNOSIS — E785 Hyperlipidemia, unspecified: Secondary | ICD-10-CM | POA: Diagnosis not present

## 2024-10-16 DIAGNOSIS — I10 Essential (primary) hypertension: Secondary | ICD-10-CM | POA: Diagnosis not present

## 2024-10-16 DIAGNOSIS — R7302 Impaired glucose tolerance (oral): Secondary | ICD-10-CM | POA: Diagnosis not present

## 2024-10-17 ENCOUNTER — Other Ambulatory Visit: Payer: Self-pay

## 2024-10-17 DIAGNOSIS — I10 Essential (primary) hypertension: Secondary | ICD-10-CM

## 2024-10-17 DIAGNOSIS — E559 Vitamin D deficiency, unspecified: Secondary | ICD-10-CM

## 2024-10-17 DIAGNOSIS — R7302 Impaired glucose tolerance (oral): Secondary | ICD-10-CM

## 2024-10-17 DIAGNOSIS — E785 Hyperlipidemia, unspecified: Secondary | ICD-10-CM

## 2024-10-17 LAB — CMP14+EGFR
ALT: 14 IU/L (ref 0–32)
AST: 20 IU/L (ref 0–40)
Albumin: 4.5 g/dL (ref 3.8–4.9)
Alkaline Phosphatase: 78 IU/L (ref 49–135)
BUN/Creatinine Ratio: 14 (ref 9–23)
BUN: 13 mg/dL (ref 6–24)
Bilirubin Total: 0.5 mg/dL (ref 0.0–1.2)
CO2: 25 mmol/L (ref 20–29)
Calcium: 9.5 mg/dL (ref 8.7–10.2)
Chloride: 103 mmol/L (ref 96–106)
Creatinine, Ser: 0.91 mg/dL (ref 0.57–1.00)
Globulin, Total: 2.7 g/dL (ref 1.5–4.5)
Glucose: 88 mg/dL (ref 70–99)
Potassium: 4.1 mmol/L (ref 3.5–5.2)
Sodium: 140 mmol/L (ref 134–144)
Total Protein: 7.2 g/dL (ref 6.0–8.5)
eGFR: 73 mL/min/1.73 (ref >59)

## 2024-10-17 LAB — HEMOGLOBIN A1C
Est. average glucose Bld gHb Est-mCnc: 123 mg/dL
Hgb A1c MFr Bld: 5.9 % — ABNORMAL HIGH (ref 4.8–5.6)

## 2024-10-17 LAB — LIPID PANEL
Chol/HDL Ratio: 5.2 ratio — ABNORMAL HIGH (ref 0.0–4.4)
Cholesterol, Total: 258 mg/dL — ABNORMAL HIGH (ref 100–199)
HDL: 50 mg/dL (ref >39)
LDL Chol Calc (NIH): 182 mg/dL — ABNORMAL HIGH (ref 0–99)
Triglycerides: 143 mg/dL (ref 0–149)
VLDL Cholesterol Cal: 26 mg/dL (ref 5–40)

## 2024-10-22 ENCOUNTER — Ambulatory Visit: Admitting: Allergy & Immunology

## 2024-10-22 ENCOUNTER — Encounter: Payer: Self-pay | Admitting: Allergy & Immunology

## 2024-10-22 ENCOUNTER — Ambulatory Visit

## 2024-10-22 ENCOUNTER — Other Ambulatory Visit: Payer: Self-pay

## 2024-10-22 VITALS — BP 118/78 | HR 90 | Temp 97.9°F | Resp 18 | Wt 152.6 lb

## 2024-10-22 DIAGNOSIS — T7800XA Anaphylactic reaction due to unspecified food, initial encounter: Secondary | ICD-10-CM

## 2024-10-22 DIAGNOSIS — J3089 Other allergic rhinitis: Secondary | ICD-10-CM | POA: Diagnosis not present

## 2024-10-22 DIAGNOSIS — J339 Nasal polyp, unspecified: Secondary | ICD-10-CM | POA: Diagnosis not present

## 2024-10-22 DIAGNOSIS — T7800XD Anaphylactic reaction due to unspecified food, subsequent encounter: Secondary | ICD-10-CM

## 2024-10-22 DIAGNOSIS — J4550 Severe persistent asthma, uncomplicated: Secondary | ICD-10-CM | POA: Diagnosis not present

## 2024-10-22 MED ORDER — LEVOCETIRIZINE DIHYDROCHLORIDE 5 MG PO TABS: 5.0000 mg | ORAL_TABLET | Freq: Every evening | ORAL | 1 refills | Status: AC

## 2024-10-22 MED ORDER — MONTELUKAST SODIUM 10 MG PO TABS
10.0000 mg | ORAL_TABLET | Freq: Every day | ORAL | 1 refills | Status: AC
Start: 2024-10-22 — End: ?

## 2024-10-22 NOTE — Progress Notes (Signed)
 FOLLOW UP  Date of Service/Encounter:  10/22/24   Assessment:   Moderate persistent asthma - doing well on Tezspire    Perennial allergic rhinitis   Nasal polyposis - s/p polypectomy around 2016 (has seen Dr. Karis in the past)   Adverse food reaction (pineapple)   Systolic ejection murmur over aorta - normal echocardiogram in December 2023  Plan/Recommendations:   1. Moderate persistent asthma, uncomplicated - Lung testing looks stable today. - We are not going to make any changes.  - Daily controller medication(s): Breztri  two puffs twice daily with spacer + Singulair  10mg  daily + Tezspire  monthly  - Rescue medications: albuterol  2 puffs every 4-6 hours as needed - Asthma control goals:  * Full participation in all desired activities (may need albuterol  before activity) * Albuterol  use two time or less a week on average (not counting use with activity) * Cough interfering with sleep two time or less a month * Oral steroids no more than once a year * No hospitalizations  2. Perennial allergic rhinitis  - Continue with Flonase  Sensimist two puffs once daily as needed for stuffy nose.   - Continue with nasal saline rinses 1-2 times daily.  - Continue with Singulair  10mg  daily. - Continue Zyrtec  10 mg and Xyzal  5mg  at night.  - Let's SCHEDULE you for allergy testing which we can do before the end of the year.   3.  Food allergy Continue to avoid pineapple.   In case of an allergic reaction, take Zyrtec  20mg  every 6 hours, and if life-threatening symptoms occur, inject with EpiPen  0.3 mg.  4.  Epistaxis - Pinch both nostrils while leaning forward for at least 5 minutes before checking to see if the bleeding has stopped.  - If bleeding is not controlled within 5-10 minutes apply a cotton ball soaked with oxymetazoline  (Afrin) to the bleeding nostril for a few seconds.  - If the problem persists or worsens a referral to ENT for further evaluation may be necessary.    5.Dysphagia Continue to with Nexium  daily.  6. Nasal polyposis Continue Flonase  Sensimist as tolerated We could consider changing your biologic to Dupixent for control of nasal polyposis.  7. Return in about 3 months (around 10/11/2024) since your breathing test was a bit lower today. You can have the follow up appointment with Dr. Iva or a Nurse Practicioner (our Nurse Practitioners are excellent and always have Physician oversight!).   Subjective:   Hannah Hays is a 58 y.o. female presenting today for follow up of  Chief Complaint  Patient presents with   Follow-up    Patient presents to the office for a 3 month follow up. Patient has no concerns today.    Hannah Hays has a history of the following: Patient Active Problem List   Diagnosis Date Noted   Influenza vaccination administered at current visit 10/15/2024   Neck mass 06/24/2024   Allergy with anaphylaxis due to food 10/05/2023   Eosinophilic esophagitis 10/05/2023   Chronic pain of left elbow 09/07/2022   Posterior right knee pain 12/16/2020   Overweight (BMI 25.0-29.9) 06/30/2020   Insomnia 08/07/2018   Hyperlipidemia LDL goal <100 10/16/2017   Perennial allergic rhinitis 08/28/2017   Moderate persistent asthma, uncomplicated 08/28/2017   Hx of adenomatous colonic polyps    Lipoma 12/26/2015   Liver mass, left lobe 02/11/2015   Nasal polyposis 08/20/2014   Chronic nonallergic rhinitis 08/20/2014   Vitamin D  deficiency 07/12/2012   HTN (hypertension) 08/19/2011  GERD (gastroesophageal reflux disease) 04/11/2011   IBS 12/19/2010   Migraine 01/19/2010   IGT (impaired glucose tolerance) 12/26/2009   Asthma 11/15/2007    History obtained from: chart review and patient.  Discussed the use of AI scribe software for clinical note transcription with the patient and/or guardian, who gave verbal consent to proceed.  Hannah Hays is a 58 y.o. female presenting for a follow up visit.  She was last seen in  August 2025.  At that time, lung testing looked a bit worse, but we do not worry about it.  She was sick before the last visit which we thought might be related to why it was lower.  We continue with Breztri  2 puffs twice daily as well as Singulair  test prior.  She also remained on albuterol  as needed.  For her rhinitis, she remains on Flonase  as well as Singulair  and Zyrtec .  She also has Xyzal .  She continue to avoid pineapple.  EpiPen  is up-to-date.  Her nasal polyps are under good control with Flonase .  Since last visit, she has done very well. She just got back from a trip to [place].  Asthma/Respiratory Symptom History: Her current medications include Breztri  and montelukast  as well as the Tezspire . She has not used her emergency inhaler in over a month. She primarily uses Breztri  and is on Tezspire  for her nasal polyps. She does not use her nasal spray daily due to discomfort. No recent sinus infections, with the last one occurring over a year ago. She confirms good nighttime breathing.  Allergic Rhinitis Symptom History: She has done well with her nasal congestion. She is not using her nose spray daily because she feels that it burns her nose. She is taking montelukast  and sometimes uses Xyzal  in addition to Zyrtec  for her allergies.  Food Allergy Symptom History: She continues to avoid pineapple  GERD Symptom History: She is on Protonix  twice daily. Sometimes she will just use it once daily or only as needed.   She remains on the amlodipine  and spironolactone . Her blood pressure was good today. She has dual insurance coverage through [employer].   She has a history of a heart murmur, which has been stable over the years. An echocardiogram in December showed normal mitral and tricuspid valves. She previously experienced numbness in her arm, which has resolved.  She had a cyst removed from her back, which still causes some discomfort but is improving. She avoids pineapple due to a  mild allergic reaction that causes oral swelling if consumed in large amounts.  Otherwise, there have been no changes to her past medical history, surgical history, family history, or social history.    Review of systems otherwise negative other than that mentioned in the HPI.    Objective:   Blood pressure 118/78, pulse 90, temperature 97.9 F (36.6 C), temperature source Temporal, resp. rate 18, weight 152 lb 9.6 oz (69.2 kg), SpO2 96%. Body mass index is 25.39 kg/m.    Physical Exam Vitals reviewed.  Constitutional:      Appearance: She is well-developed.     Comments: Pleasant. Talkative. Smiling.   HENT:     Head: Normocephalic and atraumatic.     Right Ear: Tympanic membrane, ear canal and external ear normal.     Left Ear: Tympanic membrane, ear canal and external ear normal.     Nose: No nasal deformity, septal deviation, mucosal edema or rhinorrhea.     Right Turbinates: Enlarged, swollen and pale.  Left Turbinates: Enlarged, swollen and pale.     Right Sinus: No maxillary sinus tenderness or frontal sinus tenderness.     Left Sinus: No maxillary sinus tenderness or frontal sinus tenderness.     Comments: No polyps.    Mouth/Throat:     Lips: Pink.     Mouth: Mucous membranes are moist. Mucous membranes are not pale and not dry.     Pharynx: Uvula midline.     Comments: Mild cobblestoning. Eyes:     General: Lids are normal. No allergic shiner.       Right eye: No discharge.        Left eye: No discharge.     Conjunctiva/sclera: Conjunctivae normal.     Right eye: Right conjunctiva is not injected. No chemosis.    Left eye: Left conjunctiva is not injected. No chemosis.    Pupils: Pupils are equal, round, and reactive to light.  Cardiovascular:     Rate and Rhythm: Normal rate and regular rhythm.     Heart sounds: Normal heart sounds.  Pulmonary:     Effort: Pulmonary effort is normal. No tachypnea, accessory muscle usage or respiratory distress.      Breath sounds: Normal breath sounds. No wheezing, rhonchi or rales.     Comments: Decreased air movement at the bases.  No increased work of breathing. Chest:     Chest wall: No tenderness.  Lymphadenopathy:     Cervical: No cervical adenopathy.  Skin:    General: Skin is warm.     Capillary Refill: Capillary refill takes less than 2 seconds.     Coloration: Skin is not pale.     Findings: No abrasion, erythema, petechiae or rash. Rash is not papular, urticarial or vesicular.  Neurological:     Mental Status: She is alert.  Psychiatric:        Behavior: Behavior is cooperative.      Diagnostic studies:    Spirometry: results normal (FEV1: 2.01/89%, FVC: 2.63/93%, FEV1/FVC: 76%).    Spirometry consistent with normal pattern.    Allergy Studies: none       Marty Shaggy, MD  [facility] 

## 2024-10-22 NOTE — Patient Instructions (Addendum)
 1. Moderate persistent asthma, uncomplicated - Lung testing looks stable today. - We are not going to make any changes.  - Daily controller medication(s): Breztri  two puffs twice daily with spacer + Singulair  10mg  daily + Tezspire  monthly  - Rescue medications: albuterol  2 puffs every 4-6 hours as needed - Asthma control goals:  * Full participation in all desired activities (may need albuterol  before activity) * Albuterol  use two time or less a week on average (not counting use with activity) * Cough interfering with sleep two time or less a month * Oral steroids no more than once a year * No hospitalizations  2. Perennial allergic rhinitis  - Continue with Flonase  Sensimist two puffs once daily as needed for stuffy nose.   - Continue with nasal saline rinses 1-2 times daily.  - Continue with Singulair  10mg  daily. - Continue Zyrtec  10 mg and Xyzal  5mg  at night.  - Let's SCHEDULE you for allergy testing which we can do before the end of the year.   3.  Food allergy Continue to avoid pineapple.   In case of an allergic reaction, take Zyrtec  20mg  every 6 hours, and if life-threatening symptoms occur, inject with EpiPen  0.3 mg.  4.  Epistaxis - Pinch both nostrils while leaning forward for at least 5 minutes before checking to see if the bleeding has stopped.  - If bleeding is not controlled within 5-10 minutes apply a cotton ball soaked with oxymetazoline  (Afrin) to the bleeding nostril for a few seconds.  - If the problem persists or worsens a referral to ENT for further evaluation may be necessary.   5.Dysphagia Continue to with Nexium  daily.  6. Nasal polyposis Continue Flonase  Sensimist as tolerated We could consider changing your biologic to Dupixent for control of nasal polyposis.  7. Return in about 3 months (around 10/11/2024) since your breathing test was a bit lower today. You can have the follow up appointment with Dr. Iva or a Nurse Practicioner (our Nurse  Practitioners are excellent and always have Physician oversight!).    Please inform us  of any Emergency Department visits, hospitalizations, or changes in symptoms. Call us  before going to the ED for breathing or allergy symptoms since we might be able to fit you in for a sick visit. Feel free to contact us  anytime with any questions, problems, or concerns.  It was a pleasure to see you again today!  Websites that have reliable patient information: 1. American Academy of Asthma, Allergy, and Immunology: www.aaaai.org 2. Food Allergy Research and Education (FARE): foodallergy.org 3. Mothers of Asthmatics: http://www.asthmacommunitynetwork.org 4. American College of Allergy, Asthma, and Immunology: www.acaai.org      "Like" us  on Facebook and Instagram for our latest updates!      A healthy democracy works best when [employer] participate! Make sure you are registered to vote! If you have moved or changed any of your contact information, you will need to get this updated before voting! Scan the QR codes below to learn more!

## 2024-11-05 ENCOUNTER — Encounter: Payer: Self-pay | Admitting: Allergy & Immunology

## 2024-11-05 ENCOUNTER — Ambulatory Visit (INDEPENDENT_AMBULATORY_CARE_PROVIDER_SITE_OTHER): Admitting: Allergy & Immunology

## 2024-11-05 DIAGNOSIS — T7800XA Anaphylactic reaction due to unspecified food, initial encounter: Secondary | ICD-10-CM

## 2024-11-05 DIAGNOSIS — T7800XD Anaphylactic reaction due to unspecified food, subsequent encounter: Secondary | ICD-10-CM

## 2024-11-05 DIAGNOSIS — J4550 Severe persistent asthma, uncomplicated: Secondary | ICD-10-CM

## 2024-11-05 DIAGNOSIS — J302 Other seasonal allergic rhinitis: Secondary | ICD-10-CM

## 2024-11-05 DIAGNOSIS — J3089 Other allergic rhinitis: Secondary | ICD-10-CM

## 2024-11-05 DIAGNOSIS — J339 Nasal polyp, unspecified: Secondary | ICD-10-CM

## 2024-11-05 NOTE — Patient Instructions (Addendum)
 1. Moderate persistent asthma, uncomplicated - Lung testing not done today. - I hope that the cough improves.  - We are not going to make any changes.  - Daily controller medication(s): Breztri  two puffs twice daily with spacer + Singulair  10mg  daily + Tezspire  monthly  - Rescue medications: albuterol  2 puffs every 4-6 hours as needed - Asthma control goals:  * Full participation in all desired activities (may need albuterol  before activity) * Albuterol  use two time or less a week on average (not counting use with activity) * Cough interfering with sleep two time or less a month * Oral steroids no more than once a year * No hospitalizations  2. Perennial allergic rhinitis  - Continue with Flonase  Sensimist two puffs once daily as needed for stuffy nose.   - Continue with nasal saline rinses 1-2 times daily.  - Continue with Singulair  10mg  daily. - Continue Zyrtec  10 mg and Xyzal  5mg  at night.  - Testing today showed: grasses, weeds, trees, indoor molds, and dust mites - Copy of test results provided.  - Avoidance measures provided. - Avoidance measures might help you to decrease your medications, but you can also consider allergy shots.  - Allergy shots re-train and reset the immune system to ignore environmental allergens and decrease the resulting immune response to those allergens (sneezing, itchy watery eyes, runny nose, nasal congestion, etc).    - Allergy shots improve symptoms in 75-85% of patients.  - We can discuss more at the next appointment if the medications are not working for you.  3.  Food allergy - Continue to avoid pineapple.   - Testing was slightly reactive today.  - In case of an allergic reaction, take Zyrtec  20mg  every 6 hours, and if life-threatening symptoms occur, inject with EpiPen  0.3 mg.  4.  Epistaxis - Pinch both nostrils while leaning forward for at least 5 minutes before checking to see if the bleeding has stopped.  - If bleeding is not controlled  within 5-10 minutes apply a cotton ball soaked with oxymetazoline  (Afrin) to the bleeding nostril for a few seconds.  - If the problem persists or worsens a referral to ENT for further evaluation may be necessary.   5.Dysphagia Continue to with Nexium  daily.  6. Nasal polyposis - Continue Flonase  Sensimist as tolerated - We could consider changing your biologic to Dupixent for control of nasal polyposis.  7. Return in about 3 months (around 02/03/2025). You can have the follow up appointment with Dr. Iva or a Nurse Practicioner (our Nurse Practitioners are excellent and always have Physician oversight!).    Please inform us  of any Emergency Department visits, hospitalizations, or changes in symptoms. Call us  before going to the ED for breathing or allergy symptoms since we might be able to fit you in for a sick visit. Feel free to contact us  anytime with any questions, problems, or concerns.  It was a pleasure to see you again today!  Websites that have reliable patient information: 1. American Academy of Asthma, Allergy, and Immunology: www.aaaai.org 2. Food Allergy Research and Education (FARE): foodallergy.org 3. Mothers of Asthmatics: http://www.asthmacommunitynetwork.org 4. American College of Allergy, Asthma, and Immunology: www.acaai.org      "Like" us  on Facebook and Instagram for our latest updates!      A healthy democracy works best when [employer] participate! Make sure you are registered to vote! If you have moved or changed any of your contact information, you will need to get this updated before voting!  Scan the QR codes below to learn more!           Airborne Adult Perc - 11/05/24 0843     Time Antigen Placed 0844    Allergen Manufacturer Jestine    Location Back    Number of Test 55    1. Control-Buffer 50% Glycerol Negative    2. Control-Histamine 3+    3. Bahia Negative    4. Bermuda Negative    5. Johnson Negative    6. [place]  Blue Negative     7. Meadow Fescue 2+    8. Perennial Rye Negative    9. Timothy Negative    10. Ragweed Mix Negative    11. Cocklebur Negative    12. Plantain,  English Negative    13. Baccharis Negative    14. Dog Fennel Negative    15. Russian Thistle Negative    16. Lamb's Quarters 2+    17. Sheep Sorrell 2+    18. Rough Pigweed Negative    19. Marsh Elder, Rough Negative    20. Mugwort, Common Negative    21. Box, Elder Negative    22. Cedar, red Negative    23. Sweet Gum Negative    24. Pecan Pollen Negative    25. Pine Mix Negative    26. Walnut, Black Pollen Negative    27. Red Mulberry Negative    28. Ash Mix --   +/-   29. Birch Mix Negative    30. Beech American Negative    31. Cottonwood, Eastern Negative    32. Hickory, White Negative    33. Maple Mix Negative    34. Oak, Eastern Mix Negative    35. Sycamore Eastern Negative    36. Alternaria Alternata Negative    37. Cladosporium Herbarum Negative    38. Aspergillus Mix Negative    39. Penicillium Mix Negative    40. Bipolaris Sorokiniana (Helminthosporium) Negative    41. Drechslera Spicifera (Curvularia) Negative    42. Mucor Plumbeus Negative    43. Fusarium Moniliforme Negative    44. Aureobasidium Pullulans (pullulara) Negative    45. Rhizopus Oryzae Negative    46. Botrytis Cinera 2+    47. Epicoccum Nigrum Negative    48. Phoma Betae Negative    49. Dust Mite Mix 2+    50. Cat Hair 10,000 BAU/ml Negative    51.  Dog Epithelia Negative    52. Mixed Feathers Negative    53. Horse Epithelia Negative    54. Cockroach, German Negative    55. Tobacco Leaf Negative          Intradermal - 11/05/24 0914     Time Antigen Placed 0915    Allergen Manufacturer Jestine    Location Arm    Number of Test 13    Control Negative    Bahia Negative    Bermuda Negative    Johnson Negative    Ragweed Mix Negative    Tree Mix Negative    Mold 1 Negative    Mold 2 Negative    Mold 3 Negative    Mold 4 3+    Cat  Negative    Dog Negative    Cockroach Negative          Food Adult Perc - 11/05/24 0800     Time Antigen Placed 9156    Allergen Manufacturer Jestine    Location Back    Number of allergen test 1    65.  Pineapple --   3 x 5         Reducing Pollen Exposure  The American Academy of Allergy, Asthma and Immunology suggests the following steps to reduce your exposure to pollen during allergy seasons.    Do not hang sheets or clothing out to dry; pollen may collect on these items. Do not mow lawns or spend time around freshly cut grass; mowing stirs up pollen. Keep windows closed at night.  Keep car windows closed while driving. Minimize morning activities outdoors, a time when pollen counts are usually at their highest. Stay indoors as much as possible when pollen counts or humidity is high and on windy days when pollen tends to remain in the air longer. Use air conditioning when possible.  Many air conditioners have filters that trap the pollen spores. Use a HEPA room air filter to remove pollen form the indoor air you breathe.  Control of Mold Allergen   Mold and fungi can grow on a variety of surfaces provided certain temperature and moisture conditions exist.  Outdoor molds grow on plants, decaying vegetation and soil.  The major outdoor mold, Alternaria and Cladosporium, are found in very high numbers during hot and dry conditions.  Generally, a late Summer - Fall peak is seen for common outdoor fungal spores.  Rain will temporarily lower outdoor mold spore count, but counts rise rapidly when the rainy period ends.  The most important indoor molds are Aspergillus and Penicillium.  Dark, humid and poorly ventilated basements are ideal sites for mold growth.  The next most common sites of mold growth are the bathroom and the kitchen.   Indoor (Perennial) Mold Control   Positive indoor molds via skin testing: Fusarium, Aureobasidium (Pullulara), and Rhizopus  Maintain humidity  below 50%. Clean washable surfaces with 5% bleach solution. Remove sources e.g. contaminated carpets.    Control of Dust Mite Allergen    Dust mites play a major role in allergic asthma and rhinitis.  They occur in environments with high humidity wherever human skin is found.  Dust mites absorb humidity from the atmosphere (ie, they do not drink) and feed on organic matter (including shed human and animal skin).  Dust mites are a microscopic type of insect that you cannot see with the naked eye.  High levels of dust mites have been detected from mattresses, pillows, carpets, upholstered furniture, bed covers, clothes, soft toys and any woven material.  The principal allergen of the dust mite is found in its feces.  A gram of dust may contain 1,000 mites and 250,000 fecal particles.  Mite antigen is easily measured in the air during house cleaning activities.  Dust mites do not bite and do not cause harm to humans, other than by triggering allergies/asthma.    Ways to decrease your exposure to dust mites in your home:  Encase mattresses, box springs and pillows with a mite-impermeable barrier or cover   Wash sheets, blankets and drapes weekly in hot water  (130 F) with detergent and dry them in a dryer on the hot setting.  Have the room cleaned frequently with a vacuum cleaner and a damp dust-mop.  For carpeting or rugs, vacuuming with a vacuum cleaner equipped with a high-efficiency particulate air (HEPA) filter.  The dust mite allergic individual should not be in a room which is being cleaned and should wait 1 hour after cleaning before going into the room. Do not sleep on upholstered furniture (eg, couches).   If possible removing carpeting,  upholstered furniture and drapery from the home is ideal.  Horizontal blinds should be eliminated in the rooms where the person spends the most time (bedroom, study, television room).  Washable vinyl, roller-type shades are optimal. Remove all non-washable  stuffed toys from the bedroom.  Wash stuffed toys weekly like sheets and blankets above.   Reduce indoor humidity to less than 50%.  Inexpensive humidity monitors can be purchased at most hardware stores.  Do not use a humidifier as can make the problem worse and are not recommended.  Allergy Shots  Allergies are the result of a chain reaction that starts in the immune system. Your immune system controls how your body defends itself. For instance, if you have an allergy to pollen, your immune system identifies pollen as an invader or allergen. Your immune system overreacts by producing antibodies called Immunoglobulin E (IgE). These antibodies travel to cells that release chemicals, causing an allergic reaction.  The concept behind allergy immunotherapy, whether it is received in the form of shots or tablets, is that the immune system can be desensitized to specific allergens that trigger allergy symptoms. Although it requires time and patience, the payback can be long-term relief. Allergy injections contain a dilute solution of those substances that you are allergic to based upon your skin testing and allergy history.   How Do Allergy Shots Work?  Allergy shots work much like a vaccine. Your body responds to injected amounts of a particular allergen given in increasing doses, eventually developing a resistance and tolerance to it. Allergy shots can lead to decreased, minimal or no allergy symptoms.  There generally are two phases: build-up and maintenance. Build-up often ranges from three to six months and involves receiving injections with increasing amounts of the allergens. The shots are typically given once or twice a week, though more rapid build-up schedules are sometimes used.  The maintenance phase begins when the most effective dose is reached. This dose is different for each person, depending on how allergic you are and your response to the build-up injections. Once the maintenance dose is  reached, there are longer periods between injections, typically two to four weeks.  Occasionally doctors give cortisone-type shots that can temporarily reduce allergy symptoms. These types of shots are different and should not be confused with allergy immunotherapy shots.  Who Can Be Treated with Allergy Shots?  Allergy shots may be a good treatment approach for people with allergic rhinitis (hay fever), allergic asthma, conjunctivitis (eye allergy) or stinging insect allergy.   Before deciding to begin allergy shots, you should consider:   The length of allergy season and the severity of your symptoms  Whether medications and/or changes to your environment can control your symptoms  Your desire to avoid long-term medication use  Time: allergy immunotherapy requires a major time commitment  Cost: may vary depending on your insurance coverage  Allergy shots for children age 37 and older are effective and often well tolerated. They might prevent the onset of new allergen sensitivities or the progression to asthma.  Allergy shots are not started on patients who are pregnant but can be continued on patients who become pregnant while receiving them. In some patients with other medical conditions or who take certain common medications, allergy shots may be of risk. It is important to mention other medications you talk to your allergist.   What are the two types of build-ups offered:   RUSH or Rapid Desensitization -- one day of injections lasting from 8:30-4:30pm, injections every  1 hour.  Approximately half of the build-up process is completed in that one day.  The following week, normal build-up is resumed, and this entails ~16 visits either weekly or twice weekly, until reaching your "maintenance dose" which is continued weekly until eventually getting spaced out to every month for a duration of 3 to 5 years. The regular build-up appointments are nurse visits where the injections are  administered, followed by required monitoring for 30 minutes.    Traditional build-up -- weekly visits for 6 -12 months until reaching "maintenance dose", then continue weekly until eventually spacing out to every 4 weeks as above. At these appointments, the injections are administered, followed by required monitoring for 30 minutes.     Either way is acceptable, and both are equally effective. With the rush protocol, the advantage is that less time is spent here for injections overall AND you would also reach maintenance dosing faster (which is when the clinical benefit starts to become more apparent). Not everyone is a candidate for rapid desensitization.   IF we proceed with the RUSH protocol, there are premedications which must be taken the day before and the day after the rush only (this includes antihistamines, steroids, and Singulair ).  After the rush day, no prednisone  or Singulair  is required, and we just recommend antihistamines taken on your injection day.  What Is An Estimate of the Costs?  If you are interested in starting allergy injections, please check with your insurance company about your coverage for both allergy vial sets and allergy injections.  Please do so prior to making the appointment to start injections.  The following are CPT codes to give to your insurance company. These are the amounts we BILL to the insurance company, but the amount YOU WILL PAY and WE RECEIVE IS SUBSTANTIALLY LESS and depends on the contracts we have with different insurance companies.   Amount Billed to Insurance One allergy vial set  CPT 95165   $ 1200     Two allergy vial set  CPT 95165   $ 2400     Three allergy vial set  CPT 95165   $ 3600     One injection   CPT 95115   $ 35  Two injections   CPT 95117   $ 40 RUSH (Rapid Desensitization) CPT 95180 x 8 hours $500/hour  Regarding the allergy injections, your co-pay may or may not apply with each injection, so please confirm this with your  insurance company. When you start allergy injections, 1 or 2 sets of vials are made based on your allergies.  Not all patients can be on one set of vials. A set of vials lasts 6 months to a year depending on how quickly you can proceed with your build-up of your allergy injections. Vials are personalized for each patient depending on their specific allergens.  How often are allergy injection given during the build-up period?   Injections are given at least weekly during the build-up period until your maintenance dose is achieved. Per the doctor's discretion, you may have the option of getting allergy injections two times per week during the build-up period. However, there must be at least 48 hours between injections. The build-up period is usually completed within 6-12 months depending on your ability to schedule injections and for adjustments for reactions. When maintenance dose is reached, your injection schedule is gradually changed to every two weeks and later to every three weeks. Injections will then continue every 4 weeks. Usually, injections  are continued for a total of 3-5 years.   When Will I Feel Better?  Some may experience decreased allergy symptoms during the build-up phase. For others, it may take as long as 12 months on the maintenance dose. If there is no improvement after a year of maintenance, your allergist will discuss other treatment options with you.  If you aren't responding to allergy shots, it may be because there is not enough dose of the allergen in your vaccine or there are missing allergens that were not identified during your allergy testing. Other reasons could be that there are high levels of the allergen in your environment or major exposure to non-allergic triggers like tobacco smoke.  What Is the Length of Treatment?  Once the maintenance dose is reached, allergy shots are generally continued for three to five years. The decision to stop should be discussed with your  allergist at that time. Some people may experience a permanent reduction of allergy symptoms. Others may relapse and a longer course of allergy shots can be considered.  What Are the Possible Reactions?  The two types of adverse reactions that can occur with allergy shots are local and systemic. Common local reactions include very mild redness and swelling at the injection site, which can happen immediately or several hours after. Report a delayed reaction from your last injection. These include arm swelling or runny nose, watery eyes or cough that occurs within 12-24 hours after injection. A systemic reaction, which is less common, affects the entire body or a particular body system. They are usually mild and typically respond quickly to medications. Signs include increased allergy symptoms such as sneezing, a stuffy nose or hives.   Rarely, a serious systemic reaction called anaphylaxis can develop. Symptoms include swelling in the throat, wheezing, a feeling of tightness in the chest, nausea or dizziness. Most serious systemic reactions develop within 30 minutes of allergy shots. This is why it is strongly recommended you wait in your doctor's office for 30 minutes after your injections. Your allergist is trained to watch for reactions, and his or her staff is trained and equipped with the proper medications to identify and treat them.   Report to the nurse immediately if you experience any of the following symptoms: swelling, itching or redness of the skin, hives, watery eyes/nose, breathing difficulty, excessive sneezing, coughing, stomach pain, diarrhea, or light headedness. These symptoms may occur within 15-20 minutes after injection and may require medication.   Who Should Administer Allergy Shots?  The preferred location for receiving shots is your prescribing allergist's office. Injections can sometimes be given at another facility where the physician and staff are trained to recognize and  treat reactions, and have received instructions by your prescribing allergist.  What if I am late for an injection?   Injection dose will be adjusted depending upon how many days or weeks you are late for your injection.   What if I am sick?   Please report any illness to the nurse before receiving injections. She may adjust your dose or postpone injections depending on your symptoms. If you have fever, flu, sinus infection or chest congestion it is best to postpone allergy injections until you are better. Never get an allergy injection if your asthma is causing you problems. If your symptoms persist, seek out medical care to get your health problem under control.  What If I am or Become Pregnant:  Women that become pregnant should schedule an appointment with [facility] before receiving any further allergy injections.

## 2024-11-05 NOTE — Progress Notes (Signed)
 FOLLOW UP  Date of Service/Encounter:  11/05/24   Assessment:   Moderate persistent asthma - doing well on Tezspire    Perennial and seasonal allergic rhinitis (grasses, weeds, trees, indoor molds, and dust mites)   Nasal polyposis - s/p polypectomy around 2016 (has seen Dr. Karis in the past)   Adverse food reaction (pineapple)    Systolic ejection murmur over aorta - normal echocardiogram in December 2023  Plan/Recommendations:   1. Moderate persistent asthma, uncomplicated - Lung testing not done today. - I hope that the cough improves.  - We are not going to make any changes.  - Daily controller medication(s): Breztri  two puffs twice daily with spacer + Singulair  10mg  daily + Tezspire  monthly  - Rescue medications: albuterol  2 puffs every 4-6 hours as needed - Asthma control goals:  * Full participation in all desired activities (may need albuterol  before activity) * Albuterol  use two time or less a week on average (not counting use with activity) * Cough interfering with sleep two time or less a month * Oral steroids no more than once a year * No hospitalizations  2. Perennial allergic rhinitis  - Continue with Flonase  Sensimist two puffs once daily as needed for stuffy nose.   - Continue with nasal saline rinses 1-2 times daily.  - Continue with Singulair  10mg  daily. - Continue Zyrtec  10 mg and Xyzal  5mg  at night.  - Testing today showed: grasses, weeds, trees, indoor molds, and dust mites - Copy of test results provided.  - Avoidance measures provided. - Avoidance measures might help you to decrease your medications, but you can also consider allergy shots.  - Allergy shots re-train and reset the immune system to ignore environmental allergens and decrease the resulting immune response to those allergens (sneezing, itchy watery eyes, runny nose, nasal congestion, etc).    - Allergy shots improve symptoms in 75-85% of patients.  - We can discuss more at the next  appointment if the medications are not working for you.  3.  Food allergy - Continue to avoid pineapple.   - Testing was slightly reactive today.  - In case of an allergic reaction, take Zyrtec  20mg  every 6 hours, and if life-threatening symptoms occur, inject with EpiPen  0.3 mg.  4.  Epistaxis - Pinch both nostrils while leaning forward for at least 5 minutes before checking to see if the bleeding has stopped.  - If bleeding is not controlled within 5-10 minutes apply a cotton ball soaked with oxymetazoline  (Afrin) to the bleeding nostril for a few seconds.  - If the problem persists or worsens a referral to ENT for further evaluation may be necessary.   5.Dysphagia Continue to with Nexium  daily.  6. Nasal polyposis - Continue Flonase  Sensimist as tolerated - We could consider changing your biologic to Dupixent for control of nasal polyposis.  7. Return in about 3 months (around 02/03/2025). You can have the follow up appointment with Dr. Iva or a Nurse Practicioner (our Nurse Practitioners are excellent and always have Physician oversight!).    Subjective:   Hannah Hays is a 58 y.o. female presenting today for follow up of No chief complaint on file.   Hannah Hays has a history of the following: Patient Active Problem List   Diagnosis Date Noted   Influenza vaccination administered at current visit 10/15/2024   Neck mass 06/24/2024   Allergy with anaphylaxis due to food 10/05/2023   Eosinophilic esophagitis 10/05/2023   Chronic pain of left elbow  09/07/2022   Posterior right knee pain 12/16/2020   Overweight (BMI 25.0-29.9) 06/30/2020   Insomnia 08/07/2018   Hyperlipidemia LDL goal <100 10/16/2017   Perennial allergic rhinitis 08/28/2017   Moderate persistent asthma, uncomplicated 08/28/2017   Hx of adenomatous colonic polyps    Lipoma 12/26/2015   Liver mass, left lobe 02/11/2015   Nasal polyposis 08/20/2014   Chronic nonallergic rhinitis 08/20/2014    Vitamin D  deficiency 07/12/2012   HTN (hypertension) 08/19/2011   GERD (gastroesophageal reflux disease) 04/11/2011   IBS 12/19/2010   Migraine 01/19/2010   IGT (impaired glucose tolerance) 12/26/2009   Asthma 11/15/2007    History obtained from: chart review and patient.  Discussed the use of AI scribe software for clinical note transcription with the patient and/or guardian, who gave verbal consent to proceed.  Hannah Hays is a 58 y.o. female presenting for skin testing. She was last seen on November 19th. She has been followed by us  for years, but she has not had allergy testing since before I started seeing her. She has been off of all antihistamines 3 days in anticipation of the testing.   She experiences itching at the site of her recent surgery on her upper back. The wound is no longer draining and has decreased in size. She changes her bandage daily but has not done so today.  She took some cough medicine the previous night but does not specify the type. She feels 'gross' but not 'super sick'. She has not been using her albuterol  frequently.   She has a reaction to pineapple, causing her mouth to sweat. She manages this by consuming only a small amount.  Otherwise, there have been no changes to her past medical history, surgical history, family history, or social history.    Review of systems otherwise negative other than that mentioned in the HPI.    Objective:   There were no vitals taken for this visit. There is no height or weight on file to calculate BMI.    Physical exam deferred since this was a skin testing appointment only.   Diagnostic studies:    Allergy Studies:     Airborne Adult Perc - 11/05/24 0843     Time Antigen Placed 0844    Allergen Manufacturer Jestine    Location Back    Number of Test 55    1. Control-Buffer 50% Glycerol Negative    2. Control-Histamine 3+    3. Bahia Negative    4. Bermuda Negative    5. Johnson Negative    6. [place]   Blue Negative    7. Meadow Fescue 2+    8. Perennial Rye Negative    9. Timothy Negative    10. Ragweed Mix Negative    11. Cocklebur Negative    12. Plantain,  English Negative    13. Baccharis Negative    14. Dog Fennel Negative    15. Russian Thistle Negative    16. Lamb's Quarters 2+    17. Sheep Sorrell 2+    18. Rough Pigweed Negative    19. Marsh Elder, Rough Negative    20. Mugwort, Common Negative    21. Box, Elder Negative    22. Cedar, red Negative    23. Sweet Gum Negative    24. Pecan Pollen Negative    25. Pine Mix Negative    26. Walnut, Black Pollen Negative    27. Red Mulberry Negative    28. Ash Mix --   +/-   29.  Birch Mix Negative    30. Beech American Negative    31. Cottonwood, Eastern Negative    32. Hickory, White Negative    33. Maple Mix Negative    34. Oak, Eastern Mix Negative    35. Sycamore Eastern Negative    36. Alternaria Alternata Negative    37. Cladosporium Herbarum Negative    38. Aspergillus Mix Negative    39. Penicillium Mix Negative    40. Bipolaris Sorokiniana (Helminthosporium) Negative    41. Drechslera Spicifera (Curvularia) Negative    42. Mucor Plumbeus Negative    43. Fusarium Moniliforme Negative    44. Aureobasidium Pullulans (pullulara) Negative    45. Rhizopus Oryzae Negative    46. Botrytis Cinera 2+    47. Epicoccum Nigrum Negative    48. Phoma Betae Negative    49. Dust Mite Mix 2+    50. Cat Hair 10,000 BAU/ml Negative    51.  Dog Epithelia Negative    52. Mixed Feathers Negative    53. Horse Epithelia Negative    54. Cockroach, German Negative    55. Tobacco Leaf Negative          Intradermal - 11/05/24 0914     Time Antigen Placed 0915    Allergen Manufacturer Jestine    Location Arm    Number of Test 13    Control Negative    Bahia Negative    Bermuda Negative    Johnson Negative    Ragweed Mix Negative    Tree Mix Negative    Mold 1 Negative    Mold 2 Negative    Mold 3 Negative    Mold 4 3+     Cat Negative    Dog Negative    Cockroach Negative          Food Adult Perc - 11/05/24 0800     Time Antigen Placed 9156    Allergen Manufacturer Jestine    Location Back    Number of allergen test 1    65. Pineapple --   3 x 5         Allergy testing results were read and interpreted by myself, documented by clinical staff.      Marty Shaggy, MD  [facility] 

## 2024-11-12 ENCOUNTER — Ambulatory Visit (INDEPENDENT_AMBULATORY_CARE_PROVIDER_SITE_OTHER)

## 2024-11-12 DIAGNOSIS — Z23 Encounter for immunization: Secondary | ICD-10-CM | POA: Diagnosis not present

## 2024-11-12 NOTE — Progress Notes (Signed)
 Patient is in office today for a nurse visit for Immunization. Patient Injection was given in the  Right deltoid. Patient tolerated injection well.

## 2025-01-07 ENCOUNTER — Ambulatory Visit (INDEPENDENT_AMBULATORY_CARE_PROVIDER_SITE_OTHER)

## 2025-01-07 DIAGNOSIS — Z23 Encounter for immunization: Secondary | ICD-10-CM | POA: Diagnosis not present

## 2025-01-07 NOTE — Progress Notes (Signed)
 Patient is in office today for a nurse visit for Immunization. Patient Injection was given in the  Right deltoid. Patient tolerated injection well.

## 2025-02-06 ENCOUNTER — Ambulatory Visit: Admitting: Family Medicine

## 2025-05-15 ENCOUNTER — Ambulatory Visit: Admitting: Family Medicine
